# Patient Record
Sex: Male | Born: 1960
Health system: Southern US, Community
[De-identification: ages and names within clinical notes are randomized; demographics above are authoritative.]

## PROBLEM LIST (undated history)

## (undated) DIAGNOSIS — I10 Essential (primary) hypertension: Secondary | ICD-10-CM

## (undated) DIAGNOSIS — E7800 Pure hypercholesterolemia, unspecified: Secondary | ICD-10-CM

## (undated) DIAGNOSIS — E119 Type 2 diabetes mellitus without complications: Secondary | ICD-10-CM

## (undated) HISTORY — PX: EYE SURGERY: SHX253

## (undated) HISTORY — PX: TONSILLECTOMY: SUR1361

---

## 2001-07-08 ENCOUNTER — Emergency Department (HOSPITAL_COMMUNITY): Admission: EM | Admit: 2001-07-08 | Discharge: 2001-07-08 | Payer: Self-pay | Admitting: Emergency Medicine

## 2003-08-08 ENCOUNTER — Emergency Department (HOSPITAL_COMMUNITY): Admission: EM | Admit: 2003-08-08 | Discharge: 2003-08-08 | Payer: Self-pay | Admitting: Emergency Medicine

## 2012-06-23 ENCOUNTER — Emergency Department (HOSPITAL_COMMUNITY)
Admission: EM | Admit: 2012-06-23 | Discharge: 2012-06-24 | Disposition: A | Discharge status: Home / Self Care | Payer: Self-pay | Attending: Emergency Medicine | Admitting: Emergency Medicine

## 2012-06-23 DIAGNOSIS — R079 Chest pain, unspecified: Secondary | ICD-10-CM | POA: Insufficient documentation

## 2012-06-24 ENCOUNTER — Encounter (HOSPITAL_COMMUNITY): Payer: Self-pay | Admitting: Emergency Medicine

## 2012-06-24 ENCOUNTER — Emergency Department (HOSPITAL_COMMUNITY): Payer: Self-pay

## 2012-06-24 LAB — CBC WITH DIFFERENTIAL/PLATELET
Basophils Absolute: 0 10*3/uL (ref 0.0–0.1)
Basophils Relative: 0 % (ref 0–1)
Eosinophils Absolute: 0.3 10*3/uL (ref 0.0–0.7)
HCT: 37.1 % — ABNORMAL LOW (ref 39.0–52.0)
MCH: 29.3 pg (ref 26.0–34.0)
MCHC: 34.2 g/dL (ref 30.0–36.0)
Monocytes Absolute: 0.7 10*3/uL (ref 0.1–1.0)
Neutrophils Relative %: 55 % (ref 43–77)
Platelets: 230 10*3/uL (ref 150–400)
RBC: 4.34 MIL/uL (ref 4.22–5.81)

## 2012-06-24 LAB — COMPREHENSIVE METABOLIC PANEL
Alkaline Phosphatase: 77 U/L (ref 39–117)
Glucose, Bld: 98 mg/dL (ref 70–99)
Potassium: 4 mEq/L (ref 3.5–5.1)
Sodium: 138 mEq/L (ref 135–145)
Total Bilirubin: 0.4 mg/dL (ref 0.3–1.2)

## 2012-06-24 LAB — POCT I-STAT TROPONIN I: Troponin i, poc: 0 ng/mL (ref 0.00–0.08)

## 2012-06-24 LAB — PRO B NATRIURETIC PEPTIDE: Pro B Natriuretic peptide (BNP): 21.1 pg/mL (ref 0–125)

## 2012-06-24 LAB — URINALYSIS, ROUTINE W REFLEX MICROSCOPIC
Glucose, UA: NEGATIVE mg/dL
Specific Gravity, Urine: 1.026 (ref 1.005–1.030)
Urobilinogen, UA: 1 mg/dL (ref 0.0–1.0)
pH: 7.5 (ref 5.0–8.0)

## 2012-06-24 NOTE — ED Notes (Signed)
Pt states that he has had intermittent chest pressure and SOB since last night. States that it is worse when he lies down. Hypertensive. No hx of such.

## 2012-06-24 NOTE — ED Provider Notes (Signed)
ECG shows normal sinus rhythm with a rate of 75, no ectopy. Normal axis. Normal P wave. Normal QRS. Normal intervals. Normal ST and T waves. Impression: normal ECG. No prior ECG available for comparison  Medical screening examination/treatment/procedure(s) were performed by non-physician practitioner and as supervising physician I was immediately available for consultation/collaboration.  Dione Booze, MD 06/24/12 [facility]

## 2012-06-24 NOTE — ED Provider Notes (Signed)
History     CSN: [id number]  Arrival date & time 06/23/12  2348   First MD Initiated Contact with Patient 06/24/12 0020      Chief Complaint  Patient presents with  . Chest Pain    (Consider location/radiation/quality/duration/timing/severity/associated sxs/prior treatment) HPI Comments: Patient is a 52 year old otherwise healthy male who presents for left-sided chest pressure. Patient endorses 2 episodes lasting approximately 10 minutes before spontaneously resolving the first of which began last night. Patient denies any radiation of his chest pain and states that the pain was made better when sitting upright and worse when lying supine. Patient denies associated fever, jaw pain, lightheadedness or dizziness, difficulty breathing, nausea or vomiting, abdominal pain, and numbness, tingling, or weakness in his extremities. Patient denies the use of blood thinners as well is a history of hypertension, coronary artery disease, and DM. Patient endorses a family history of hypertension in his mother, diagnosed at age 89.  Patient is a 52 y.o. male presenting with chest pain. The history is provided by the patient. No language interpreter was used.  Chest Pain Associated symptoms: no dizziness, no fever, no nausea, no numbness, no shortness of breath and not vomiting     History reviewed. No pertinent past medical history.  No past surgical history on file.  No family history on file.  History  Substance Use Topics  . Smoking status: Not on file  . Smokeless tobacco: Not on file  . Alcohol Use: Not on file     Review of Systems  Constitutional: Negative for fever.  Eyes: Negative for visual disturbance.  Respiratory: Positive for chest tightness. Negative for shortness of breath.   Cardiovascular: Positive for chest pain.  Gastrointestinal: Negative for nausea and vomiting.  Neurological: Negative for dizziness, light-headedness and numbness.  All other systems reviewed and are  negative.    Allergies  Review of patient's allergies indicates no known allergies.  Home Medications  No current outpatient prescriptions on file.  BP 159/83  Pulse 66  Temp(Src) 97.9 F (36.6 C) (Oral)  Resp 24  Ht 5\' 11"  (1.803 m)  Wt 230 lb (104.327 kg)  BMI 32.09 kg/m2  SpO2 97%  Physical Exam  Nursing note and vitals reviewed. Constitutional: He is oriented to person, place, and time. He appears well-developed and well-nourished. No distress.  HENT:  Head: Normocephalic and atraumatic.  Mouth/Throat: Oropharynx is clear and moist. No oropharyngeal exudate.  Eyes: Conjunctivae and EOM are normal. Pupils are equal, round, and reactive to light. No scleral icterus.  Neck: Normal range of motion. Neck supple.  Cardiovascular: Normal rate, regular rhythm, normal heart sounds and intact distal pulses.   Pulmonary/Chest: Effort normal and breath sounds normal. No respiratory distress. He has no wheezes. He has no rales.  Pain not reproducible on palpation  Abdominal: Soft. He exhibits no distension. There is no tenderness. There is no rebound and no guarding.  Musculoskeletal: Normal range of motion. He exhibits no edema.  Neurological: He is alert and oriented to person, place, and time.  Skin: Skin is warm and dry. No rash noted. He is not diaphoretic. No erythema.  Psychiatric: He has a normal mood and affect. His behavior is normal.    ED Course  Procedures (including critical care time)  Labs Reviewed  COMPREHENSIVE METABOLIC PANEL - Abnormal; Notable for the following:    Albumin 3.3 (*)    GFR calc non Af Amer 65 (*)    GFR calc Af Amer 75 (*)  All other components within normal limits  URINALYSIS, ROUTINE W REFLEX MICROSCOPIC - Abnormal; Notable for the following:    APPearance CLOUDY (*)    All other components within normal limits  CBC WITH DIFFERENTIAL - Abnormal; Notable for the following:    Hemoglobin 12.7 (*)    HCT 37.1 (*)    All other components  within normal limits  PRO B NATRIURETIC PEPTIDE  POCT I-STAT TROPONIN I  POCT I-STAT TROPONIN I   Dg Chest 2 View  06/24/2012   *RADIOLOGY REPORT*  Clinical Data: Chest pain  CHEST - 2 VIEW  Comparison: None.  Findings: Normal heart, mediastinal, and hilar contours.  Normal pulmonary vascularity.  Midline trachea.  The lungs are normally expanded and clear.  Negative for pleural effusion or pneumothorax. No acute osseous abnormality.  IMPRESSION: No acute cardiopulmonary disease.   Original Report Authenticated By: Britta Mccreedy, M.D.    Date: 06/24/2012  Rate: 75  Rhythm: normal sinus rhythm  QRS Axis: normal  Intervals: normal  ST/T Wave abnormalities: normal  Conduction Disutrbances:none  Narrative Interpretation: NSR without STEMI or ischemic changes.  Old EKG Reviewed: none available I have personally reviewed and interpreted this EKG.   1. Chest pain     MDM  52 y/o male who presents for chest pain. Otherwise healthy and not on any daily medications. Patient asymptomatic on initial presentation and remained asymptomatic during entirety of ED course. EKG without STEMI or ischemic changes and troponin 0.00 x 2. There is no leukocytosis, significant anemia, or electrolyte imbalance. Kidney and liver function preserved. BNP not elevated and CXR without evidence of PNA, PTX, pleural effusion, or other cardiopulmonary abnormality.   Low suspicion for ACS given work up in ED today, lack of chronic medical conditions, and atypical nature of symptoms. Patient has TIMI score of 0 correlated with 5% risk of ACS. Also doubt PE given lack of tachycardia, tachypnea, hypoxia, or risk factors; Well's PE score 0. Believe patient appropriate for d/c with PCP and cardiology follow up for further evaluation of symptoms given lack of evidence to suspect emergent process. Indications for ED return discussed and have also reviewed all lab results with the patient. Patient verbalizes comfort and understanding  with plan with no unaddressed concerns.        Antony Madura, PA-C 06/24/12 1948

## 2012-06-24 NOTE — ED Provider Notes (Signed)
Medical screening examination/treatment/procedure(s) were performed by non-physician practitioner and as supervising physician I was immediately available for consultation/collaboration.  Ahaana Rochette, MD 06/24/12 2253 

## 2013-10-13 ENCOUNTER — Telehealth: Payer: Self-pay | Admitting: Family Medicine

## 2013-10-13 NOTE — Telephone Encounter (Signed)
lm

## 2013-11-07 ENCOUNTER — Encounter: Payer: Self-pay | Admitting: Family Medicine

## 2014-12-01 ENCOUNTER — Emergency Department (HOSPITAL_COMMUNITY)
Admission: EM | Admit: 2014-12-01 | Discharge: 2014-12-01 | Disposition: A | Discharge status: Home / Self Care | Payer: Self-pay | Attending: Emergency Medicine | Admitting: Emergency Medicine

## 2014-12-01 ENCOUNTER — Encounter (HOSPITAL_COMMUNITY): Payer: Self-pay | Admitting: Emergency Medicine

## 2014-12-01 DIAGNOSIS — R141 Gas pain: Secondary | ICD-10-CM | POA: Insufficient documentation

## 2014-12-01 DIAGNOSIS — R197 Diarrhea, unspecified: Secondary | ICD-10-CM | POA: Insufficient documentation

## 2014-12-01 DIAGNOSIS — R112 Nausea with vomiting, unspecified: Secondary | ICD-10-CM | POA: Insufficient documentation

## 2014-12-01 LAB — CBC
HEMATOCRIT: 46.2 % (ref 39.0–52.0)
Hemoglobin: 16 g/dL (ref 13.0–17.0)
MCH: 30.5 pg (ref 26.0–34.0)
MCHC: 34.6 g/dL (ref 30.0–36.0)
MCV: 88.2 fL (ref 78.0–100.0)
PLATELETS: 265 10*3/uL (ref 150–400)
RBC: 5.24 MIL/uL (ref 4.22–5.81)
RDW: 13 % (ref 11.5–15.5)
WBC: 12.7 10*3/uL — AB (ref 4.0–10.5)

## 2014-12-01 LAB — URINALYSIS, ROUTINE W REFLEX MICROSCOPIC
BILIRUBIN URINE: NEGATIVE
Glucose, UA: NEGATIVE mg/dL
Hgb urine dipstick: NEGATIVE
KETONES UR: NEGATIVE mg/dL
LEUKOCYTES UA: NEGATIVE
NITRITE: NEGATIVE
PROTEIN: 30 mg/dL — AB
Specific Gravity, Urine: 1.03 (ref 1.005–1.030)
pH: 5.5 (ref 5.0–8.0)

## 2014-12-01 LAB — COMPREHENSIVE METABOLIC PANEL
ALBUMIN: 4.5 g/dL (ref 3.5–5.0)
ALK PHOS: 78 U/L (ref 38–126)
ALT: 32 U/L (ref 17–63)
AST: 28 U/L (ref 15–41)
Anion gap: 10 (ref 5–15)
BILIRUBIN TOTAL: 0.9 mg/dL (ref 0.3–1.2)
BUN: 18 mg/dL (ref 6–20)
CALCIUM: 9.7 mg/dL (ref 8.9–10.3)
CO2: 25 mmol/L (ref 22–32)
Chloride: 103 mmol/L (ref 101–111)
Creatinine, Ser: 1.41 mg/dL — ABNORMAL HIGH (ref 0.61–1.24)
GFR calc Af Amer: >60 mL/min (ref >60)
GFR, EST NON AFRICAN AMERICAN: 54 mL/min — AB (ref >60)
GLUCOSE: 116 mg/dL — AB (ref 65–99)
POTASSIUM: 3.7 mmol/L (ref 3.5–5.1)
Sodium: 138 mmol/L (ref 135–145)
TOTAL PROTEIN: 8.7 g/dL — AB (ref 6.5–8.1)

## 2014-12-01 LAB — URINE MICROSCOPIC-ADD ON

## 2014-12-01 LAB — LIPASE, BLOOD: Lipase: 30 U/L (ref 11–51)

## 2014-12-01 MED ORDER — ONDANSETRON HCL 4 MG/2ML IJ SOLN: 4.0000 mg | Freq: Once | INTRAMUSCULAR | Status: DC

## 2014-12-01 MED ORDER — SODIUM CHLORIDE 0.9 % IV SOLN: Freq: Once | INTRAVENOUS | Status: DC

## 2014-12-01 MED ORDER — DICYCLOMINE HCL 10 MG/ML IM SOLN
20.0000 mg | Freq: Once | INTRAMUSCULAR | Status: AC
  Administered 2014-12-01: 20 mg via INTRAMUSCULAR
  Filled 2014-12-01: qty 2

## 2014-12-01 MED ORDER — ONDANSETRON 8 MG PO TBDP: ORAL_TABLET | ORAL | Status: DC

## 2014-12-01 MED ORDER — ONDANSETRON HCL 4 MG/2ML IJ SOLN
4.0000 mg | Freq: Once | INTRAMUSCULAR | Status: AC
  Administered 2014-12-01: 4 mg via INTRAVENOUS
  Filled 2014-12-01: qty 2

## 2014-12-01 MED ORDER — SODIUM CHLORIDE 0.9 % IV BOLUS (SEPSIS)
500.0000 mL | Freq: Once | INTRAVENOUS | Status: AC
  Administered 2014-12-01: 500 mL via INTRAVENOUS

## 2014-12-01 NOTE — ED Notes (Signed)
Patient presents stating he started with N/V about 5 hours ago and last had any vomiting approx 1 hour PTA.  States he was working and all of a sudden started with abd pain.  States the pain is in the middle of his abd.

## 2014-12-01 NOTE — ED Notes (Signed)
Reviewed d/c instructions with pt, who voiced understanding. Pt departed under his own power and in NAD.  

## 2014-12-01 NOTE — ED Notes (Signed)
Patient arrived to room.

## 2014-12-01 NOTE — ED Notes (Signed)
Pt. reports mid abdominal pain with nausea , emesis and diarrhea onset this evening , denies fever or chills .

## 2014-12-01 NOTE — ED Notes (Signed)
Patient denies urinary symptoms.

## 2014-12-01 NOTE — ED Provider Notes (Signed)
CSN: [id number]     Arrival date & time 12/01/14  0050 History  By signing my name below, I, Phillis Haggis, attest that this documentation has been prepared under the direction and in the presence of Anum Palecek, MD. Electronically Signed: Phillis Haggis, ED Scribe. 12/01/2014. 2:01 AM.   Chief Complaint  Patient presents with  . Abdominal Pain   Patient is a 54 y.o. male presenting with abdominal pain. The history is provided by the patient. No language interpreter was used.  Abdominal Pain Pain location: middle. Pain quality: cramping   Pain radiates to:  Does not radiate Pain severity:  Mild Onset quality:  Sudden Duration:  5 hours Timing:  Constant Progression:  Worsening Chronicity:  New Context: not diet changes and not eating   Relieved by:  Nothing Worsened by:  Nothing tried Ineffective treatments:  None tried Associated symptoms: diarrhea, nausea and vomiting   Associated symptoms: no chest pain, no chills, no cough, no fever and no shortness of breath   Risk factors: no alcohol abuse   HPI Comments: Johnson Arizola is a 54 y.o. Male who presents to the Emergency Department complaining of constant, gradually worsening, non-radiating middle abdominal pain onset 5 hours ago. Pt reports associated nausea, vomiting x5 and diarrhea x5. He states that his last episodes of vomiting and diarrhea were at 11:30 PM. He denies sick contacts. He denies fever or chills.   History reviewed. No pertinent past medical history. Past Surgical History  Procedure Laterality Date  . Tonsillectomy    . Eye surgery     No family history on file. Social History  Substance Use Topics  . Smoking status: Never Smoker   . Smokeless tobacco: None  . Alcohol Use: No    Review of Systems  Constitutional: Negative for fever and chills.  Respiratory: Negative for cough and shortness of breath.   Cardiovascular: Negative for chest pain.  Gastrointestinal: Positive for nausea, vomiting,  abdominal pain and diarrhea. Negative for anal bleeding.  All other systems reviewed and are negative.   Allergies  Review of patient's allergies indicates no known allergies.  Home Medications   Prior to Admission medications   Not on File   BP 165/102 mmHg  Pulse 100  Temp(Src) 98.3 F (36.8 C) (Oral)  Resp 16  SpO2 100% Physical Exam  Constitutional: He is oriented to person, place, and time. He appears well-developed and well-nourished.  HENT:  Head: Normocephalic and atraumatic.  Mouth/Throat: Oropharynx is clear and moist. No oropharyngeal exudate.  Eyes: EOM are normal. Pupils are equal, round, and reactive to light.  Neck: Normal range of motion. Neck supple.  Cardiovascular: Normal rate, regular rhythm and normal heart sounds.  Exam reveals no gallop and no friction rub.   No murmur heard. Pulmonary/Chest: Effort normal and breath sounds normal. He has no wheezes. He has no rales.  Abdominal: Soft. He exhibits no distension and no mass. There is no tenderness. There is no rebound and no guarding.  Gassy  Musculoskeletal: Normal range of motion.  Neurological: He is alert and oriented to person, place, and time. He has normal reflexes.  Skin: Skin is warm and dry.  Psychiatric: He has a normal mood and affect. His behavior is normal.  Nursing note and vitals reviewed.   ED Course  Procedures (including critical care time) DIAGNOSTIC STUDIES: Oxygen Saturation is 100% on RA, by my interpretation.    COORDINATION OF CARE: 1:18 AM-Discussed treatment plan which includes labs with pt at bedside  and pt agreed to plan.   Labs Review Labs Reviewed  COMPREHENSIVE METABOLIC PANEL - Abnormal; Notable for the following:    Glucose, Bld 116 (*)    Creatinine, Ser 1.41 (*)    Total Protein 8.7 (*)    GFR calc non Af Amer 54 (*)    All other components within normal limits  CBC - Abnormal; Notable for the following:    WBC 12.7 (*)    All other components within  normal limits  URINALYSIS, ROUTINE W REFLEX MICROSCOPIC (NOT AT [facility]) - Abnormal; Notable for the following:    Protein, ur 30 (*)    All other components within normal limits  URINE MICROSCOPIC-ADD ON - Abnormal; Notable for the following:    Squamous Epithelial / LPF 0-5 (*)    Bacteria, UA RARE (*)    All other components within normal limits  LIPASE, BLOOD    Imaging Review No results found. I have personally reviewed and evaluated these images and lab results as part of my medical decision-making.   EKG Interpretation None      MDM   Final diagnoses:  None    Symptoms consistent with viral n/v/d.  Exam and vitals are benign and reassuring.  Strict return precautions given.  Hand hygine and follow up in 2 days with your PMD  I personally performed the services described in this documentation, which was scribed in my presence. The recorded information has been reviewed and is accurate.      Cy BlamerApril Daniyal Tabor, MD 12/01/14 [phone]

## 2015-02-12 ENCOUNTER — Ambulatory Visit: Payer: Self-pay | Admitting: Internal Medicine

## 2015-03-04 ENCOUNTER — Ambulatory Visit: Payer: Self-pay | Attending: Family Medicine | Admitting: Family Medicine

## 2015-03-04 ENCOUNTER — Encounter: Payer: Self-pay | Admitting: Family Medicine

## 2015-03-04 VITALS — BP 170/110 | HR 86 | Temp 97.4°F | Resp 16 | Ht 66.0 in | Wt 273.4 lb

## 2015-03-04 DIAGNOSIS — Z79899 Other long term (current) drug therapy: Secondary | ICD-10-CM | POA: Insufficient documentation

## 2015-03-04 DIAGNOSIS — IMO0001 Reserved for inherently not codable concepts without codable children: Secondary | ICD-10-CM

## 2015-03-04 DIAGNOSIS — M545 Low back pain: Secondary | ICD-10-CM | POA: Insufficient documentation

## 2015-03-04 DIAGNOSIS — M62830 Muscle spasm of back: Secondary | ICD-10-CM | POA: Insufficient documentation

## 2015-03-04 DIAGNOSIS — I10 Essential (primary) hypertension: Secondary | ICD-10-CM | POA: Insufficient documentation

## 2015-03-04 DIAGNOSIS — R030 Elevated blood-pressure reading, without diagnosis of hypertension: Secondary | ICD-10-CM | POA: Insufficient documentation

## 2015-03-04 MED ORDER — TIZANIDINE HCL 4 MG PO TABS: 4.0000 mg | ORAL_TABLET | Freq: Three times a day (TID) | ORAL | Status: DC | PRN

## 2015-03-04 NOTE — Progress Notes (Signed)
Patient reports back pain only when he walks distances and then his lower back hurts-it is non-radiating He takes no medications at this time

## 2015-03-04 NOTE — Patient Instructions (Signed)
Hypertension Hypertension, commonly called high blood pressure, is when the force of blood pumping through your arteries is too strong. Your arteries are the blood vessels that carry blood from your heart throughout your body. A blood pressure reading consists of a higher number over a lower number, such as 110/72. The higher number (systolic) is the pressure inside your arteries when your heart pumps. The lower number (diastolic) is the pressure inside your arteries when your heart relaxes. Ideally you want your blood pressure below 120/80. Hypertension forces your heart to work harder to pump blood. Your arteries may become narrow or stiff. Having untreated or uncontrolled hypertension can cause heart attack, stroke, kidney disease, and other problems. RISK FACTORS Some risk factors for high blood pressure are controllable. Others are not.  Risk factors you cannot control include:   Race. You may be at higher risk if you are African American.  Age. Risk increases with age.  Gender. Men are at higher risk than women before age 45 years. After age 65, women are at higher risk than men. Risk factors you can control include:  Not getting enough exercise or physical activity.  Being overweight.  Getting too much fat, sugar, calories, or salt in your diet.  Drinking too much alcohol. SIGNS AND SYMPTOMS Hypertension does not usually cause signs or symptoms. Extremely high blood pressure (hypertensive crisis) may cause headache, anxiety, shortness of breath, and nosebleed. DIAGNOSIS To check if you have hypertension, your health care provider will measure your blood pressure while you are seated, with your arm held at the level of your heart. It should be measured at least twice using the same arm. Certain conditions can cause a difference in blood pressure between your right and left arms. A blood pressure reading that is higher than normal on one occasion does not mean that you need treatment. If  it is not clear whether you have high blood pressure, you may be asked to return on a different day to have your blood pressure checked again. Or, you may be asked to monitor your blood pressure at home for 1 or more weeks. TREATMENT Treating high blood pressure includes making lifestyle changes and possibly taking medicine. Living a healthy lifestyle can help lower high blood pressure. You may need to change some of your habits. Lifestyle changes may include:  Following the DASH diet. This diet is high in fruits, vegetables, and whole grains. It is low in salt, red meat, and added sugars.  Keep your sodium intake below 2,300 mg per day.  Getting at least 30-45 minutes of aerobic exercise at least 4 times per week.  Losing weight if necessary.  Not smoking.  Limiting alcoholic beverages.  Learning ways to reduce stress. Your health care provider may prescribe medicine if lifestyle changes are not enough to get your blood pressure under control, and if one of the following is true:  You are 18-59 years of age and your systolic blood pressure is above 140.  You are 60 years of age or older, and your systolic blood pressure is above 150.  Your diastolic blood pressure is above 90.  You have diabetes, and your systolic blood pressure is over 140 or your diastolic blood pressure is over 90.  You have kidney disease and your blood pressure is above 140/90.  You have heart disease and your blood pressure is above 140/90. Your personal target blood pressure may vary depending on your medical conditions, your age, and other factors. HOME CARE INSTRUCTIONS    Have your blood pressure rechecked as directed by your health care provider.   Take medicines only as directed by your health care provider. Follow the directions carefully. Blood pressure medicines must be taken as prescribed. The medicine does not work as well when you skip doses. Skipping doses also puts you at risk for  problems.  Do not smoke.   Monitor your blood pressure at home as directed by your health care provider. SEEK MEDICAL CARE IF:   You think you are having a reaction to medicines taken.  You have recurrent headaches or feel dizzy.  You have swelling in your ankles.  You have trouble with your vision. SEEK IMMEDIATE MEDICAL CARE IF:  You develop a severe headache or confusion.  You have unusual weakness, numbness, or feel faint.  You have severe chest or abdominal pain.  You vomit repeatedly.  You have trouble breathing. MAKE SURE YOU:   Understand these instructions.  Will watch your condition.  Will get help right away if you are not doing well or get worse.   This information is not intended to replace advice given to you by your health care provider. Make sure you discuss any questions you have with your health care provider.   Document Released: 12/26/2004 Document Revised: 05/12/2014 Document Reviewed: 10/18/2012 Elsevier Interactive Patient Education 2016 [employer]  

## 2015-03-05 NOTE — Progress Notes (Signed)
   Subjective:  Patient ID: Lonnie Brown, male    DOB: 04-Jul-1960  Age: 55 y.o. MRN: [id number]  CC: Establish Care   HPI Lonnie Brown is a 55 year old male who presents with intermittent low back pain since 2012 with no history of trauma which is aggravated by walking and relieved by sitting down; he denies radiation of pain to his lower extremity and has no numbness or weakness in his legs and denies any loss of sphincteric function. Pain occurs on bilateral aspects of his lower back.  His blood pressure is elevated today and he denies a history of hypertension and has not been on any antihypertensives in the past. Denies chest pain or shortness of breath.  Outpatient Prescriptions Prior to Visit  Medication Sig Dispense Refill  . ondansetron (ZOFRAN ODT) 8 MG disintegrating tablet  ODT q8 hours prn nausea 4 tablet 0   No facility-administered medications prior to visit.    ROS Review of Systems  Constitutional: Negative for activity change and appetite change.  HENT: Negative for sinus pressure and sore throat.   Respiratory: Negative for chest tightness, shortness of breath and wheezing.   Cardiovascular: Negative for chest pain and palpitations.  Gastrointestinal: Negative for abdominal pain, constipation and abdominal distention.  Genitourinary: Negative.   Musculoskeletal: Positive for back pain. Negative for gait problem.  Psychiatric/Behavioral: Negative for behavioral problems and dysphoric mood.    Objective:  BP 170/110 mmHg  Pulse 86  Temp(Src) 97.4 F (36.3 C)  Resp 16  Ht  (1.676 m)  Wt 273 lb 6.4 oz (124.013 kg)  BMI 44.15 kg/m2  SpO2 95%  BP/Weight 03/04/2015 12/01/2014 06/24/2012  Systolic BP 170 159 154  Diastolic BP 110 107 89  Wt. (Lbs) 273.4 - -  BMI 44.15 - -      Physical Exam  Constitutional: He is oriented to person, place, and time. He appears well-developed and well-nourished.  Cardiovascular: Normal rate, normal heart  sounds and intact distal pulses.   No murmur heard. Pulmonary/Chest: Effort normal and breath sounds normal. He has no wheezes. He has no rales. He exhibits no tenderness.  Abdominal: Soft. Bowel sounds are normal. He exhibits no distension and no mass. There is no tenderness.  Musculoskeletal: He exhibits tenderness (tenderness on palpation of bilateral lumbar paraspinal region).  Neurological: He is alert and oriented to person, place, and time.     Assessment & Plan:   1. Elevated blood pressure Uncontrolled. Advised on lifestyle modification and I will review his blood pressure at his next visit  2. Muscle spasm of back Advised to apply heat and stretching exercises demonstrated. - tiZANidine (ZANAFLEX) 4 MG tablet; Take 1 tablet (4 mg total) by mouth every 8 (eight) hours as needed for muscle spasms.  Dispense: 90 tablet; Refill: 1   Meds ordered this encounter  Medications  . tiZANidine (ZANAFLEX) 4 MG tablet    Sig: Take 1 tablet (4 mg total) by mouth every 8 (eight) hours as needed for muscle spasms.    Dispense:  90 tablet    Refill:  1    Follow-up: Return in about 1 week (around 03/11/2015) for Follow-up on elevated blood pressure and back pain.   Jaclyn Shaggy MD

## 2015-04-09 ENCOUNTER — Ambulatory Visit: Payer: Self-pay | Attending: Family Medicine | Admitting: Family Medicine

## 2015-04-09 ENCOUNTER — Encounter: Payer: Self-pay | Admitting: Family Medicine

## 2015-04-09 VITALS — BP 150/90 | HR 86 | Temp 98.2°F | Resp 16 | Ht 71.0 in | Wt 282.0 lb

## 2015-04-09 DIAGNOSIS — M7661 Achilles tendinitis, right leg: Secondary | ICD-10-CM | POA: Insufficient documentation

## 2015-04-09 DIAGNOSIS — M7662 Achilles tendinitis, left leg: Secondary | ICD-10-CM | POA: Insufficient documentation

## 2015-04-09 DIAGNOSIS — Z79899 Other long term (current) drug therapy: Secondary | ICD-10-CM | POA: Insufficient documentation

## 2015-04-09 DIAGNOSIS — I10 Essential (primary) hypertension: Secondary | ICD-10-CM | POA: Insufficient documentation

## 2015-04-09 DIAGNOSIS — M7660 Achilles tendinitis, unspecified leg: Secondary | ICD-10-CM | POA: Insufficient documentation

## 2015-04-09 MED ORDER — NAPROXEN 500 MG PO TABS: 500.0000 mg | ORAL_TABLET | Freq: Two times a day (BID) | ORAL | Status: DC

## 2015-04-09 MED ORDER — LISINOPRIL-HYDROCHLOROTHIAZIDE 10-12.5 MG PO TABS: 1.0000 | ORAL_TABLET | Freq: Every day | ORAL | Status: DC

## 2015-04-09 MED FILL — NAPROXEN 500 MG TABLET: 500 | 30 days supply | Qty: 30 | Fill #0

## 2015-04-09 MED FILL — LISINOPRIL-HCTZ 10-12.5 MG: 10-12.5 | 30 days supply | Qty: 30 | Fill #0

## 2015-04-09 MED FILL — tiZANidine HCL 4 MG TABS: 4 | 30 days supply | Qty: 90 | Fill #0

## 2015-04-09 NOTE — Progress Notes (Signed)
F/u Lt ankle  Stated Rt ankle better  Difficulty to walk  Pain scale #0  No suicidal thoughts in the past two weeks No tobacco user

## 2015-04-12 NOTE — Progress Notes (Signed)
   Subjective:  Patient ID: Lonnie Brown, male    DOB: [birth year]  Age: 55 y.o. MRN: [id number]  CC: Follow-up   HPI Lonnie PoundsMichael Brown presents for follow-up of elevated blood pressure after working on lifestyle changes and blood pressure still remains elevated. He also complains of bilateral ankle pain worse on the left and located in the posterior ankle. This sometimes affects his ambulation but he denies history of trauma and has no swelling. The back pain he complained of at his last visit has subsided.  Outpatient Prescriptions Prior to Visit  Medication Sig Dispense Refill  . tiZANidine (ZANAFLEX) 4 MG tablet Take 1 tablet (4 mg total) by mouth every 8 (eight) hours as needed for muscle spasms. 90 tablet 1   No facility-administered medications prior to visit.    ROS Review of Systems  Constitutional: Negative for activity change and appetite change.  HENT: Negative for sinus pressure and sore throat.   Respiratory: Negative for chest tightness, shortness of breath and wheezing.   Cardiovascular: Negative for chest pain and palpitations.  Gastrointestinal: Negative for abdominal pain, constipation and abdominal distention.  Genitourinary: Negative.   Musculoskeletal:       See hpi  Psychiatric/Behavioral: Negative for behavioral problems and dysphoric mood.    Objective:  BP 150/90 mmHg  Pulse 86  Temp(Src) 98.2 F (36.8 C) (Oral)  Resp 16  Ht 5\' 11"  (1.803 m)  Wt 282 lb (127.914 kg)  BMI 39.35 kg/m2  SpO2 97%  BP/Weight 04/09/2015 03/04/2015 12/01/2014  Systolic BP 150 170 159  Diastolic BP 90 110 107  Wt. (Lbs) 282 273.4 -  BMI 39.35 44.15 -      Physical Exam  Constitutional: He is oriented to person, place, and time. He appears well-developed and well-nourished.  Cardiovascular: Normal rate, normal heart sounds and intact distal pulses.   No murmur heard. Pulmonary/Chest: Effort normal and breath sounds normal. He has no wheezes. He has no rales. He  exhibits no tenderness.  Abdominal: Soft. Bowel sounds are normal. He exhibits no distension and no mass. There is no tenderness.  Musculoskeletal: Normal range of motion. He exhibits tenderness (mild tenderness on palpation of left achilles tendon and on dorsiflexion).  Neurological: He is alert and oriented to person, place, and time.     Assessment & Plan:   1. Essential hypertension Lifestyle modification ineffective so far Will commence antihypertensive - lisinopril-hydrochlorothiazide (PRINZIDE,ZESTORETIC) 10-12.5 MG tablet; Take 1 tablet by mouth daily.  Dispense: 30 tablet; Refill: 2  2. Achilles tendinitis of both lower extremities - naproxen (NAPROSYN) 500 MG tablet; Take 1 tablet (500 mg total) by mouth 2 (two) times daily with a meal.  Dispense: 30 tablet; Refill: 2   Meds ordered this encounter  Medications  . naproxen (NAPROSYN) 500 MG tablet    Sig: Take 1 tablet (500 mg total) by mouth 2 (two) times daily with a meal.    Dispense:  30 tablet    Refill:  2  . lisinopril-hydrochlorothiazide (PRINZIDE,ZESTORETIC) 10-12.5 MG tablet    Sig: Take 1 tablet by mouth daily.    Dispense:  30 tablet    Refill:  2    Follow-up: Return in about 2 weeks (around 04/23/2015) for follow up on hypertension and achilles tendinitis.   Jaclyn ShaggyEnobong Amao MD

## 2015-04-19 ENCOUNTER — Encounter (HOSPITAL_COMMUNITY): Payer: Self-pay | Admitting: *Deleted

## 2015-04-19 ENCOUNTER — Emergency Department (HOSPITAL_COMMUNITY)
Admission: EM | Admit: 2015-04-19 | Discharge: 2015-04-19 | Disposition: A | Discharge status: Home / Self Care | Payer: Self-pay | Attending: Emergency Medicine | Admitting: Emergency Medicine

## 2015-04-19 ENCOUNTER — Emergency Department (HOSPITAL_COMMUNITY): Payer: Self-pay

## 2015-04-19 DIAGNOSIS — Z791 Long term (current) use of non-steroidal anti-inflammatories (NSAID): Secondary | ICD-10-CM | POA: Insufficient documentation

## 2015-04-19 DIAGNOSIS — Z79899 Other long term (current) drug therapy: Secondary | ICD-10-CM | POA: Insufficient documentation

## 2015-04-19 DIAGNOSIS — R0602 Shortness of breath: Secondary | ICD-10-CM | POA: Insufficient documentation

## 2015-04-19 LAB — CBC
HEMATOCRIT: 38.3 % — AB (ref 39.0–52.0)
HEMOGLOBIN: 13.1 g/dL (ref 13.0–17.0)
MCH: 29.6 pg (ref 26.0–34.0)
MCHC: 34.2 g/dL (ref 30.0–36.0)
MCV: 86.7 fL (ref 78.0–100.0)
Platelets: 229 10*3/uL (ref 150–400)
RBC: 4.42 MIL/uL (ref 4.22–5.81)
RDW: 12.7 % (ref 11.5–15.5)
WBC: 6.9 10*3/uL (ref 4.0–10.5)

## 2015-04-19 LAB — BASIC METABOLIC PANEL
ANION GAP: 7 (ref 5–15)
BUN: 15 mg/dL (ref 6–20)
CO2: 26 mmol/L (ref 22–32)
Calcium: 9 mg/dL (ref 8.9–10.3)
Chloride: 103 mmol/L (ref 101–111)
Creatinine, Ser: 1.45 mg/dL — ABNORMAL HIGH (ref 0.61–1.24)
GFR, EST NON AFRICAN AMERICAN: 52 mL/min — AB (ref >60)
GLUCOSE: 111 mg/dL — AB (ref 65–99)
Potassium: 4.2 mmol/L (ref 3.5–5.1)
SODIUM: 136 mmol/L (ref 135–145)

## 2015-04-19 LAB — I-STAT TROPONIN, ED: Troponin i, poc: 0 ng/mL (ref 0.00–0.08)

## 2015-04-19 MED ORDER — IPRATROPIUM-ALBUTEROL 0.5-2.5 (3) MG/3ML IN SOLN
3.0000 mL | Freq: Once | RESPIRATORY_TRACT | Status: AC
  Administered 2015-04-19: 3 mL via RESPIRATORY_TRACT
  Filled 2015-04-19: qty 3

## 2015-04-19 NOTE — ED Provider Notes (Signed)
CSN: [id number]     Arrival date & time 04/19/15  [id number] History   First MD Initiated Contact with Patient 04/19/15 [phone]     Chief Complaint  Patient presents with  . Shortness of Breath    HPI   54 from [place] presents today with complaints of shortness of breath. Patient reports that starting last night when laying back he's had shortness of breath. He notes that upon sitting up standing and ambulating he feels normal with no chest pain, shortness of breath, diaphoresis. Patient reports he is able to lay back approximately 45 with no discomfort, but notes symptom onset with lying flat. Patient denies any history of the same, denies any cardiac history, denies any history of DVT or PE, heart failure, or any other significant health conditions. Patient denies any lower extremity swelling or edema, edema to his hands face mouth or throat. Patient denies any recent infectious illness, denies fever, chills, nausea, vomiting, cough. Patient reports he's never smoked, no history of hyperlipidemia, nondiabetic.   History reviewed. No pertinent past medical history. Past Surgical History  Procedure Laterality Date  . Tonsillectomy    . Eye surgery     Family History  Problem Relation Age of Onset  . Diabetes Mother   . Hypertension Mother    Social History  Substance Use Topics  . Smoking status: Never Smoker   . Smokeless tobacco: None  . Alcohol Use: No    Review of Systems  All other systems reviewed and are negative.   Allergies  Banana  Home Medications   Prior to Admission medications   Medication Sig Start Date End Date Taking? Authorizing Provider  lisinopril-hydrochlorothiazide (PRINZIDE,ZESTORETIC) 10-12.5 MG tablet Take 1 tablet by mouth daily. 04/09/15  Yes Jaclyn ShaggyEnobong Amao, MD  naproxen (NAPROSYN) 500 MG tablet Take 1 tablet (500 mg total) by mouth 2 (two) times daily with a meal. 04/09/15  Yes Jaclyn ShaggyEnobong Amao, MD  tiZANidine (ZANAFLEX) 4 MG tablet Take 1 tablet (4 mg total) by  mouth every 8 (eight) hours as needed for muscle spasms. 03/04/15  Yes Jaclyn ShaggyEnobong Amao, MD   BP 146/95 mmHg  Pulse 76  Temp(Src) 98.1 F (36.7 C) (Oral)  Resp 20  Ht 5\' 11"  (1.803 m)  Wt 127.007 kg  BMI 39.07 kg/m2  SpO2 99%   Physical Exam  Constitutional: He is oriented to person, place, and time. He appears well-developed and well-nourished.  HENT:  Head: Normocephalic and atraumatic.  Eyes: Conjunctivae are normal. Pupils are equal, round, and reactive to light. Right eye exhibits no discharge. Left eye exhibits no discharge. No scleral icterus.  Neck: Normal range of motion. No JVD present. No tracheal deviation present.  Cardiovascular: Normal rate, regular rhythm, normal heart sounds and intact distal pulses.  Exam reveals no gallop and no friction rub.   No murmur heard. Pulmonary/Chest: Effort normal and breath sounds normal. No stridor. No respiratory distress. He has no wheezes. He has no rales. He exhibits no tenderness.  Abdominal: Soft. He exhibits no distension and no mass. There is no tenderness. There is no rebound and no guarding.  Musculoskeletal:  Scant lower extremity edema bilaterally  Neurological: He is alert and oriented to person, place, and time. Coordination normal.  Skin: Skin is warm and dry. No rash noted. No erythema. No pallor.  Psychiatric: He has a normal mood and affect. His behavior is normal. Judgment and thought content normal.  Nursing note and vitals reviewed.   ED Course  Procedures (including critical  care time) Labs Review Labs Reviewed  BASIC METABOLIC PANEL - Abnormal; Notable for the following:    Glucose, Bld 111 (*)    Creatinine, Ser 1.45 (*)    GFR calc non Af Amer 52 (*)    All other components within normal limits  CBC - Abnormal; Notable for the following:    HCT 38.3 (*)    All other components within normal limits  I-STAT TROPOININ, ED    Imaging Review Dg Chest 2 View  04/19/2015  CLINICAL DATA:  Shortness of breath  when laying down for 3 days EXAM: CHEST  2 VIEW COMPARISON:  06/24/2012 FINDINGS: The heart size and mediastinal contours are within normal limits. Both lungs are clear. The visualized skeletal structures are unremarkable. IMPRESSION: No active cardiopulmonary disease. Electronically Signed   By: Charlett Nose M.D.   On: 04/19/2015 07:23   I have personally reviewed and evaluated these images and lab results as part of my medical decision-making.   EKG Interpretation   Date/Time:  Monday April 19 2015 16:10:96 EDT Ventricular Rate:  76 PR Interval:  168 QRS Duration: 108 QT Interval:  382 QTC Calculation: 429 R Axis:   -20 Text Interpretation:  Normal sinus rhythm Inferior infarct , age  undetermined Possible Anterior infarct , age undetermined Abnormal ECG No  significant change since last tracing in 2014 Confirmed by WARD,  DO,  KRISTEN ([id number]) on 04/19/2015 6:45:06 AM      MDM   Final diagnoses:  Shortness of breath    Labs: i-STAT troponin, BMP, CBC  Imaging: DG chest 2 view  Consults:  Therapeutics:  Discharge Meds:   Assessment/Plan:55 year old male presents today with shortness of breath. Patient has no appreciable shortness of breath on my exam. I was unable to reproduce patient's symptoms here in the ED even with lying flat. Patient was given a breathing treatment. Patient has no signs of heart failure, ACS, dissection, pulmonary embolism, or any other significant injury life-threatening illnesses. Patient's symptoms could likely be a result from body habitus, indigestion. Patient will be instructed to follow-up with his primary care for reevaluation of symptoms return, return to the emergency room if any concerning signs or symptoms present. Patient verbalizes understanding and agreement to today's plan had no further questions or concerns at time of discharge        Eyvonne Mechanic, PA-C 04/19/15 0454  Azalia Bilis, MD 04/21/15 [phone]

## 2015-04-19 NOTE — ED Notes (Signed)
Pt c/o sob all night when he lay down. No chest pain  Alert no distress

## 2015-04-19 NOTE — Discharge Instructions (Signed)

## 2015-04-26 ENCOUNTER — Ambulatory Visit: Payer: Self-pay | Admitting: Family Medicine

## 2015-05-03 ENCOUNTER — Encounter: Payer: Self-pay | Admitting: Family Medicine

## 2015-05-03 ENCOUNTER — Ambulatory Visit: Payer: Self-pay | Attending: Family Medicine | Admitting: Family Medicine

## 2015-05-03 VITALS — BP 142/91 | HR 76 | Temp 97.9°F | Resp 16 | Ht 71.0 in | Wt 286.4 lb

## 2015-05-03 DIAGNOSIS — M7661 Achilles tendinitis, right leg: Secondary | ICD-10-CM | POA: Insufficient documentation

## 2015-05-03 DIAGNOSIS — M7662 Achilles tendinitis, left leg: Secondary | ICD-10-CM | POA: Insufficient documentation

## 2015-05-03 DIAGNOSIS — M62830 Muscle spasm of back: Secondary | ICD-10-CM | POA: Insufficient documentation

## 2015-05-03 DIAGNOSIS — Z79899 Other long term (current) drug therapy: Secondary | ICD-10-CM | POA: Insufficient documentation

## 2015-05-03 DIAGNOSIS — I10 Essential (primary) hypertension: Secondary | ICD-10-CM | POA: Insufficient documentation

## 2015-05-03 MED ORDER — NAPROXEN 500 MG PO TABS: 500.0000 mg | ORAL_TABLET | Freq: Two times a day (BID) | ORAL | Status: DC

## 2015-05-03 MED ORDER — TIZANIDINE HCL 4 MG PO TABS: 4.0000 mg | ORAL_TABLET | Freq: Three times a day (TID) | ORAL | Status: DC | PRN

## 2015-05-03 MED ORDER — LISINOPRIL-HYDROCHLOROTHIAZIDE 20-25 MG PO TABS: 1.0000 | ORAL_TABLET | Freq: Every day | ORAL | Status: DC

## 2015-05-03 NOTE — Progress Notes (Signed)
Subjective:  Patient ID: Lonnie Brown, male    DOB: 02/17/1960  Age: 55 y.o. MRN: [id number]  CC: Back Pain and Ankle Pain   HPI Lonnie PoundsMichael Laser presents for presents for follow-up of elevated blood pressure after working on lifestyle changes and blood pressure still remains mildly elevated.  He is currently on NSAIDS for bilateral achilles tendinitis and a muscle relaxant for his back. The back pain he complained of at his last visit has subsided and left achilles tendon has improved.  Outpatient Prescriptions Prior to Visit  Medication Sig Dispense Refill  . lisinopril-hydrochlorothiazide (PRINZIDE,ZESTORETIC) 10-12.5 MG tablet Take 1 tablet by mouth daily. 30 tablet 2  . naproxen (NAPROSYN) 500 MG tablet Take 1 tablet (500 mg total) by mouth 2 (two) times daily with a meal. 30 tablet 2  . tiZANidine (ZANAFLEX) 4 MG tablet Take 1 tablet (4 mg total) by mouth every 8 (eight) hours as needed for muscle spasms. 90 tablet 1   No facility-administered medications prior to visit.    ROS Review of Systems Constitutional: Negative for activity change and appetite change.  HENT: Negative for sinus pressure and sore throat.   Respiratory: Negative for chest tightness, shortness of breath and wheezing.   Cardiovascular: Negative for chest pain and palpitations.  Gastrointestinal: Negative for abdominal pain, constipation and abdominal distention.  Genitourinary: Negative.   Musculoskeletal:       See hpi  Psychiatric/Behavioral: Negative for behavioral problems and dysphoric mood.   Objective:  BP 142/91 mmHg  Pulse 76  Temp(Src) 97.9 F (36.6 C) (Oral)  Resp 16  Ht 5\' 11"  (1.803 m)  Wt 286 lb 6.4 oz (129.91 kg)  BMI 39.96 kg/m2  SpO2 99%  BP/Weight 05/03/2015 04/19/2015 04/09/2015  Systolic BP 142 160 150  Diastolic BP 91 95 90  Wt. (Lbs) 286.4 280 282  BMI 39.96 39.07 39.35      Physical Exam Constitutional: He is oriented to person, place, and time. He appears  well-developed and well-nourished.  Cardiovascular: Normal rate, normal heart sounds and intact distal pulses.   No murmur heard. Pulmonary/Chest: Effort normal and breath sounds normal. He has no wheezes. He has no rales. He exhibits no tenderness.  Abdominal: Soft. Bowel sounds are normal. He exhibits no distension and no mass. There is no tenderness.  Musculoskeletal: Normal range of motion. No tenderness on palpation of the back. He exhibits tenderness (mild tenderness on palpation of left achilles tendon and on dorsiflexion).  Neurological: He is alert and oriented to person, place, and time.   Assessment & Plan:   1. Muscle spasm of back No pain at this time - tiZANidine (ZANAFLEX) 4 MG tablet; Take 1 tablet (4 mg total) by mouth every 8 (eight) hours as needed for muscle spasms.  Dispense: 90 tablet; Refill: 1  2. Essential hypertension Blood pressure is slightly above target of less than 140/90 I will increase the dose of his antihypertensives due to the fact that he is on an NSAID. Low-sodium, DASH diet - lisinopril-hydrochlorothiazide (PRINZIDE,ZESTORETIC) 20-25 MG tablet; Take 1 tablet by mouth daily.  Dispense: 30 tablet; Refill: 3  3. Achilles tendinitis of both lower extremities Pain has improved somewhat on NSAIDs. - naproxen (NAPROSYN) 500 MG tablet; Take 1 tablet (500 mg total) by mouth 2 (two) times daily with a meal.  Dispense: 30 tablet; Refill: 2   Meds ordered this encounter  Medications  . tiZANidine (ZANAFLEX) 4 MG tablet    Sig: Take 1 tablet (4 mg total)  by mouth every 8 (eight) hours as needed for muscle spasms.    Dispense:  90 tablet    Refill:  1  . lisinopril-hydrochlorothiazide (PRINZIDE,ZESTORETIC) 20-25 MG tablet    Sig: Take 1 tablet by mouth daily.    Dispense:  30 tablet    Refill:  3    Discontinue previous dose  . naproxen (NAPROSYN) 500 MG tablet    Sig: Take 1 tablet (500 mg total) by mouth 2 (two) times daily with a meal.    Dispense:   30 tablet    Refill:  2    Follow-up: Return in about 1 month (around 06/02/2015) for Follow-up of hypertension.   Jaclyn Shaggy MD

## 2015-05-03 NOTE — Progress Notes (Signed)
Patient's here for f/up back and ankle pain.   Patient rates pain 5/10 and describe pain as a dull ache.

## 2015-05-25 ENCOUNTER — Telehealth: Payer: Self-pay | Admitting: Family Medicine

## 2015-05-25 NOTE — Telephone Encounter (Signed)
Patient is requesting a referral to the podiatry specialist  Patient has a bunion on his right foot   Please follow up with patient

## 2015-05-27 NOTE — Telephone Encounter (Signed)
Placed call to patient, patient did not answer. Message was left for patient to return my call.

## 2015-05-28 NOTE — Telephone Encounter (Signed)
Patient returned nurse phone call. °Please follow up. °

## 2015-06-01 NOTE — Telephone Encounter (Signed)
Pt. Came in requesting to be referred out the the podiatry specialist. Please f/u

## 2015-07-20 ENCOUNTER — Ambulatory Visit: Payer: Self-pay | Attending: Family Medicine | Admitting: Family Medicine

## 2015-07-20 ENCOUNTER — Encounter: Payer: Self-pay | Admitting: Family Medicine

## 2015-07-20 VITALS — BP 173/105 | HR 88 | Temp 98.5°F | Wt 291.0 lb

## 2015-07-20 DIAGNOSIS — I10 Essential (primary) hypertension: Secondary | ICD-10-CM

## 2015-07-20 DIAGNOSIS — Z1211 Encounter for screening for malignant neoplasm of colon: Secondary | ICD-10-CM

## 2015-07-20 DIAGNOSIS — E669 Obesity, unspecified: Secondary | ICD-10-CM

## 2015-07-20 MED ORDER — LISINOPRIL-HYDROCHLOROTHIAZIDE 20-25 MG PO TABS: 1.0000 | ORAL_TABLET | Freq: Every day | ORAL | Status: DC

## 2015-07-20 NOTE — Progress Notes (Signed)
   Subjective:  Patient ID: Lonnie Brown, male    DOB: Jun 13, 1960  Age: 55 y.o. MRN: [id number]  CC: Leg Swelling; Referral; and Requesting Hep testing   HPI Lonnie Brown is a 55 year old male with a history of hypertension, obesity who comes into the clinic for follow-up visit and to request referral for colonoscopy. Blood pressure is severely elevated and he has not been compliant with his antihypertensives neither has he been exercising or adhering to low sodium diet.  He has no other complaints today.  Outpatient Prescriptions Prior to Visit  Medication Sig Dispense Refill  . naproxen (NAPROSYN) 500 MG tablet Take 1 tablet (500 mg total) by mouth 2 (two) times daily with a meal. 30 tablet 2  . tiZANidine (ZANAFLEX) 4 MG tablet Take 1 tablet (4 mg total) by mouth every 8 (eight) hours as needed for muscle spasms. 90 tablet 1  . lisinopril-hydrochlorothiazide (PRINZIDE,ZESTORETIC) 20-25 MG tablet Take 1 tablet by mouth daily. 30 tablet 3   No facility-administered medications prior to visit.    ROS Review of Systems  Constitutional: Negative for activity change and appetite change.  HENT: Negative for sinus pressure and sore throat.   Respiratory: Negative for chest tightness, shortness of breath and wheezing.   Cardiovascular: Negative for chest pain and palpitations.  Gastrointestinal: Negative for abdominal pain, constipation and abdominal distention.  Genitourinary: Negative.   Musculoskeletal: Negative.   Psychiatric/Behavioral: Negative for behavioral problems and dysphoric mood.    Objective:  BP 173/105 mmHg  Pulse 88  Temp(Src) 98.5 F (36.9 C) (Oral)  Wt 291 lb (131.997 kg)  SpO2 94%  BP/Weight 07/20/2015 05/03/2015 04/19/2015  Systolic BP 173 142 160  Diastolic BP 105 91 95  Wt. (Lbs) 291 286.4 280  BMI 40.6 39.96 39.07      Physical Exam  Constitutional: He is oriented to person, place, and time. He appears well-developed and well-nourished.    obese  Cardiovascular: Normal rate, normal heart sounds and intact distal pulses.   No murmur heard. Pulmonary/Chest: Effort normal and breath sounds normal. He has no wheezes. He has no rales. He exhibits no tenderness.  Abdominal: Soft. Bowel sounds are normal. He exhibits no distension and no mass. There is no tenderness.  Musculoskeletal: Normal range of motion.  Neurological: He is alert and oriented to person, place, and time.     Assessment & Plan:   1. Encounter for screening colonoscopy - Ambulatory referral to Gastroenterology  2. Essential hypertension Uncontrolled due to noncompliance Advised to comply with antihypertensives Low-sodium, DASH diet. - lisinopril-hydrochlorothiazide (PRINZIDE,ZESTORETIC) 20-25 MG tablet; Take 1 tablet by mouth daily.  Dispense: 30 tablet; Refill: 3  3. Obesity Discussed reducing portion sizes, exercises.   Meds ordered this encounter  Medications  . lisinopril-hydrochlorothiazide (PRINZIDE,ZESTORETIC) 20-25 MG tablet    Sig: Take 1 tablet by mouth daily.    Dispense:  30 tablet    Refill:  3    Follow-up: Return in about 3 weeks (around 08/10/2015) for follow up of hypertension.   Jaclyn ShaggyEnobong Amao MD

## 2015-07-20 NOTE — Patient Instructions (Signed)
Hypertension Hypertension, commonly called high blood pressure, is when the force of blood pumping through your arteries is too strong. Your arteries are the blood vessels that carry blood from your heart throughout your body. A blood pressure reading consists of a higher number over a lower number, such as 110/72. The higher number (systolic) is the pressure inside your arteries when your heart pumps. The lower number (diastolic) is the pressure inside your arteries when your heart relaxes. Ideally you want your blood pressure below 120/80. Hypertension forces your heart to work harder to pump blood. Your arteries may become narrow or stiff. Having untreated or uncontrolled hypertension can cause heart attack, stroke, kidney disease, and other problems. RISK FACTORS Some risk factors for high blood pressure are controllable. Others are not.  Risk factors you cannot control include:   Race. You may be at higher risk if you are African American.  Age. Risk increases with age.  Gender. Men are at higher risk than women before age 45 years. After age 65, women are at higher risk than men. Risk factors you can control include:  Not getting enough exercise or physical activity.  Being overweight.  Getting too much fat, sugar, calories, or salt in your diet.  Drinking too much alcohol. SIGNS AND SYMPTOMS Hypertension does not usually cause signs or symptoms. Extremely high blood pressure (hypertensive crisis) may cause headache, anxiety, shortness of breath, and nosebleed. DIAGNOSIS To check if you have hypertension, your health care provider will measure your blood pressure while you are seated, with your arm held at the level of your heart. It should be measured at least twice using the same arm. Certain conditions can cause a difference in blood pressure between your right and left arms. A blood pressure reading that is higher than normal on one occasion does not mean that you need treatment. If  it is not clear whether you have high blood pressure, you may be asked to return on a different day to have your blood pressure checked again. Or, you may be asked to monitor your blood pressure at home for 1 or more weeks. TREATMENT Treating high blood pressure includes making lifestyle changes and possibly taking medicine. Living a healthy lifestyle can help lower high blood pressure. You may need to change some of your habits. Lifestyle changes may include:  Following the DASH diet. This diet is high in fruits, vegetables, and whole grains. It is low in salt, red meat, and added sugars.  Keep your sodium intake below 2,300 mg per day.  Getting at least 30-45 minutes of aerobic exercise at least 4 times per week.  Losing weight if necessary.  Not smoking.  Limiting alcoholic beverages.  Learning ways to reduce stress. Your health care provider may prescribe medicine if lifestyle changes are not enough to get your blood pressure under control, and if one of the following is true:  You are 18-59 years of age and your systolic blood pressure is above 140.  You are 60 years of age or older, and your systolic blood pressure is above 150.  Your diastolic blood pressure is above 90.  You have diabetes, and your systolic blood pressure is over 140 or your diastolic blood pressure is over 90.  You have kidney disease and your blood pressure is above 140/90.  You have heart disease and your blood pressure is above 140/90. Your personal target blood pressure may vary depending on your medical conditions, your age, and other factors. HOME CARE INSTRUCTIONS    Have your blood pressure rechecked as directed by your health care provider.   Take medicines only as directed by your health care provider. Follow the directions carefully. Blood pressure medicines must be taken as prescribed. The medicine does not work as well when you skip doses. Skipping doses also puts you at risk for  problems.  Do not smoke.   Monitor your blood pressure at home as directed by your health care provider. SEEK MEDICAL CARE IF:   You think you are having a reaction to medicines taken.  You have recurrent headaches or feel dizzy.  You have swelling in your ankles.  You have trouble with your vision. SEEK IMMEDIATE MEDICAL CARE IF:  You develop a severe headache or confusion.  You have unusual weakness, numbness, or feel faint.  You have severe chest or abdominal pain.  You vomit repeatedly.  You have trouble breathing. MAKE SURE YOU:   Understand these instructions.  Will watch your condition.  Will get help right away if you are not doing well or get worse.   This information is not intended to replace advice given to you by your health care provider. Make sure you discuss any questions you have with your health care provider.   Document Released: 12/26/2004 Document Revised: 05/12/2014 Document Reviewed: 10/18/2012 Elsevier Interactive Patient Education 2016 [employer]  

## 2016-03-13 ENCOUNTER — Encounter (HOSPITAL_COMMUNITY): Payer: Self-pay

## 2016-03-13 ENCOUNTER — Emergency Department (HOSPITAL_COMMUNITY)
Admission: EM | Admit: 2016-03-13 | Discharge: 2016-03-13 | Disposition: A | Discharge status: Home / Self Care | Payer: Self-pay | Attending: Emergency Medicine | Admitting: Emergency Medicine

## 2016-03-13 DIAGNOSIS — I10 Essential (primary) hypertension: Secondary | ICD-10-CM | POA: Insufficient documentation

## 2016-03-13 DIAGNOSIS — N289 Disorder of kidney and ureter, unspecified: Secondary | ICD-10-CM | POA: Insufficient documentation

## 2016-03-13 DIAGNOSIS — Z79899 Other long term (current) drug therapy: Secondary | ICD-10-CM | POA: Insufficient documentation

## 2016-03-13 DIAGNOSIS — R600 Localized edema: Secondary | ICD-10-CM | POA: Insufficient documentation

## 2016-03-13 DIAGNOSIS — R609 Edema, unspecified: Secondary | ICD-10-CM

## 2016-03-13 HISTORY — DX: Essential (primary) hypertension: I10

## 2016-03-13 LAB — CBC WITH DIFFERENTIAL/PLATELET
BASOS ABS: 0 10*3/uL (ref 0.0–0.1)
Basophils Relative: 0 %
Eosinophils Absolute: 0.2 10*3/uL (ref 0.0–0.7)
Eosinophils Relative: 2 %
HEMATOCRIT: 40.7 % (ref 39.0–52.0)
Hemoglobin: 13.7 g/dL (ref 13.0–17.0)
LYMPHS PCT: 39 %
Lymphs Abs: 2.7 10*3/uL (ref 0.7–4.0)
MCH: 29.3 pg (ref 26.0–34.0)
MCHC: 33.7 g/dL (ref 30.0–36.0)
MCV: 87.2 fL (ref 78.0–100.0)
Monocytes Absolute: 0.5 10*3/uL (ref 0.1–1.0)
Monocytes Relative: 7 %
NEUTROS ABS: 3.6 10*3/uL (ref 1.7–7.7)
NEUTROS PCT: 52 %
PLATELETS: 269 10*3/uL (ref 150–400)
RBC: 4.67 MIL/uL (ref 4.22–5.81)
RDW: 13.7 % (ref 11.5–15.5)
WBC: 7 10*3/uL (ref 4.0–10.5)

## 2016-03-13 LAB — BASIC METABOLIC PANEL
ANION GAP: 9 (ref 5–15)
BUN: 21 mg/dL — AB (ref 6–20)
CO2: 26 mmol/L (ref 22–32)
Calcium: 9.8 mg/dL (ref 8.9–10.3)
Chloride: 105 mmol/L (ref 101–111)
Creatinine, Ser: 1.71 mg/dL — ABNORMAL HIGH (ref 0.61–1.24)
GFR calc Af Amer: 49 mL/min — ABNORMAL LOW (ref >60)
GFR, EST NON AFRICAN AMERICAN: 43 mL/min — AB (ref >60)
GLUCOSE: 129 mg/dL — AB (ref 65–99)
POTASSIUM: 3.9 mmol/L (ref 3.5–5.1)
Sodium: 140 mmol/L (ref 135–145)

## 2016-03-13 MED ORDER — LISINOPRIL-HYDROCHLOROTHIAZIDE 20-25 MG PO TABS: 1.0000 | ORAL_TABLET | Freq: Every day | ORAL | 0 refills | Status: DC

## 2016-03-13 NOTE — ED Provider Notes (Signed)
[facility]-EMERGENCY DEPT Provider Note   CSN: [id number] Arrival date & time: 03/13/16  0205  By signing my name below, I, Lonnie Brown, attest that this documentation has been prepared under the direction and in the presence of Dione Booze, MD. Electronically Signed: Elder Brown, Scribe. 03/13/16. 2:24 AM.   History   Chief Complaint Chief Complaint  Patient presents with  . Leg Drainage    HPI Lonnie Brown is a 56 y.o. male with history of hypertension who presents to the ED for evaluation of "leg draining". This patient states that approximately 2 hours ago he was in bed when he first noticed a small amount of clear drainage from his L lower leg. At interview, he denies any pain complaints or associated itching. He denies any particular trauma to the site. He is ambulatory. Of note, the patient does state that he has not taken any anti-hypertensive medications since last July when his last prescription was filled.   The history is provided by the patient. No language interpreter was used.    Past Medical History:  Diagnosis Date  . Hypertension     Patient Active Problem List   Diagnosis Date Noted  . Obesity 07/20/2015  . Achilles tendinitis 04/09/2015  . Hypertension 03/04/2015  . Muscle spasm of back 03/04/2015    Past Surgical History:  Procedure Laterality Date  . EYE SURGERY    . TONSILLECTOMY         Home Medications    Prior to Admission medications   Medication Sig Start Date End Date Taking? Authorizing Provider  lisinopril-hydrochlorothiazide (PRINZIDE,ZESTORETIC) 20-25 MG tablet Take 1 tablet by mouth daily. 07/20/15   Jaclyn Shaggy, MD  naproxen (NAPROSYN) 500 MG tablet Take 1 tablet (500 mg total) by mouth 2 (two) times daily with a meal. 05/03/15   Jaclyn Shaggy, MD  tiZANidine (ZANAFLEX) 4 MG tablet Take 1 tablet (4 mg total) by mouth every 8 (eight) hours as needed for muscle spasms. 05/03/15   Jaclyn Shaggy, MD    Family History Family  History  Problem Relation Age of Onset  . Diabetes Mother   . Hypertension Mother     Social History Social History  Substance Use Topics  . Smoking status: Never Smoker  . Smokeless tobacco: Never Used  . Alcohol use No     Allergies   Banana   Review of Systems Review of Systems  Cardiovascular:       "clear L leg drainage"  All other systems reviewed and are negative.    Physical Exam Updated Vital Signs BP (!) 190/111 (BP Location: Right Arm)   Pulse 86   Temp 97.7 F (36.5 C) (Oral)   Resp 16   SpO2 97%   Physical Exam  Constitutional: He is oriented to person, place, and time. He appears well-developed and well-nourished.  HENT:  Head: Normocephalic and atraumatic.  Eyes: EOM are normal. Pupils are equal, round, and reactive to light.  Neck: Normal range of motion. Neck supple. No JVD present.  Cardiovascular: Normal rate, regular rhythm and normal heart sounds.   No murmur heard. Pulmonary/Chest: Effort normal and breath sounds normal. He has no wheezes. He has no rales. He exhibits no tenderness.  Abdominal: Soft. Bowel sounds are normal. He exhibits no distension and no mass. There is no tenderness.  Musculoskeletal: Normal range of motion.  1+ pretibial edema bilaterally with clear drainage of edema fluid from L lower leg.  Lymphadenopathy:    He has no cervical adenopathy.  Neurological: He is alert and oriented to person, place, and time. No cranial nerve deficit. He exhibits normal muscle tone. Coordination normal.  Skin: Skin is warm and dry. No rash noted.  Psychiatric: He has a normal mood and affect. His behavior is normal. Judgment and thought content normal.  Nursing note and vitals reviewed.    ED Treatments / Results  DIAGNOSTIC STUDIES: Oxygen Saturation is 97 percent on room air which is normal by my interpretation.    COORDINATION OF CARE: 2:22 AM Discussed treatment plan with pt at bedside and pt agreed to plan.  Labs (all labs  ordered are listed, but only abnormal results are displayed) Labs Reviewed  BASIC METABOLIC PANEL - Abnormal; Notable for the following:       Result Value   Glucose, Bld 129 (*)    BUN 21 (*)    Creatinine, Ser 1.71 (*)    GFR calc non Af Amer 43 (*)    GFR calc Af Amer 49 (*)    All other components within normal limits  CBC WITH DIFFERENTIAL/PLATELET    Procedures Procedures (including critical care time)  Medications Ordered in ED Medications - No data to display   Initial Impression / Assessment and Plan / ED Course  I have reviewed the triage vital signs and the nursing notes.  Pertinent labs & imaging results that were available during my care of the patient were reviewed by me and considered in my medical decision making (see chart for details).  Many drainage which appears to be edema fluid. Poorly controlled hypertension secondary to an indication noncompliance. Old records reviewed, and the has had problems with hypertension and medication noncompliance in the past. Screening labs are obtained showing worsening of creatinine over the last year. Creatinine is 1.71 today, and was 1.45 in April 2017. Patient is advised of the worsening kidney function and the concern over potential need for dialysis. He is given a prescription for his blood pressure medication (lisinopril-hydrochlorothiazide). Importance of good control blood pressure was stressed. Follow-up with his PCP in the next week.  Final Clinical Impressions(s) / ED Diagnoses   Final diagnoses:  Peripheral edema  Essential hypertension  Renal insufficiency    New Prescriptions New Prescriptions   No medications on file   I personally performed the services described in this documentation, which was scribed in my presence. The recorded information has been reviewed and is accurate.      Dione Boozeavid Nannette Zill, MD 03/13/16 [phone]

## 2016-03-13 NOTE — ED Triage Notes (Signed)
Pt complaining of drainage to L leg. Pt denies any wounds or sores. Pt denies any chest pain or SOB. Pt denies any injury/trauma. Pt with clear drainage to L lower leg. No warmth or redness noted. Pt ambulatory.

## 2016-03-13 NOTE — Discharge Instructions (Signed)
Keep the area that is draining covered. Return to the ED if you start running a fever, or if the skin around where it is draining starts getting red.  Monitor your blood pressure at home. Keep a record of the readings, and take them with you when you see your primary care provider.  Do not stop taking your blood pressure medication. If you do not control your blood pressure, you are at increased risk for a heart attack, stroke, or kidney failure. Your kidneys are already starting to show the effect of high blood pressure - if it continues, you could end up on dialysis.

## 2016-03-28 ENCOUNTER — Telehealth: Payer: Self-pay | Admitting: Family Medicine

## 2016-03-28 ENCOUNTER — Encounter (INDEPENDENT_AMBULATORY_CARE_PROVIDER_SITE_OTHER): Payer: Self-pay

## 2016-03-28 DIAGNOSIS — I10 Essential (primary) hypertension: Secondary | ICD-10-CM

## 2016-03-28 NOTE — Telephone Encounter (Signed)
Pt presents requesting a refill on his medication for htn.

## 2016-03-29 MED ORDER — LISINOPRIL-HYDROCHLOROTHIAZIDE 20-25 MG PO TABS: 1.0000 | ORAL_TABLET | Freq: Every day | ORAL | 0 refills | Status: DC

## 2016-03-29 NOTE — Telephone Encounter (Signed)
Done

## 2016-03-30 MED FILL — LISINOPRIL-HCTZ 20-25 MG TA: 20-25 | 30 days supply | Qty: 30 | Fill #0

## 2016-05-01 MED FILL — LISINOPRIL-HCTZ 20-25 MG TA: 20-25 | 30 days supply | Qty: 30 | Fill #0

## 2016-06-06 MED FILL — LISINOPRIL-HCTZ 20-25 MG TA: 20-25 | 30 days supply | Qty: 30 | Fill #1

## 2016-06-14 ENCOUNTER — Emergency Department (HOSPITAL_COMMUNITY)
Admission: EM | Admit: 2016-06-14 | Discharge: 2016-06-14 | Disposition: A | Discharge status: Home / Self Care | Payer: Self-pay | Attending: Emergency Medicine | Admitting: Emergency Medicine

## 2016-06-14 ENCOUNTER — Encounter (HOSPITAL_COMMUNITY): Payer: Self-pay

## 2016-06-14 DIAGNOSIS — I10 Essential (primary) hypertension: Secondary | ICD-10-CM | POA: Insufficient documentation

## 2016-06-14 DIAGNOSIS — Y998 Other external cause status: Secondary | ICD-10-CM | POA: Insufficient documentation

## 2016-06-14 DIAGNOSIS — Y9389 Activity, other specified: Secondary | ICD-10-CM | POA: Insufficient documentation

## 2016-06-14 DIAGNOSIS — Z23 Encounter for immunization: Secondary | ICD-10-CM | POA: Insufficient documentation

## 2016-06-14 DIAGNOSIS — Z79899 Other long term (current) drug therapy: Secondary | ICD-10-CM | POA: Insufficient documentation

## 2016-06-14 DIAGNOSIS — W5501XA Bitten by cat, initial encounter: Secondary | ICD-10-CM | POA: Insufficient documentation

## 2016-06-14 DIAGNOSIS — Y92009 Unspecified place in unspecified non-institutional (private) residence as the place of occurrence of the external cause: Secondary | ICD-10-CM | POA: Insufficient documentation

## 2016-06-14 DIAGNOSIS — S61551A Open bite of right wrist, initial encounter: Secondary | ICD-10-CM | POA: Insufficient documentation

## 2016-06-14 MED ORDER — AMOXICILLIN-POT CLAVULANATE 875-125 MG PO TABS: 1.0000 | ORAL_TABLET | Freq: Two times a day (BID) | ORAL | 0 refills | Status: DC

## 2016-06-14 MED ORDER — TETANUS-DIPHTH-ACELL PERTUSSIS 5-2.5-18.5 LF-MCG/0.5 IM SUSP
0.5000 mL | Freq: Once | INTRAMUSCULAR | Status: AC
  Administered 2016-06-14: 0.5 mL via INTRAMUSCULAR
  Filled 2016-06-14: qty 0.5

## 2016-06-14 MED ORDER — RABIES IMMUNE GLOBULIN 150 UNIT/ML IM INJ
20.0000 [IU]/kg | INJECTION | Freq: Once | INTRAMUSCULAR | Status: AC
  Administered 2016-06-14: 2625 [IU] via INTRAMUSCULAR
  Filled 2016-06-14: qty 17.5

## 2016-06-14 MED ORDER — AMOXICILLIN-POT CLAVULANATE 875-125 MG PO TABS
1.0000 | ORAL_TABLET | Freq: Once | ORAL | Status: AC
  Administered 2016-06-14: 1 via ORAL
  Filled 2016-06-14: qty 1

## 2016-06-14 MED ORDER — RABIES VACCINE, PCEC IM SUSR
1.0000 mL | Freq: Once | INTRAMUSCULAR | Status: AC
Start: 2016-06-14 — End: 2016-06-14
  Administered 2016-06-14: 1 mL via INTRAMUSCULAR
  Filled 2016-06-14: qty 1

## 2016-06-14 MED FILL — AMOX-CLAV 875-125 MG TABLET: 875-125 | 10 days supply | Qty: 20 | Fill #0

## 2016-06-14 NOTE — ED Triage Notes (Signed)
Pt here for cat bite to right wrist this AM by feral cat. Unknown vaccinations

## 2016-06-14 NOTE — ED Provider Notes (Signed)
[facility]-EMERGENCY DEPT Provider Note   CSN: [id number] Arrival date & time: 06/14/16  0865     History   Chief Complaint Chief Complaint  Patient presents with  . Animal Bite    HPI Lonnie Brown is a 56 y.o. male.  Patient presents to the emergency department for evaluation of Eye. Patient reports that he felt That his wife has been feeding got into his house tonight. When he was attempting to get Out of the house to Him on the right wrist. Patient reports very slight pain in the area of the wrist. No swelling, drainage. The cat did escape from the house and is not retrievable.      Past Medical History:  Diagnosis Date  . Hypertension     Patient Active Problem List   Diagnosis Date Noted  . Obesity 07/20/2015  . Achilles tendinitis 04/09/2015  . Hypertension 03/04/2015  . Muscle spasm of back 03/04/2015    Past Surgical History:  Procedure Laterality Date  . EYE SURGERY    . TONSILLECTOMY         Home Medications    Prior to Admission medications   Medication Sig Start Date End Date Taking? Authorizing Provider  lisinopril-hydrochlorothiazide (PRINZIDE,ZESTORETIC) 20-25 MG tablet Take 1 tablet by mouth daily. 03/29/16   Jaclyn Shaggy, MD  naproxen (NAPROSYN) 500 MG tablet Take 1 tablet (500 mg total) by mouth 2 (two) times daily with a meal. 05/03/15   Jaclyn Shaggy, MD  tiZANidine (ZANAFLEX) 4 MG tablet Take 1 tablet (4 mg total) by mouth every 8 (eight) hours as needed for muscle spasms. 05/03/15   Jaclyn Shaggy, MD    Family History Family History  Problem Relation Age of Onset  . Diabetes Mother   . Hypertension Mother     Social History Social History  Substance Use Topics  . Smoking status: Never Smoker  . Smokeless tobacco: Never Used  . Alcohol use No     Allergies   Banana   Review of Systems Review of Systems  Skin: Positive for wound.  All other systems reviewed and are negative.    Physical Exam Updated Vital Signs Wt 132  kg (291 lb)   BMI 40.59 kg/m   Physical Exam  Constitutional: He is oriented to person, place, and time. He appears well-developed and well-nourished. No distress.  HENT:  Head: Normocephalic and atraumatic.  Right Ear: Hearing normal.  Left Ear: Hearing normal.  Nose: Nose normal.  Mouth/Throat: Oropharynx is clear and moist and mucous membranes are normal.  Eyes: Conjunctivae and EOM are normal. Pupils are equal, round, and reactive to light.  Neck: Normal range of motion. Neck supple.  Cardiovascular: Regular rhythm, S1 normal and S2 normal.  Exam reveals no gallop and no friction rub.   No murmur heard. Pulmonary/Chest: Effort normal and breath sounds normal. No respiratory distress. He exhibits no tenderness.  Abdominal: Soft. Normal appearance and bowel sounds are normal. There is no hepatosplenomegaly. There is no tenderness. There is no rebound, no guarding, no tenderness at McBurney's point and negative Murphy's sign. No hernia.  Musculoskeletal: Normal range of motion.  Neurological: He is alert and oriented to person, place, and time. He has normal strength. No cranial nerve deficit or sensory deficit. Coordination normal. GCS eye subscore is 4. GCS verbal subscore is 5. GCS motor subscore is 6.  Skin: Skin is warm, dry and intact. No rash noted. No cyanosis.  6 discrete punctures on volar aspect of wrist without swelling or  redness  Psychiatric: He has a normal mood and affect. His speech is normal and behavior is normal. Thought content normal.  Nursing note and vitals reviewed.    ED Treatments / Results  Labs (all labs ordered are listed, but only abnormal results are displayed) Labs Reviewed - No data to display  EKG  EKG Interpretation None       Radiology No results found.  Procedures Procedures (including critical care time)  Medications Ordered in ED Medications  amoxicillin-clavulanate (AUGMENTIN) 875-125 MG per tablet 1 tablet (not administered)    rabies immune globulin (HYPERAB) injection 2,625 Units (not administered)  rabies vaccine (RABAVERT) injection 1 mL (not administered)  Tdap (BOOSTRIX) injection 0.5 mL (not administered)     Initial Impression / Assessment and Plan / ED Course  I have reviewed the triage vital signs and the nursing notes.  Pertinent labs & imaging results that were available during my care of the patient were reviewed by me and considered in my medical decision making (see chart for details).     Patient bitten by a femoral Tonight. Cat has run off and cannot be quarantined. Patient will therefore be initiated on rabies vaccination series and rabies antibody. He was started on empiric Augmentin.  Final Clinical Impressions(s) / ED Diagnoses   Final diagnoses:  Cat bite, initial encounter    New Prescriptions New Prescriptions   No medications on file     Gilda CreasePollina, Christopher J, MD 06/14/16 [phone]

## 2016-06-14 NOTE — ED Notes (Signed)
Attempted to contact animal control. Automated msg states to call back after 8 am during normal business hours.

## 2016-06-14 NOTE — Discharge Instructions (Addendum)
You need to have additional Rabies Vaccine injections on June 9, June 13, and June 20.

## 2016-06-17 ENCOUNTER — Encounter (HOSPITAL_COMMUNITY): Payer: Self-pay | Admitting: Emergency Medicine

## 2016-06-17 ENCOUNTER — Emergency Department (HOSPITAL_COMMUNITY): Payer: Self-pay

## 2016-06-17 ENCOUNTER — Emergency Department (HOSPITAL_COMMUNITY)
Admission: EM | Admit: 2016-06-17 | Discharge: 2016-06-17 | Disposition: A | Discharge status: Home / Self Care | Payer: Self-pay | Attending: Emergency Medicine | Admitting: Emergency Medicine

## 2016-06-17 DIAGNOSIS — Z2914 Encounter for prophylactic rabies immune globin: Secondary | ICD-10-CM | POA: Insufficient documentation

## 2016-06-17 DIAGNOSIS — I10 Essential (primary) hypertension: Secondary | ICD-10-CM | POA: Insufficient documentation

## 2016-06-17 DIAGNOSIS — T783XXA Angioneurotic edema, initial encounter: Secondary | ICD-10-CM | POA: Insufficient documentation

## 2016-06-17 DIAGNOSIS — R220 Localized swelling, mass and lump, head: Secondary | ICD-10-CM

## 2016-06-17 DIAGNOSIS — Z203 Contact with and (suspected) exposure to rabies: Secondary | ICD-10-CM

## 2016-06-17 LAB — CBC
HCT: 40.5 % (ref 39.0–52.0)
Hemoglobin: 13.9 g/dL (ref 13.0–17.0)
MCH: 29.4 pg (ref 26.0–34.0)
MCHC: 34.3 g/dL (ref 30.0–36.0)
MCV: 85.8 fL (ref 78.0–100.0)
PLATELETS: 263 10*3/uL (ref 150–400)
RBC: 4.72 MIL/uL (ref 4.22–5.81)
RDW: 13 % (ref 11.5–15.5)
WBC: 8.1 10*3/uL (ref 4.0–10.5)

## 2016-06-17 LAB — COMPREHENSIVE METABOLIC PANEL
ALBUMIN: 3.7 g/dL (ref 3.5–5.0)
ALK PHOS: 57 U/L (ref 38–126)
ALT: 25 U/L (ref 17–63)
AST: 19 U/L (ref 15–41)
Anion gap: 9 (ref 5–15)
BUN: 28 mg/dL — AB (ref 6–20)
CALCIUM: 9.3 mg/dL (ref 8.9–10.3)
CO2: 26 mmol/L (ref 22–32)
Chloride: 98 mmol/L — ABNORMAL LOW (ref 101–111)
Creatinine, Ser: 1.77 mg/dL — ABNORMAL HIGH (ref 0.61–1.24)
GFR calc Af Amer: 47 mL/min — ABNORMAL LOW (ref >60)
GFR, EST NON AFRICAN AMERICAN: 41 mL/min — AB (ref >60)
GLUCOSE: 128 mg/dL — AB (ref 65–99)
Potassium: 4.1 mmol/L (ref 3.5–5.1)
Sodium: 133 mmol/L — ABNORMAL LOW (ref 135–145)
TOTAL PROTEIN: 7.8 g/dL (ref 6.5–8.1)
Total Bilirubin: 1.1 mg/dL (ref 0.3–1.2)

## 2016-06-17 LAB — CBG MONITORING, ED: Glucose-Capillary: 127 mg/dL — ABNORMAL HIGH (ref 65–99)

## 2016-06-17 MED ORDER — DIPHENHYDRAMINE HCL 50 MG/ML IJ SOLN
12.5000 mg | Freq: Once | INTRAMUSCULAR | Status: AC
  Administered 2016-06-17: 12.5 mg via INTRAVENOUS
  Filled 2016-06-17: qty 1

## 2016-06-17 MED ORDER — FAMOTIDINE IN NACL 20-0.9 MG/50ML-% IV SOLN
20.0000 mg | Freq: Once | INTRAVENOUS | Status: AC
  Administered 2016-06-17: 20 mg via INTRAVENOUS
  Filled 2016-06-17: qty 50

## 2016-06-17 MED ORDER — PREDNISONE 50 MG PO TABS: 50.0000 mg | ORAL_TABLET | Freq: Every day | ORAL | 0 refills | Status: AC

## 2016-06-17 MED ORDER — IOPAMIDOL (ISOVUE-300) INJECTION 61%
INTRAVENOUS | Status: AC
  Administered 2016-06-17: 75 mL
  Filled 2016-06-17: qty 75

## 2016-06-17 MED ORDER — METHYLPREDNISOLONE SODIUM SUCC 125 MG IJ SOLR
125.0000 mg | Freq: Once | INTRAMUSCULAR | Status: AC
  Administered 2016-06-17: 125 mg via INTRAVENOUS
  Filled 2016-06-17: qty 2

## 2016-06-17 MED ORDER — RABIES VACCINE, PCEC IM SUSR
1.0000 mL | Freq: Once | INTRAMUSCULAR | Status: AC
  Administered 2016-06-17: 1 mL via INTRAMUSCULAR
  Filled 2016-06-17: qty 1

## 2016-06-17 NOTE — ED Provider Notes (Signed)
[facility]-EMER[facility]Y DEPT Provider Note   CSN: [id number] Arrival date & time: 06/17/16  4098     History   Chief Complaint Chief Complaint  Patient presents with  . Animal Bite  . Altered Mental Status    HPI Lonnie Brown is a 56 y.o. male.  56 yo M w/ some difficulty swallowing and left neck swollen feeling since waking this AM. Handling secretions appropriately, no dyspnea. No h/o same. Allergic to bananas but not had those recently. Did have rabies vaccine and started on augmentin Wednesday but no problems with those.  No fever, vomiting, abdominal pain, rash. Bite wounds improving.     Animal Bite  Contact animal:  Cat Location:  Shoulder/arm Shoulder/arm injury location:  R arm Time since incident:  3 days Pain details:    Quality:  Aching and stinging   Severity:  Mild   Timing:  Constant   Progression:  Improving   Past Medical History:  Diagnosis Date  . Hypertension     Patient Active Problem List   Diagnosis Date Noted  . Obesity 07/20/2015  . Achilles tendinitis 04/09/2015  . Hypertension 03/04/2015  . Muscle spasm of back 03/04/2015    Past Surgical History:  Procedure Laterality Date  . EYE SURGERY    . TONSILLECTOMY         Home Medications    Prior to Admission medications   Medication Sig Start Date End Date Taking? Authorizing Provider  amoxicillin-clavulanate (AUGMENTIN) 875-125 MG tablet Take 1 tablet by mouth 2 (two) times daily. 06/14/16  Yes Pollina, Canary Brim, MD  predniSONE (DELTASONE) 50 MG tablet Take 1 tablet (50 mg total) by mouth daily. 06/17/16 06/22/16  Tegeler, Canary Brim, MD    Family History Family History  Problem Relation Age of Onset  . Diabetes Mother   . Hypertension Mother     Social History Social History  Substance Use Topics  . Smoking status: Never Smoker  . Smokeless tobacco: Never Used  . Alcohol use No     Allergies   Banana   Review of Systems Review of Systems  All other systems  reviewed and are negative.    Physical Exam Updated Vital Signs BP 115/70   Pulse 86   Temp 97.7 F (36.5 C) (Oral)   Resp (!) 27   Ht 5\' 11"  (1.803 m)   Wt 129.3 kg (285 lb)   SpO2 91%   BMI 39.75 kg/m   Physical Exam  Constitutional: He is oriented to person, place, and time. He appears well-developed and well-nourished.  HENT:  Head: Normocephalic and atraumatic.  Eyes: Conjunctivae and EOM are normal. Pupils are equal, round, and reactive to light.  Neck: Normal range of motion.  Cardiovascular: Normal rate.   Pulmonary/Chest: Effort normal. No respiratory distress.  Abdominal: Soft. He exhibits no distension.  Musculoskeletal: Normal range of motion. He exhibits no edema or deformity.  Neurological: He is alert and oriented to person, place, and time. No cranial nerve deficit. Coordination normal.  No altered mental status, able to give full seemingly accurate history.  Face is symmetric, EOM's intact, pupils equal and reactive, vision intact, tongue and uvula midline without deviation Upper and Lower extremity motor 5/5, intact pain perception in distal extremities, 2+ reflexes in biceps, patella and achilles tendons. Finger to nose normal, heel to shin normal. Walks without assistance or evident ataxia.    Skin: Skin is warm and dry. There is erythema (around wounds on right forearm, not warm, tender or  edematous. wounds without drainage.).  Nursing note and vitals reviewed.    ED Treatments / Results  Labs (all labs ordered are listed, but only abnormal results are displayed) Labs Reviewed  COMPREHENSIVE METABOLIC PANEL - Abnormal; Notable for the following:       Result Value   Sodium 133 (*)    Chloride 98 (*)    Glucose, Bld 128 (*)    BUN 28 (*)    Creatinine, Ser 1.77 (*)    GFR calc non Af Amer 41 (*)    GFR calc Af Amer 47 (*)    All other components within normal limits  CBG MONITORING, ED - Abnormal; Notable for the following:    Glucose-Capillary  127 (*)    All other components within normal limits  CBC    EKG  EKG Interpretation None       Radiology Ct Soft Tissue Neck W Contrast  Result Date: 06/17/2016 CLINICAL DATA:  56 year old male status post wrist bite injury from CT 3 days ago. Awoke with left side throat and face swelling today. EXAM: CT NECK WITH CONTRAST TECHNIQUE: Multidetector CT imaging of the neck was performed using the standard protocol following the bolus administration of intravenous contrast. CONTRAST:  75mL ISOVUE-300 IOPAMIDOL (ISOVUE-300) INJECTION 61% COMPARISON:  Chest radiographs 04/19/2015 FINDINGS: Pharynx and larynx: Most of the larynx appears normal. There is mild pharyngeal mucosal space edema in the hypopharynx affecting also the epiglottis and AE folds (series 3, image 61), but the epiglottic thickening is only mild. The remaining pharyngeal soft tissue contours appear normal. Negative parapharyngeal and retropharyngeal spaces. Salivary glands: Negative sublingual space, submandibular glands, and parotid glands. Thyroid: Negative. Lymph nodes: Negative.  No cervical lymphadenopathy. Vascular: Major vascular structures in the neck and at the skullbase, including both internal jugular veins, are patent. The left vertebral artery is dominant. Calcified atherosclerosis at the skull base. Limited intracranial: Negative. Visualized orbits: Negative. Mastoids and visualized paranasal sinuses: Clear. Bilateral EAC cerumen. Skeleton: Widespread dental periapical lucency anteriorly and on the left. Lower lumbar spine disc and endplate degeneration. No acute osseous abnormality identified. Upper chest: Normal visualized trachea. Negative lung apices. Normal visualized superior mediastinum. No subclavian region lymphadenopathy. Other: No superficial soft tissue inflammation identified. IMPRESSION: 1. Symmetric appearing mild pharyngeal mucosal space edema at the hypopharynx and involving the supraglottic larynx. This is  nonspecific but might reflect angioedema. No associated epiglottitis or airway compromise. 2. Otherwise normal neck soft tissues. No lymphadenopathy, abscess, or cellulitis identified. 3. Dental inflammatory changes greater on the left. Electronically Signed   By: Odessa FlemingH  Hall M.D.   On: 06/17/2016 11:19    Procedures Procedures (including critical care time)  Medications Ordered in ED Medications  methylPREDNISolone sodium succinate (SOLU-MEDROL) 125 mg/2 mL injection 125 mg (125 mg Intravenous Given 06/17/16 0950)  famotidine (PEPCID) IVPB 20 mg premix (0 mg Intravenous Stopped 06/17/16 1105)  diphenhydrAMINE (BENADRYL) injection 12.5 mg (12.5 mg Intravenous Given 06/17/16 0951)  iopamidol (ISOVUE-300) 61 % injection (75 mLs  Contrast Given 06/17/16 1035)  rabies vaccine (RABAVERT) injection 1 mL (1 mL Intramuscular Given 06/17/16 1505)     Initial Impression / Assessment and Plan / ED Course  I have reviewed the triage vital signs and the nursing notes.  Pertinent labs & imaging results that were available during my care of the patient were reviewed by me and considered in my medical decision making (see chart for details).     Allergic reaction? Tooth? Mild angioedema? Will ct to ensure  no obvious blockages. otherwise will give second set of rabies booster and d/c lisinopril.   patietn with some lip swelling while in ER, no change in voice. Lungs still clear. VS WNL. No distress, tolerating PO fluids/solids. Plan for reevaluation and likely discharge if not worsening. Still plan for dc lisinopril. 2nd rabies shot given but lip swelling worsened a few minutes afterwards, unsure if related.   Final Clinical Impressions(s) / ED Diagnoses   Final diagnoses:  Angioedema, initial encounter  Need for post exposure prophylaxis for rabies  Lip swelling    New Prescriptions Discharge Medication List as of 06/17/2016 10:15 PM    START taking these medications   Details  predniSONE (DELTASONE) 50 MG  tablet Take 1 tablet (50 mg total) by mouth daily., Starting Sat 06/17/2016, Until Thu 06/22/2016, Print         Plummer Matich, Barbara Cower, MD 06/18/16 1610

## 2016-06-17 NOTE — ED Triage Notes (Signed)
Pt reports that he was bit by a cat on Wednesday and is here for his rabies vaccine. Pt also reports swelling to face and feeling like his throat is closing up onset this morning. Per family pt has had disorientation since waking up this morning. No facial droop, right arm grip stronger than left. No arm drift, speech slower. Per family notice a difference in the patients gait.

## 2016-06-17 NOTE — ED Notes (Signed)
Upon going to discharge patient, pt reports feeling as if his bottom lip is swollen. Lip does appear to be swollen. MD made aware.

## 2016-06-17 NOTE — Discharge Instructions (Addendum)
Please stop taking your lisinopril. Please follow-up with your PCP. Please take the steroids for the next several days. Please use Benadryl for the next several days. If any symptoms change or worsen or you have difficulty breathing, please return to the nearest emergency department.

## 2016-06-18 NOTE — ED Provider Notes (Signed)
Care assumed from Dr. Clayborne DanaMesner. At time of transfer of care, patient is awaiting reassessment of his lip swelling to watch for worsening area of suspected angioedema from lisinopril use and Rabies shots.  After several hours, patient was reassessed and had no voice changes, worsened swelling, or wheezing. Patient reports feeling unchanged and felt ready to go.  Patient reexamined and had no worsening findings. Patient will be given prescription for steroids and instructed to take Benadryl. Patient will follow-up with PCP in several days. Patient will stop his lisinopril use. Strict return precautions were given for any new or worsened symptoms. Patient and family had no other questions or concerns and patient was discharged in good condition as previously outlined.    Clinical Impression: 1. Angioedema, initial encounter   2. Need for post exposure prophylaxis for rabies   3. Lip swelling     Disposition: Discharge  Condition: Good  I have discussed the results, Dx and Tx plan with the pt(& family if present). He/she/they expressed understanding and agree(s) with the plan. Discharge instructions discussed at great length. Strict return precautions discussed and pt &/or family have verbalized understanding of the instructions. No further questions at time of discharge.    Discharge Medication List as of 06/17/2016 10:15 PM    START taking these medications   Details  predniSONE (DELTASONE) 50 MG tablet Take 1 tablet (50 mg total) by mouth daily., Starting Sat 06/17/2016, Until Thu 06/22/2016, Print        [facility] [address] [id number] [place] [id number] [phone] In 4 days   [facility] [address] [id number] [place] [id number] [phone]  If symptoms worsen  System, Pcp Not In   call about changing BP medications \     Damaree Sargent, Canary Brimhristopher J, MD 06/18/16 [phone]

## 2016-06-21 ENCOUNTER — Emergency Department (HOSPITAL_COMMUNITY)
Admission: EM | Admit: 2016-06-21 | Discharge: 2016-06-21 | Disposition: A | Discharge status: Home / Self Care | Payer: Self-pay | Attending: Emergency Medicine | Admitting: Emergency Medicine

## 2016-06-21 ENCOUNTER — Encounter (HOSPITAL_COMMUNITY): Payer: Self-pay | Admitting: Emergency Medicine

## 2016-06-21 DIAGNOSIS — I10 Essential (primary) hypertension: Secondary | ICD-10-CM | POA: Insufficient documentation

## 2016-06-21 DIAGNOSIS — Z23 Encounter for immunization: Secondary | ICD-10-CM | POA: Insufficient documentation

## 2016-06-21 MED ORDER — RABIES VACCINE, PCEC IM SUSR
1.0000 mL | Freq: Once | INTRAMUSCULAR | Status: AC
Start: 2016-06-21 — End: 2016-06-21
  Administered 2016-06-21: 1 mL via INTRAMUSCULAR
  Filled 2016-06-21: qty 1

## 2016-06-21 NOTE — ED Triage Notes (Signed)
Pt in need of 2nd round of Rabies Vaccinations. Cat bite 06/14/16. Pt reports compliance with antbx with no s/s of infection

## 2016-06-21 NOTE — ED Provider Notes (Signed)
[facility]-EMERGENCY DEPT Provider Note   CSN: [id number] Arrival date & time: 06/21/16  1937  By signing my name below, I, Rosana Fretana Waskiewicz, attest that this documentation has been prepared under the direction and in the presence of non-physician practitioner, Braden Deloach, Gordy CouncilmanAlexandra, PA-C. Electronically Signed: Rosana Fretana Waskiewicz, ED Scribe. 06/21/16. 8:11 PM.  History   Chief Complaint Chief Complaint  Patient presents with  . Rabies Vaccinations   The history is provided by the patient. No language interpreter was used.   HPI Comments: Lonnie PoundsMichael Brown is a 56 y.o. male who presents to the Emergency Department requesting his second rabies vaccination after he was bit by a cat 7 days ago. Pt reports associated bite marks on his right wrist from the incident. They have been healing well and he denies any pain, drainage, swelling, redness. Pt states he has been compliant with his Augmentin. Pt denies pain at this time, fever or any other complaints at this time.   Past Medical History:  Diagnosis Date  . Hypertension     Patient Active Problem List   Diagnosis Date Noted  . Obesity 07/20/2015  . Achilles tendinitis 04/09/2015  . Hypertension 03/04/2015  . Muscle spasm of back 03/04/2015    Past Surgical History:  Procedure Laterality Date  . EYE SURGERY    . TONSILLECTOMY         Home Medications    Prior to Admission medications   Medication Sig Start Date End Date Taking? Authorizing Provider  amoxicillin-clavulanate (AUGMENTIN) 875-125 MG tablet Take 1 tablet by mouth 2 (two) times daily. 06/14/16   Gilda CreasePollina, Christopher J, MD  predniSONE (DELTASONE) 50 MG tablet Take 1 tablet (50 mg total) by mouth daily. 06/17/16 06/22/16  Tegeler, Canary Brimhristopher J, MD    Family History Family History  Problem Relation Age of Onset  . Diabetes Mother   . Hypertension Mother     Social History Social History  Substance Use Topics  . Smoking status: Never Smoker  . Smokeless tobacco: Never Used    . Alcohol use No     Allergies   Banana   Review of Systems Review of Systems  Constitutional: Negative for fever.  Skin: Positive for wound.     Physical Exam Updated Vital Signs BP 119/66 (BP Location: Left Arm)   Pulse 81   Temp 98.1 F (36.7 C) (Oral)   Resp 18   SpO2 99%   Physical Exam  Constitutional: He appears well-developed and well-nourished. No distress.  HENT:  Head: Normocephalic and atraumatic.  Mouth/Throat: Oropharynx is clear and moist. No oropharyngeal exudate.  Eyes: Conjunctivae are normal. Pupils are equal, round, and reactive to light. Right eye exhibits no discharge. Left eye exhibits no discharge. No scleral icterus.  Neck: Normal range of motion. Neck supple. No thyromegaly present.  Cardiovascular: Normal rate, regular rhythm, normal heart sounds and intact distal pulses.  Exam reveals no gallop and no friction rub.   No murmur heard. Pulmonary/Chest: Effort normal and breath sounds normal. No stridor. No respiratory distress. He has no wheezes. He has no rales.  Abdominal: Soft. Bowel sounds are normal. He exhibits no distension. There is no tenderness. There is no rebound and no guarding.  Musculoskeletal: He exhibits no edema.  Lymphadenopathy:    He has no cervical adenopathy.  Neurological: He is alert. Coordination normal.  Skin: Skin is warm and dry. No rash noted. He is not diaphoretic. No erythema. No pallor.  Well healing superficial wounds to right wrist. No erythema, tenderness,  drainage, or lymphangitis noted.   Psychiatric: He has a normal mood and affect.  Nursing note and vitals reviewed.    ED Treatments / Results  DIAGNOSTIC STUDIES: Oxygen Saturation is 100% on RA, normal by my interpretation.   COORDINATION OF CARE: 8:06 PM-Discussed next steps with pt including his next rabies shot. Pt verbalized understanding and is agreeable with the plan.   Labs (all labs ordered are listed, but only abnormal results are  displayed) Labs Reviewed - No data to display  EKG  EKG Interpretation None       Radiology No results found.  Procedures Procedures (including critical care time)  Medications Ordered in ED Medications  rabies vaccine (RABAVERT) injection 1 mL (not administered)     Initial Impression / Assessment and Plan / ED Course  I have reviewed the triage vital signs and the nursing notes.  Pertinent labs & imaging results that were available during my care of the patient were reviewed by me and considered in my medical decision making (see chart for details).     Patient returns for second rabies vaccine. Patient's wounds are healing well. No signs of infection. Patient has been compliant with his Augmentin. Advised to finish all of this medication. Patient advised to return here or to urgent care for third rabies vaccine in series. Patient understands and agrees with plan. Patient vitals stable and discharged in satisfactory condition.  Final Clinical Impressions(s) / ED Diagnoses   Final diagnoses:  Encounter for repeat administration of rabies vaccination    New Prescriptions New Prescriptions   No medications on file   I personally performed the services described in this documentation, which was scribed in my presence. The recorded information has been reviewed and is accurate.     Emi Holes, PA-C 06/21/16 2057    Gerhard Munch, MD 06/22/16 1236

## 2016-06-21 NOTE — Discharge Instructions (Signed)
Please return or go to the urgent care across the street on your scheduled today for your next rabies vaccination. Please return to emergency department immediately if you develop any fever, increasing pain, redness, swelling, red streaking from around the wound.

## 2016-06-28 ENCOUNTER — Encounter (HOSPITAL_COMMUNITY): Payer: Self-pay | Admitting: Emergency Medicine

## 2016-06-28 ENCOUNTER — Emergency Department (HOSPITAL_COMMUNITY)
Admission: EM | Admit: 2016-06-28 | Discharge: 2016-06-28 | Disposition: A | Discharge status: Home / Self Care | Payer: Self-pay | Attending: Emergency Medicine | Admitting: Emergency Medicine

## 2016-06-28 DIAGNOSIS — Z203 Contact with and (suspected) exposure to rabies: Secondary | ICD-10-CM | POA: Insufficient documentation

## 2016-06-28 DIAGNOSIS — I10 Essential (primary) hypertension: Secondary | ICD-10-CM | POA: Insufficient documentation

## 2016-06-28 DIAGNOSIS — Z79899 Other long term (current) drug therapy: Secondary | ICD-10-CM | POA: Insufficient documentation

## 2016-06-28 MED ORDER — RABIES VACCINE, PCEC IM SUSR
1.0000 mL | Freq: Once | INTRAMUSCULAR | Status: AC
Start: 2016-06-28 — End: 2016-06-28
  Administered 2016-06-28: 1 mL via INTRAMUSCULAR
  Filled 2016-06-28: qty 1

## 2016-06-28 NOTE — ED Provider Notes (Signed)
[facility]-EMERGENCY DEPT Provider Note   CSN: [id number] Arrival date & time: 06/28/16  2233     History   Chief Complaint Chief Complaint  Patient presents with  . Rabies Injection    HPI Antwoin Lackey is a 56 y.o. male pseudohypertension here with rabies vaccination. He was bitten by a cat on 6/6. He was given rabies vaccination as well as a course of Augmentin. He had a second rabies vaccine about a week ago. He states that the wound is healing well and denies any drainage from it out or any fevers. He is here for his last rabies shot.  The history is provided by the patient.    Past Medical History:  Diagnosis Date  . Hypertension     Patient Active Problem List   Diagnosis Date Noted  . Obesity 07/20/2015  . Achilles tendinitis 04/09/2015  . Hypertension 03/04/2015  . Muscle spasm of back 03/04/2015    Past Surgical History:  Procedure Laterality Date  . EYE SURGERY    . TONSILLECTOMY         Home Medications    Prior to Admission medications   Medication Sig Start Date End Date Taking? Authorizing Provider  amoxicillin-clavulanate (AUGMENTIN) 875-125 MG tablet Take 1 tablet by mouth 2 (two) times daily. 06/14/16   Gilda Crease, MD    Family History Family History  Problem Relation Age of Onset  . Diabetes Mother   . Hypertension Mother     Social History Social History  Substance Use Topics  . Smoking status: Never Smoker  . Smokeless tobacco: Never Used  . Alcohol use No     Allergies   Banana   Review of Systems Review of Systems  Skin: Positive for wound.  All other systems reviewed and are negative.    Physical Exam Updated Vital Signs BP (!) 148/87 (BP Location: Left Arm)   Pulse 84   Temp 97.8 F (36.6 C) (Oral)   Resp 18   Ht 5\' 11"  (1.803 m)   Wt 132 kg (291 lb)   SpO2 97%   BMI 40.59 kg/m   Physical Exam  Constitutional: He is oriented to person, place, and time. He appears well-developed and  well-nourished.  HENT:  Head: Normocephalic.  Mouth/Throat: Oropharynx is clear and moist.  Eyes: EOM are normal. Pupils are equal, round, and reactive to light.  Neck: Normal range of motion. Neck supple.  Cardiovascular: Normal rate, regular rhythm and normal heart sounds.   Pulmonary/Chest: Effort normal and breath sounds normal. No respiratory distress. He has no wheezes.  Abdominal: Soft. Bowel sounds are normal. He exhibits no distension. There is no tenderness.  Musculoskeletal: Normal range of motion.  Neurological: He is alert and oriented to person, place, and time.  Skin:  R forearm with multiple bite marks that is healing well. No purulent discharge   Nursing note and vitals reviewed.    ED Treatments / Results  Labs (all labs ordered are listed, but only abnormal results are displayed) Labs Reviewed - No data to display  EKG  EKG Interpretation None       Radiology No results found.  Procedures Procedures (including critical care time)  Medications Ordered in ED Medications  rabies vaccine (RABAVERT) injection 1 mL (not administered)     Initial Impression / Assessment and Plan / ED Course  I have reviewed the triage vital signs and the nursing notes.  Pertinent labs & imaging results that were available during my care of  the patient were reviewed by me and considered in my medical decision making (see chart for details).    Gildardo PoundsMichael Maskell is a 56 y.o. male here with rabies vaccination. Wound appears healing well, no signs of infection. Will give last dose of rabies vaccine. Gave strict return precautions.    Final Clinical Impressions(s) / ED Diagnoses   Final diagnoses:  None    New Prescriptions New Prescriptions   No medications on file     Charlynne PanderYao, Jospeh Mangel Hsienta, MD 06/28/16 2345

## 2016-06-28 NOTE — ED Triage Notes (Signed)
Pt here for last rabies injection. From cat bite on 06/14/16

## 2016-06-28 NOTE — Discharge Instructions (Signed)
Take tylenol, motrin for pain.   The wound should continue to heal. Observe for redness and swelling.   See your doctor   Return to ER if you have worse redness, swelling, purulent drainage from the wound, fevers.

## 2016-07-14 ENCOUNTER — Ambulatory Visit: Payer: Self-pay | Attending: Internal Medicine | Admitting: Internal Medicine

## 2016-07-14 ENCOUNTER — Encounter: Payer: Self-pay | Admitting: Internal Medicine

## 2016-07-14 VITALS — BP 140/92 | HR 78 | Temp 98.0°F | Resp 16 | Wt 278.6 lb

## 2016-07-14 DIAGNOSIS — Z6838 Body mass index (BMI) 38.0-38.9, adult: Secondary | ICD-10-CM

## 2016-07-14 DIAGNOSIS — Z8249 Family history of ischemic heart disease and other diseases of the circulatory system: Secondary | ICD-10-CM | POA: Insufficient documentation

## 2016-07-14 DIAGNOSIS — Z1159 Encounter for screening for other viral diseases: Secondary | ICD-10-CM

## 2016-07-14 DIAGNOSIS — N183 Chronic kidney disease, stage 3 unspecified: Secondary | ICD-10-CM

## 2016-07-14 DIAGNOSIS — Z1211 Encounter for screening for malignant neoplasm of colon: Secondary | ICD-10-CM

## 2016-07-14 DIAGNOSIS — E669 Obesity, unspecified: Secondary | ICD-10-CM | POA: Insufficient documentation

## 2016-07-14 DIAGNOSIS — I129 Hypertensive chronic kidney disease with stage 1 through stage 4 chronic kidney disease, or unspecified chronic kidney disease: Secondary | ICD-10-CM | POA: Insufficient documentation

## 2016-07-14 DIAGNOSIS — Z114 Encounter for screening for human immunodeficiency virus [HIV]: Secondary | ICD-10-CM

## 2016-07-14 DIAGNOSIS — I10 Essential (primary) hypertension: Secondary | ICD-10-CM

## 2016-07-14 DIAGNOSIS — Z833 Family history of diabetes mellitus: Secondary | ICD-10-CM | POA: Insufficient documentation

## 2016-07-14 DIAGNOSIS — E6609 Other obesity due to excess calories: Secondary | ICD-10-CM

## 2016-07-14 MED ORDER — AMLODIPINE BESYLATE 5 MG PO TABS: 5.0000 mg | ORAL_TABLET | Freq: Every day | ORAL | 3 refills | Status: DC

## 2016-07-14 MED FILL — AMLODIPINE BESYLATE 5 MG TA: 5 | 90 days supply | Qty: 90 | Fill #0

## 2016-07-14 NOTE — Patient Instructions (Signed)
Start amlodipine 5 mg daily for better blood pressure control.  Avoid over-the-counter pain medicines as discussed due to your kidney function.

## 2016-07-14 NOTE — Progress Notes (Signed)
Patient ID: Lonnie Brown, male    DOB: January 14, 1960  MRN: [id number]  CC: re-establish and Hypertension   Subjective: Lonnie Brown is a 56 y.o. male who presents for chronic ds management His concerns today include:  History of HTN, CKD, obesity  1. Obesity -loss some wgh recently intertionally -"I have cut back on bread and sweets so much.  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 HTN: -stopped Lisinopril 4 wks ago due to angioedema of lips and neck.  Seen in ER.   -limits salt in foods -no HA/dizziness/CP/SOB  3. CKD 3  -he is aware of dec kidney function  HM: due for colonoscopy.   Patient Active Problem List   Diagnosis Date Noted  . Obesity 07/20/2015  . Achilles tendinitis 04/09/2015  . Hypertension 03/04/2015  . Muscle spasm of back 03/04/2015     Current Outpatient Prescriptions on File Prior to Visit  Medication Sig Dispense Refill  . amoxicillin-clavulanate (AUGMENTIN) 875-125 MG tablet Take 1 tablet by mouth 2 (two) times daily. (Patient not taking: Reported on 07/14/2016) 20 tablet 0   No current facility-administered medications on file prior to visit.     Allergies  Allergen Reactions  . Banana Nausea And Vomiting    Social History   Social History  . Marital status: Married    Spouse name: N/A  . Number of children: N/A  . Years of education: N/A   Occupational History  . Not on file.   Social History Main Topics  . Smoking status: Never Smoker  . Smokeless tobacco: Never Used  . Alcohol use No  . Drug use: No  . Sexual activity: Not on file   Other Topics Concern  . Not on file   Social History Narrative  . No narrative on file    Family History  Problem Relation Age of Onset  . Diabetes Mother   .  Hypertension Mother     Past Surgical History:  Procedure Laterality Date  . EYE SURGERY    . TONSILLECTOMY      ROS: Review of Systems  Eyes: Negative for visual disturbance.  Respiratory: Negative for cough and shortness of breath.   Cardiovascular: Negative for palpitations.  Neurological: Negative for dizziness and light-headedness.    PHYSICAL EXAM: BP (!) 140/92   Pulse 78   Temp 98 F (36.7 C) (Oral)   Resp 16   Wt 278 lb 9.6 oz (126.4 kg)   SpO2 95%   BMI 38.86 kg/m   140/92 Physical Exam  General appearance - alert, well appearing, obese middle-age African-American male and in no distress Mental status - alert, oriented to person, place, and time, normal mood, behavior, speech, dress, motor activity, and thought processes Mouth - mucous membranes moist, pharynx normal without lesions Neck - supple, no significant adenopathy Chest - clear to auscultation, no wheezes, rales or rhonchi, symmetric air entry Heart - normal rate, regular rhythm, normal S1, S2, no murmurs, rubs, clicks or gallops Extremities - peripheral pulses normal, no pedal edema, no clubbing or cyanosis   ASSESSMENT AND PLAN: 1. Essential hypertension Close to goal.   Lis/HCT d/c in ER due to angioedema. -Norvasc. -DASH diet discussed - Lipid Panel -  amLODipine (NORVASC) 5 MG tablet; Take 1 tablet (5 mg total) by mouth daily.  Dispense: 90 tablet; Refill: 3  2. Class 2 obesity due to excess calories without serious comorbidity with body mass index (BMI) of 38.0 to 38.9 in adult -Commended him on weight loss so far.  encouraged him to continue healthy eating habits and to stay active  3. CKD (chronic kidney disease) stage 3, GFR 30-59 ml/min -Discussed importance of good blood pressure control -Patient told to avoid taking NSAIDs  4. Colon cancer screening - Ambulatory referral to Gastroenterology  5. Need for hepatitis C screening test - Hepatitis c antibody (reflex)  6. Screening  for HIV (human immunodeficiency virus) - HIV antibody   Patient was given the opportunity to ask questions.  Patient verbalized understanding of the plan and was able to repeat key elements of the plan.   Orders Placed This Encounter  Procedures  . Lipid Panel  . HIV antibody  . Hepatitis c antibody (reflex)  . Ambulatory referral to Gastroenterology     Requested Prescriptions   Signed Prescriptions Disp Refills  . amLODipine (NORVASC) 5 MG tablet 90 tablet 3    Sig: Take 1 tablet (5 mg total) by mouth daily.    Return in about 3 months (around 10/14/2016).  Lonnie Blueeborah Johnson, MD, FACP

## 2016-07-15 ENCOUNTER — Other Ambulatory Visit: Payer: Self-pay | Admitting: Internal Medicine

## 2016-07-15 LAB — HEPATITIS C ANTIBODY (REFLEX): HCV Ab: <0.1 s/co ratio (ref 0.0–0.9)

## 2016-07-15 LAB — LIPID PANEL
CHOLESTEROL TOTAL: 175 mg/dL (ref 100–199)
Chol/HDL Ratio: 5.5 ratio — ABNORMAL HIGH (ref 0.0–5.0)
HDL: 32 mg/dL — AB (ref >39)
LDL Calculated: 111 mg/dL — ABNORMAL HIGH (ref 0–99)
TRIGLYCERIDES: 162 mg/dL — AB (ref 0–149)
VLDL CHOLESTEROL CAL: 32 mg/dL (ref 5–40)

## 2016-07-15 LAB — HCV COMMENT:

## 2016-07-15 LAB — HIV ANTIBODY (ROUTINE TESTING W REFLEX): HIV Screen 4th Generation wRfx: NONREACTIVE

## 2016-07-15 MED ORDER — ATORVASTATIN CALCIUM 10 MG PO TABS: 10.0000 mg | ORAL_TABLET | Freq: Every day | ORAL | 3 refills | Status: DC

## 2016-07-15 NOTE — Progress Notes (Signed)
Labs reviewed.  ASCVD risk score is 22%.  Will recommend starting statin.  Results sent to pt via Mychart. Results for orders placed or performed in visit on 07/14/16  Lipid Panel  Result Value Ref Range   Cholesterol, Total 175 100 - 199 mg/dL   Triglycerides 161162 (H) 0 - 149 mg/dL   HDL 32 (L) >09>39 mg/dL   VLDL Cholesterol Cal 32 5 - 40 mg/dL   LDL Calculated 604111 (H) 0 - 99 mg/dL   Chol/HDL Ratio 5.5 (H) 0.0 - 5.0 ratio  HIV antibody  Result Value Ref Range   HIV Screen 4th Generation wRfx Non Reactive Non Reactive  Hepatitis c antibody (reflex)  Result Value Ref Range   HCV Ab <0.1 0.0 - 0.9 s/co ratio  HCV Comment:  Result Value Ref Range   Comment: Comment

## 2016-09-25 ENCOUNTER — Encounter: Payer: Self-pay | Admitting: Internal Medicine

## 2016-09-25 ENCOUNTER — Ambulatory Visit: Payer: Self-pay | Attending: Internal Medicine | Admitting: Internal Medicine

## 2016-09-25 VITALS — BP 141/91 | HR 76 | Temp 98.4°F | Resp 16 | Wt 294.6 lb

## 2016-09-25 DIAGNOSIS — I10 Essential (primary) hypertension: Secondary | ICD-10-CM

## 2016-09-25 DIAGNOSIS — R7303 Prediabetes: Secondary | ICD-10-CM

## 2016-09-25 DIAGNOSIS — Z9889 Other specified postprocedural states: Secondary | ICD-10-CM | POA: Insufficient documentation

## 2016-09-25 DIAGNOSIS — M7660 Achilles tendinitis, unspecified leg: Secondary | ICD-10-CM | POA: Insufficient documentation

## 2016-09-25 DIAGNOSIS — N183 Chronic kidney disease, stage 3 (moderate): Secondary | ICD-10-CM | POA: Insufficient documentation

## 2016-09-25 DIAGNOSIS — L299 Pruritus, unspecified: Secondary | ICD-10-CM

## 2016-09-25 DIAGNOSIS — Z833 Family history of diabetes mellitus: Secondary | ICD-10-CM | POA: Insufficient documentation

## 2016-09-25 DIAGNOSIS — Z8249 Family history of ischemic heart disease and other diseases of the circulatory system: Secondary | ICD-10-CM | POA: Insufficient documentation

## 2016-09-25 DIAGNOSIS — I129 Hypertensive chronic kidney disease with stage 1 through stage 4 chronic kidney disease, or unspecified chronic kidney disease: Secondary | ICD-10-CM | POA: Insufficient documentation

## 2016-09-25 DIAGNOSIS — Z888 Allergy status to other drugs, medicaments and biological substances status: Secondary | ICD-10-CM | POA: Insufficient documentation

## 2016-09-25 DIAGNOSIS — Z6841 Body Mass Index (BMI) 40.0 and over, adult: Secondary | ICD-10-CM

## 2016-09-25 DIAGNOSIS — E785 Hyperlipidemia, unspecified: Secondary | ICD-10-CM

## 2016-09-25 DIAGNOSIS — M62830 Muscle spasm of back: Secondary | ICD-10-CM | POA: Insufficient documentation

## 2016-09-25 DIAGNOSIS — M62838 Other muscle spasm: Secondary | ICD-10-CM

## 2016-09-25 DIAGNOSIS — Z23 Encounter for immunization: Secondary | ICD-10-CM

## 2016-09-25 LAB — POCT GLYCOSYLATED HEMOGLOBIN (HGB A1C): Hemoglobin A1C: 6.3

## 2016-09-25 LAB — GLUCOSE, POCT (MANUAL RESULT ENTRY): POC Glucose: 131 mg/dl — AB (ref 70–99)

## 2016-09-25 MED ORDER — ATORVASTATIN CALCIUM 10 MG PO TABS: 10.0000 mg | ORAL_TABLET | Freq: Every day | ORAL | 3 refills | Status: DC

## 2016-09-25 MED ORDER — METHOCARBAMOL 500 MG PO TABS: 500.0000 mg | ORAL_TABLET | Freq: Two times a day (BID) | ORAL | 1 refills | Status: DC | PRN

## 2016-09-25 MED ORDER — METFORMIN HCL 500 MG PO TABS: 500.0000 mg | ORAL_TABLET | Freq: Every day | ORAL | 3 refills | Status: DC

## 2016-09-25 MED ORDER — AMLODIPINE BESYLATE 10 MG PO TABS: 10.0000 mg | ORAL_TABLET | Freq: Every day | ORAL | 6 refills | Status: DC

## 2016-09-25 NOTE — Patient Instructions (Addendum)
Please give patient forms to apply for [employer].   Increase Amlodipine to 10 mg daily to better control your blood pressure. Start Atorvastatin to help lower your cholesterol and over all risk for heart attack and stroke.  Use Robaxin as needed for muscle spasms.  This medication can cause drowsiness.  Please sign up for [facility] discount so that we can refer you for colonoscopy.    Prediabetes Prediabetes is the condition of having a blood sugar (blood glucose) level that is higher than it should be, but not high enough for you to be diagnosed with type 2 diabetes. Having prediabetes puts you at risk for developing type 2 diabetes (type 2 diabetes mellitus). Prediabetes may be called impaired glucose tolerance or impaired fasting glucose. Prediabetes usually does not cause symptoms. Your health care provider can diagnose this condition with blood tests. You may be tested for prediabetes if you are overweight and if you have at least one other risk factor for prediabetes. Risk factors for prediabetes include:  Having a family member with type 2 diabetes.  Being overweight or obese.  Being older than age 84.  Being of American-Indian, African-American, Hispanic/Latino, or Asian/Pacific Islander descent.  Having an inactive (sedentary) lifestyle.  Having a history of gestational diabetes or polycystic ovarian syndrome (PCOS).  Having low levels of good cholesterol (HDL-C) or high levels of blood fats (triglycerides).  Having high blood pressure.  What is blood glucose and how is blood glucose measured?  Blood glucose refers to the amount of glucose in your bloodstream. Glucose comes from eating foods that contain sugars and starches (carbohydrates) that the body breaks down into glucose. Your blood glucose level may be measured in mg/dL (milligrams per deciliter) or mmol/L (millimoles per liter).Your blood glucose may be checked with one or more of the  following blood tests:  A fasting blood glucose (FBG) test. You will not be allowed to eat (you will fast) for at least 8 hours before a blood sample is taken. ? A normal range for FBG is 70-100 mg/dl (1.6-1.0 mmol/L).  An A1c (hemoglobin A1c) blood test. This test provides information about blood glucose control over the previous 2?3months.  An oral glucose tolerance test (OGTT). This test measures your blood glucose twice: ? After fasting. This is your baseline level. ? Two hours after you drink a beverage that contains glucose.  You may be diagnosed with prediabetes:  If your FBG is 100?125 mg/dL (9.6-0.4 mmol/L).  If your A1c level is 5.7?6.4%.  If your OGGT result is 140?199 mg/dL (5.4-09 mmol/L).  These blood tests may be repeated to confirm your diagnosis. What happens if blood glucose is too high? The pancreas produces a hormone (insulin) that helps move glucose from the bloodstream into cells. When cells in the body do not respond properly to insulin that the body makes (insulin resistance), excess glucose builds up in the blood instead of going into cells. As a result, high blood glucose (hyperglycemia) can develop, which can cause many complications. This is a symptom of prediabetes. What can happen if blood glucose stays higher than normal for a long time? Having high blood glucose for a long time is dangerous. Too much glucose in your blood can damage your nerves and blood vessels. Long-term damage can lead to complications from diabetes, which may include:  Heart disease.  Stroke.  Blindness.  Kidney disease.  Depression.  Poor circulation in the feet and legs, which could lead to surgical removal (amputation) in  severe cases.  How can prediabetes be prevented from turning into type 2 diabetes?  To help prevent type 2 diabetes, take the following actions:  Be physically active. ? Do moderate-intensity physical activity for at least 30 minutes on at least 5  days of the week, or as much as told by your health care provider. This could be brisk walking, biking, or water aerobics. ? Ask your health care provider what activities are safe for you. A mix of physical activities may be best, such as walking, swimming, cycling, and strength training.  Lose weight as told by your health care provider. ? Losing 5-7% of your body weight can reverse insulin resistance. ? Your health care provider can determine how much weight loss is best for you and can help you lose weight safely.  Follow a healthy meal plan. This includes eating lean proteins, complex carbohydrates, fresh fruits and vegetables, low-fat dairy products, and healthy fats. ? Follow instructions from your health care provider about eating or drinking restrictions. ? Make an appointment to see a diet and nutrition specialist (registered dietitian) to help you create a healthy eating plan that is right for you.  Do not smoke or use any tobacco products, such as cigarettes, chewing tobacco, and e-cigarettes. If you need help quitting, ask your health care provider.  Take over-the-counter and prescription medicines as told by your health care provider. You may be prescribed medicines that help lower the risk of type 2 diabetes.  This information is not intended to replace advice given to you by your health care provider. Make sure you discuss any questions you have with your health care provider. Document Released: 04/19/2015 Document Revised: 06/03/2015 Document Reviewed: 02/16/2015 Elsevier Interactive Patient Education  2018 Elsevier Inc.   Influenza Virus Vaccine injection (Fluarix) What is this medicine? INFLUENZA VIRUS VACCINE (in floo EN zuh VAHY ruhs vak SEEN) helps to reduce the risk of getting influenza also known as the flu. This medicine may be used for other purposes; ask your health care provider or pharmacist if you have questions. COMMON BRAND NAME(S): Fluarix, Fluzone What  should I tell my health care provider before I take this medicine? They need to know if you have any of these conditions: -bleeding disorder like hemophilia -fever or infection -Guillain-Barre syndrome or other neurological problems -immune system problems -infection with the human immunodeficiency virus (HIV) or AIDS -low blood platelet counts -multiple sclerosis -an unusual or allergic reaction to influenza virus vaccine, eggs, chicken proteins, latex, gentamicin, other medicines, foods, dyes or preservatives -pregnant or trying to get pregnant -breast-feeding How should I use this medicine? This vaccine is for injection into a muscle. It is given by a health care professional. A copy of Vaccine Information Statements will be given before each vaccination. Read this sheet carefully each time. The sheet may change frequently. Talk to your pediatrician regarding the use of this medicine in children. Special care may be needed. Overdosage: If you think you have taken too much of this medicine contact a poison control center or emergency room at once. NOTE: This medicine is only for you. Do not share this medicine with others. What if I miss a dose? This does not apply. What may interact with this medicine? -chemotherapy or radiation therapy -medicines that lower your immune system like etanercept, anakinra, infliximab, and adalimumab -medicines that treat or prevent blood clots like warfarin -phenytoin -steroid medicines like prednisone or cortisone -theophylline -vaccines This list may not describe all possible interactions. Give your  health care provider a list of all the medicines, herbs, non-prescription drugs, or dietary supplements you use. Also tell them if you smoke, drink alcohol, or use illegal drugs. Some items may interact with your medicine. What should I watch for while using this medicine? Report any side effects that do not go away within 3 days to your doctor or health  care professional. Call your health care provider if any unusual symptoms occur within 6 weeks of receiving this vaccine. You may still catch the flu, but the illness is not usually as bad. You cannot get the flu from the vaccine. The vaccine will not protect against colds or other illnesses that may cause fever. The vaccine is needed every year. What side effects may I notice from receiving this medicine? Side effects that you should report to your doctor or health care professional as soon as possible: -allergic reactions like skin rash, itching or hives, swelling of the face, lips, or tongue Side effects that usually do not require medical attention (report to your doctor or health care professional if they continue or are bothersome): -fever -headache -muscle aches and pains -pain, tenderness, redness, or swelling at site where injected -weak or tired This list may not describe all possible side effects. Call your doctor for medical advice about side effects. You may report side effects to FDA at 1-800-FDA-1088. Where should I keep my medicine? This vaccine is only given in a clinic, pharmacy, doctor's office, or other health care setting and will not be stored at home. NOTE: This sheet is a summary. It may not cover all possible information. If you have questions about this medicine, talk to your doctor, pharmacist, or health care provider.  2018 Elsevier/Gold Standard (2007-07-24 09:30:40)

## 2016-09-25 NOTE — Progress Notes (Signed)
Patient Lonnie Brown, male    DOB: [birth year]  MRN: [id number]  CC: itichiness (hands)   Subjective: Lonnie Brown is a 56 y.o. male who presents for chronic disease management. Last seen by me in July His concerns today include:  History of HTN, CKD stage 3, obesity  1. C/o itching of hands in cool air x 3 wks. -no pain, swelling, numbness or tingling. No rash -not working with any chemicals  2. HTN:  -no device to check Compliant with Norvasc and salt restriction -no CP/SOB/Le edema  3. Gets spasms in lower back after walking about 2 blocks. Has to stop and rest for 5-10 mins. -no pains/cramps in legs -gained 16 lbs since last visit. "I've been eating too much."  4.  HL: sent lab results via Mychart.  He did not check his messages. His ASCVD risk score was 22%. I had recommended starting Lipitor. He did not get the message so as not taking it  HM: referred for colonoscopy on last visit. No insurance.  Patient Active Problem List   Diagnosis Date Noted  . Obesity 07/20/2015  . Achilles tendinitis 04/09/2015  . Hypertension 03/04/2015  . Muscle spasm of back 03/04/2015     Current Outpatient Prescriptions on File Prior to Visit  Medication Sig Dispense Refill  . amoxicillin-clavulanate (AUGMENTIN) 875-125 MG tablet Take 1 tablet by mouth 2 (two) times daily. (Patient not taking: Reported on 07/14/2016) 20 tablet 0   No current facility-administered medications on file prior to visit.     Allergies  Allergen Reactions  . Lisinopril Other (See Comments)    Angioedema  . Banana Nausea And Vomiting    Social History   Social History  . Marital status: Married    Spouse name: N/A  . Number of children: N/A  . Years of education: N/A   Occupational History  . Not on file.   Social History Main Topics  . Smoking status: Never Smoker  . Smokeless tobacco: Never Used  . Alcohol use No  . Drug use: No  . Sexual activity: Not on file   Other  Topics Concern  . Not on file   Social History Narrative  . No narrative on file    Family History  Problem Relation Age of Onset  . Diabetes Mother   . Hypertension Mother     Past Surgical History:  Procedure Laterality Date  . EYE SURGERY    . TONSILLECTOMY      ROS: Review of Systems Negative except as stated above PHYSICAL EXAM: BP (!) 141/91   Pulse 76   Temp 98.4 F (36.9 C) (Oral)   Resp 16   Wt 294 lb 9.6 oz (133.6 kg)   SpO2 99%   BMI 41.09 kg/m   Wt Readings from Last 3 Encounters:  09/25/16 294 lb 9.6 oz (133.6 kg)  07/14/16 278 lb 9.6 oz (126.4 kg)  06/28/16 291 lb (132 kg)    Physical Exam  General appearance - alert, well appearing, Obese middle-age African-American male and in no distress Mental status - alert, oriented to person, place, and time, normal mood, behavior, speech, dress, motor activity, and thought processes Neck - supple, no significant adenopathy Chest - clear to auscultation, no wheezes, rales or rhonchi, symmetric air entry Heart - normal rate, regular rhythm, normal S1, S2, no murmurs, rubs, clicks or gallops Extremities - peripheral pulses normal, no pedal edema, no clubbing or cyanosis Skin -no rash seen on the hands. Good  capillary refill. Warm  Results for orders placed or performed in visit on 09/25/16  HgB A1c  Result Value Ref Range   Hemoglobin A1C 6.3   POCT glucose (manual entry)  Result Value Ref Range   POC Glucose 131 (A) 70 - 99 mg/dl   Results for orders placed or performed in visit on 09/25/16  HgB A1c  Result Value Ref Range   Hemoglobin A1C 6.3   POCT glucose (manual entry)  Result Value Ref Range   POC Glucose 131 (A) 70 - 99 mg/dl    ASSESSMENT AND PLAN: 1. Essential hypertension Not at goal. Continue DASH diet. Increase Norvasc to 10 mg daily - amLODipine (NORVASC) 10 MG tablet; Take 1 tablet (10 mg total) by mouth daily.  Dispense: 30 tablet; Refill: 6  2. Muscle spasm -Low back with  walking. Patient given Robaxin to use as needed. Patient informed that the medication can cause some drowsiness.  3. Class 3 severe obesity due to excess calories without serious comorbidity with body mass index (BMI) of 40.0 to 44.9 in adult [facility]) -Discussed healthy eating habits. He will cut back on white carbohydrates, he will avoid drinking sweet beverages and he will cut back on portion sizes. He plans to continue trying to walk at least 3-4 times a week for 15-20 minutes. - HgB A1c  4. Need for influenza vaccination - Flu Vaccine QUAD 6+ mos PF IM (Fluarix Quad PF)  5. Hyperlipidemia, unspecified hyperlipidemia type -Patient agreeable to starting Lipitor. LFTs done earlier this year were within normal limits - atorvastatin (LIPITOR) 10 MG tablet; Take 1 tablet (10 mg total) by mouth daily.  Dispense: 90 tablet; Refill: 3  6. Itching Questionable etiology. Since it occurs only in cold weather I have recommended wearing gloves  7. Prediabetes See #3 above. I have recommended starting low dose of metformin. Patient is agreeable to this. - POCT glucose (manual entry) - metFORMIN (GLUCOPHAGE) 500 MG tablet; Take 1 tablet (500 mg total) by mouth daily with breakfast.  Dispense: 90 tablet; Refill: 3   Patient was given the opportunity to ask questions.  Patient verbalized understanding of the plan and was able to repeat key elements of the plan.   Orders Placed This Encounter  Procedures  . Flu Vaccine QUAD 6+ mos PF IM (Fluarix Quad PF)  . HgB A1c  . POCT glucose (manual entry)     Requested Prescriptions   Signed Prescriptions Disp Refills  . amLODipine (NORVASC) 10 MG tablet 30 tablet 6    Sig: Take 1 tablet (10 mg total) by mouth daily.  Marland Kitchen atorvastatin (LIPITOR) 10 MG tablet 90 tablet 3    Sig: Take 1 tablet (10 mg total) by mouth daily.  . methocarbamol (ROBAXIN) 500 MG tablet 20 tablet 1    Sig: Take 1 tablet (500 mg total) by mouth 2 (two) times daily as needed for  muscle spasms.  . metFORMIN (GLUCOPHAGE) 500 MG tablet 90 tablet 3    Sig: Take 1 tablet (500 mg total) by mouth daily with breakfast.    Return in about 3 months (around 12/25/2016).  Jonah Blue, MD, FACP

## 2016-09-26 MED FILL — ?METFORMIN HCL 500MG TABLET: 500 | 30 days supply | Qty: 30 | Fill #0

## 2016-09-26 MED FILL — METHOCARBAMOL 500 MG TABS: 500 | 10 days supply | Qty: 20 | Fill #0

## 2016-09-26 MED FILL — AMLODIPINE BESYLATE 10 MG T: 10 | 30 days supply | Qty: 30 | Fill #0

## 2016-09-26 MED FILL — ?ATORVASTATIN 10 MG TABLET: 10 | 30 days supply | Qty: 30 | Fill #0

## 2016-11-27 MED FILL — AMLODIPINE BESYLATE 10 MG T: 10 | 30 days supply | Qty: 30 | Fill #1

## 2016-11-27 MED FILL — ?ATORVASTATIN 10 MG TABLET: 10 | 30 days supply | Qty: 30 | Fill #1

## 2016-11-27 MED FILL — ?METFORMIN HCL 500MG TABLET: 500 | 30 days supply | Qty: 30 | Fill #1

## 2016-12-30 ENCOUNTER — Encounter (HOSPITAL_COMMUNITY): Payer: Self-pay | Admitting: *Deleted

## 2016-12-30 ENCOUNTER — Emergency Department (HOSPITAL_COMMUNITY)
Admission: EM | Admit: 2016-12-30 | Discharge: 2016-12-30 | Disposition: A | Discharge status: Home / Self Care | Payer: Self-pay | Attending: Emergency Medicine | Admitting: Emergency Medicine

## 2016-12-30 ENCOUNTER — Other Ambulatory Visit: Payer: Self-pay

## 2016-12-30 ENCOUNTER — Emergency Department (HOSPITAL_COMMUNITY): Payer: Self-pay

## 2016-12-30 DIAGNOSIS — Z79899 Other long term (current) drug therapy: Secondary | ICD-10-CM | POA: Insufficient documentation

## 2016-12-30 DIAGNOSIS — I10 Essential (primary) hypertension: Secondary | ICD-10-CM | POA: Insufficient documentation

## 2016-12-30 DIAGNOSIS — R609 Edema, unspecified: Secondary | ICD-10-CM | POA: Insufficient documentation

## 2016-12-30 DIAGNOSIS — R0602 Shortness of breath: Secondary | ICD-10-CM | POA: Insufficient documentation

## 2016-12-30 LAB — CBC
HEMATOCRIT: 38.8 % — AB (ref 39.0–52.0)
Hemoglobin: 13.1 g/dL (ref 13.0–17.0)
MCH: 29.4 pg (ref 26.0–34.0)
MCHC: 33.8 g/dL (ref 30.0–36.0)
MCV: 87.2 fL (ref 78.0–100.0)
Platelets: 267 10*3/uL (ref 150–400)
RBC: 4.45 MIL/uL (ref 4.22–5.81)
RDW: 13.1 % (ref 11.5–15.5)
WBC: 6.7 10*3/uL (ref 4.0–10.5)

## 2016-12-30 LAB — BASIC METABOLIC PANEL
ANION GAP: 7 (ref 5–15)
BUN: 16 mg/dL (ref 6–20)
CHLORIDE: 108 mmol/L (ref 101–111)
CO2: 23 mmol/L (ref 22–32)
Calcium: 8.9 mg/dL (ref 8.9–10.3)
Creatinine, Ser: 1.37 mg/dL — ABNORMAL HIGH (ref 0.61–1.24)
GFR, EST NON AFRICAN AMERICAN: 55 mL/min — AB (ref >60)
Glucose, Bld: 121 mg/dL — ABNORMAL HIGH (ref 65–99)
POTASSIUM: 4.1 mmol/L (ref 3.5–5.1)
SODIUM: 138 mmol/L (ref 135–145)

## 2016-12-30 LAB — TROPONIN I: Troponin I: <0.03 ng/mL (ref <0.03)

## 2016-12-30 LAB — BRAIN NATRIURETIC PEPTIDE: B Natriuretic Peptide: 7.7 pg/mL (ref 0.0–100.0)

## 2016-12-30 MED ORDER — IPRATROPIUM-ALBUTEROL 0.5-2.5 (3) MG/3ML IN SOLN
3.0000 mL | Freq: Once | RESPIRATORY_TRACT | Status: AC
  Administered 2016-12-30: 3 mL via RESPIRATORY_TRACT
  Filled 2016-12-30: qty 3

## 2016-12-30 MED ORDER — FUROSEMIDE 40 MG PO TABS: 40.0000 mg | ORAL_TABLET | Freq: Every day | ORAL | 0 refills | Status: DC

## 2016-12-30 NOTE — ED Triage Notes (Signed)
The pt woke up approx one hour ago very sob  n hx of the same a ling time ago  He had fluid in his lungs then. His breathing is worse when ne lies down.  No exertional sob  No pain anywhere

## 2016-12-30 NOTE — ED Provider Notes (Signed)
[facility] Provider Note   CSN: [id number] Arrival date & time: 12/30/16  0447     History   Chief Complaint Chief Complaint  Patient presents with  . Shortness of Breath    HPI Lonnie Brown is a 56 y.o. male who presents with shortness of breath that woke him up around 3 AM this morning.  Patient was unable to get back to sleep because every time he laid flat he felt short of breath.  Patient has had this in the past and was evaluated 04/2015, however has not had a since last visit.  Patient denies any cough.  He has had some lower extremity edema.  He endorses eating more salt than usual recently.  He denies chest pain, abdominal pain, nausea, vomiting, urinary symptoms, recent long trips, surgeries, known cancer, history of blood clots.  He did not take any medications for symptoms prior to arrival.  HPI  Past Medical History:  Diagnosis Date  . Hypertension     Patient Active Problem List   Diagnosis Date Noted  . Hyperlipidemia 09/25/2016  . Obesity 07/20/2015  . Achilles tendinitis 04/09/2015  . Hypertension 03/04/2015  . Muscle spasm of back 03/04/2015    Past Surgical History:  Procedure Laterality Date  . EYE SURGERY    . TONSILLECTOMY         Home Medications    Prior to Admission medications   Medication Sig Start Date End Date Taking? Authorizing Provider  amLODipine (NORVASC) 10 MG tablet Take 1 tablet (10 mg total) by mouth daily. 09/25/16   Marcine MatarJohnson, Deborah B, MD  amoxicillin-clavulanate (AUGMENTIN) 875-125 MG tablet Take 1 tablet by mouth 2 (two) times daily. Patient not taking: Reported on 07/14/2016 06/14/16   Gilda CreasePollina, Christopher J, MD  atorvastatin (LIPITOR) 10 MG tablet Take 1 tablet (10 mg total) by mouth daily. 09/25/16   Marcine MatarJohnson, Deborah B, MD  furosemide (LASIX) 40 MG tablet Take 1 tablet (40 mg total) by mouth daily for 3 days. 12/30/16 01/02/17  Emi HolesLaw, Nalini Alcaraz M, PA-C  metFORMIN (GLUCOPHAGE) 500 MG  tablet Take 1 tablet (500 mg total) by mouth daily with breakfast. 09/25/16   Marcine MatarJohnson, Deborah B, MD  methocarbamol (ROBAXIN) 500 MG tablet Take 1 tablet (500 mg total) by mouth 2 (two) times daily as needed for muscle spasms. 09/25/16   Marcine MatarJohnson, Deborah B, MD    Family History Family History  Problem Relation Age of Onset  . Diabetes Mother   . Hypertension Mother     Social History Social History   Tobacco Use  . Smoking status: Never Smoker  . Smokeless tobacco: Never Used  Substance Use Topics  . Alcohol use: No  . Drug use: No     Allergies   Lisinopril and Banana   Review of Systems Review of Systems  Constitutional: Negative for chills and fever.  HENT: Negative for facial swelling and sore throat.   Respiratory: Positive for shortness of breath.   Cardiovascular: Positive for leg swelling. Negative for chest pain.  Gastrointestinal: Negative for abdominal pain, nausea and vomiting.  Genitourinary: Negative for dysuria.  Musculoskeletal: Negative for back pain.  Skin: Negative for rash and wound.  Neurological: Negative for headaches.  Psychiatric/Behavioral: The patient is not nervous/anxious.      Physical Exam Updated Vital Signs BP (!) 155/103 (BP Location: Right Arm)   Pulse 81   Temp 98 F (36.7 C) (Oral)   Resp 20   Ht 5\' 11"  (1.803 m)  Wt 127 kg (280 lb)   SpO2 99%   BMI 39.05 kg/m   Physical Exam  Constitutional: He appears well-developed and well-nourished. No distress.  Obese  HENT:  Head: Normocephalic and atraumatic.  Mouth/Throat: Oropharynx is clear and moist. No oropharyngeal exudate.  Eyes: Conjunctivae are normal. Pupils are equal, round, and reactive to light. Right eye exhibits no discharge. Left eye exhibits no discharge. No scleral icterus.  Neck: Normal range of motion. Neck supple. No thyromegaly present.  Cardiovascular: Normal rate, regular rhythm, normal heart sounds and intact distal pulses. Exam reveals no gallop and no  friction rub.  No murmur heard. Pulmonary/Chest: Effort normal. No stridor. No respiratory distress. He has no wheezes. He has rales (faint bilateral bases).  Abdominal: Soft. Bowel sounds are normal. He exhibits no distension. There is no tenderness. There is no rebound and no guarding.  Musculoskeletal:       Right lower leg: He exhibits edema (1+ pitting).       Left lower leg: He exhibits edema (1+ pitting).  Lymphadenopathy:    He has no cervical adenopathy.  Neurological: He is alert. Coordination normal.  Skin: Skin is warm and dry. No rash noted. He is not diaphoretic. No pallor.  Psychiatric: He has a normal mood and affect.  Nursing note and vitals reviewed.    ED Treatments / Results  Labs (all labs ordered are listed, but only abnormal results are displayed) Labs Reviewed  BASIC METABOLIC PANEL - Abnormal; Notable for the following components:      Result Value   Glucose, Bld 121 (*)    Creatinine, Ser 1.37 (*)    GFR calc non Af Amer 55 (*)    All other components within normal limits  CBC - Abnormal; Notable for the following components:   HCT 38.8 (*)    All other components within normal limits  TROPONIN I  BRAIN NATRIURETIC PEPTIDE    EKG  EKG Interpretation  Date/Time:  Saturday December 30 2016 04:51:51 EST Ventricular Rate:  79 PR Interval:  164 QRS Duration: 110 QT Interval:  382 QTC Calculation: 438 R Axis:   11 Text Interpretation:  Normal sinus rhythm Inferior infarct , age undetermined Anterior infarct , age undetermined Abnormal ECG When compared with ECG of 06/17/2016, No significant change was found Confirmed by Dione BoozeGlick, David ([id number]) on 12/30/2016 5:00:10 AM       Radiology Dg Chest 2 View  Result Date: 12/30/2016 CLINICAL DATA:  Shortness of breath EXAM: CHEST  2 VIEW COMPARISON:  Chest radiograph 04/19/2015 FINDINGS: The heart size and mediastinal contours are within normal limits. Both lungs are clear. The visualized skeletal structures  are unremarkable. IMPRESSION: No active cardiopulmonary disease. Electronically Signed   By: Deatra RobinsonKevin  Herman M.D.   On: 12/30/2016 05:38    Procedures Procedures (including critical care time)  Medications Ordered in ED Medications  ipratropium-albuterol (DUONEB) 0.5-2.5 (3) MG/3ML nebulizer solution 3 mL (3 mLs Nebulization Given 12/30/16 0719)     Initial Impression / Assessment and Plan / ED Course  I have reviewed the triage vital signs and the nursing notes.  Pertinent labs & imaging results that were available during my care of the patient were reviewed by me and considered in my medical decision making (see chart for details).     Patient with fluid overload.  Although BNP is 7.7, patient may have symptoms of new CHF.  Labs otherwise within normal limits with the exception of creatinine 1.37.  Chest x-ray is  negative.  EKG shows no changes since last tracing, NSR.  Patient also may have symptoms from sleep apnea.  Will discharge home with 3 days of Lasix.  Patient advised to follow-up with PCP for further evaluation and treatment as well as sleep study.  Return precautions discussed.  Patient understands and agrees with plan.  Patient vitals stable throughout ED course and discharged in satisfactory condition.  Patient also evaluated by Dr. Preston Fleeting who had the patient's management and agrees with plan.  Final Clinical Impressions(s) / ED Diagnoses   Final diagnoses:  Shortness of breath  Peripheral edema    ED Discharge Orders        Ordered    furosemide (LASIX) 40 MG tablet  Daily     12/30/16 0816       Emi Holes, PA-C 12/30/16 1609    Dione Booze, MD 12/30/16 2252

## 2016-12-30 NOTE — Discharge Instructions (Signed)
Medications: Lasix  Treatment: Take Lasix once daily for 3 days.   Follow-up: Please follow up with your doctor within 1 week for recheck and further evaluation and treatment.  Please return to the emergency department if you develop any new or worsening symptoms.

## 2017-01-05 MED FILL — ?ATORVASTATIN 10 MG TABLET: 10 | 30 days supply | Qty: 30 | Fill #2

## 2017-01-05 MED FILL — AMLODIPINE BESYLATE 10 MG T: 10 | 30 days supply | Qty: 30 | Fill #2

## 2017-01-05 MED FILL — ?METFORMIN HCL 500MG TABLET: 500 | 30 days supply | Qty: 30 | Fill #2

## 2017-01-27 ENCOUNTER — Emergency Department (HOSPITAL_COMMUNITY)
Admission: EM | Admit: 2017-01-27 | Discharge: 2017-01-27 | Disposition: A | Discharge status: Home / Self Care | Payer: Self-pay | Attending: Emergency Medicine | Admitting: Emergency Medicine

## 2017-01-27 ENCOUNTER — Emergency Department (HOSPITAL_COMMUNITY): Payer: Self-pay

## 2017-01-27 ENCOUNTER — Encounter (HOSPITAL_COMMUNITY): Payer: Self-pay | Admitting: *Deleted

## 2017-01-27 DIAGNOSIS — R0602 Shortness of breath: Secondary | ICD-10-CM | POA: Insufficient documentation

## 2017-01-27 DIAGNOSIS — R0789 Other chest pain: Secondary | ICD-10-CM | POA: Insufficient documentation

## 2017-01-27 DIAGNOSIS — Z79899 Other long term (current) drug therapy: Secondary | ICD-10-CM | POA: Insufficient documentation

## 2017-01-27 DIAGNOSIS — I10 Essential (primary) hypertension: Secondary | ICD-10-CM | POA: Insufficient documentation

## 2017-01-27 LAB — BASIC METABOLIC PANEL
Anion gap: 9 (ref 5–15)
BUN: 14 mg/dL (ref 6–20)
CALCIUM: 9.2 mg/dL (ref 8.9–10.3)
CO2: 22 mmol/L (ref 22–32)
CREATININE: 1.48 mg/dL — AB (ref 0.61–1.24)
Chloride: 104 mmol/L (ref 101–111)
GFR calc Af Amer: 58 mL/min — ABNORMAL LOW (ref >60)
GFR, EST NON AFRICAN AMERICAN: 50 mL/min — AB (ref >60)
Glucose, Bld: 125 mg/dL — ABNORMAL HIGH (ref 65–99)
Potassium: 4 mmol/L (ref 3.5–5.1)
Sodium: 135 mmol/L (ref 135–145)

## 2017-01-27 LAB — CBC
HCT: 40.2 % (ref 39.0–52.0)
Hemoglobin: 13.5 g/dL (ref 13.0–17.0)
MCH: 29.5 pg (ref 26.0–34.0)
MCHC: 33.6 g/dL (ref 30.0–36.0)
MCV: 87.8 fL (ref 78.0–100.0)
PLATELETS: 236 10*3/uL (ref 150–400)
RBC: 4.58 MIL/uL (ref 4.22–5.81)
RDW: 13.3 % (ref 11.5–15.5)
WBC: 4.7 10*3/uL (ref 4.0–10.5)

## 2017-01-27 LAB — I-STAT TROPONIN, ED
TROPONIN I, POC: 0 ng/mL (ref 0.00–0.08)
Troponin i, poc: 0 ng/mL (ref 0.00–0.08)

## 2017-01-27 LAB — BRAIN NATRIURETIC PEPTIDE: B Natriuretic Peptide: 9.5 pg/mL (ref 0.0–100.0)

## 2017-01-27 MED ORDER — ALBUTEROL SULFATE HFA 108 (90 BASE) MCG/ACT IN AERS
1.0000 | INHALATION_SPRAY | Freq: Once | RESPIRATORY_TRACT | Status: AC
Start: 2017-01-27 — End: 2017-01-27
  Administered 2017-01-27: 2 via RESPIRATORY_TRACT
  Filled 2017-01-27: qty 6.7

## 2017-01-27 MED ORDER — IPRATROPIUM-ALBUTEROL 0.5-2.5 (3) MG/3ML IN SOLN
3.0000 mL | Freq: Once | RESPIRATORY_TRACT | Status: AC
  Administered 2017-01-27: 3 mL via RESPIRATORY_TRACT
  Filled 2017-01-27: qty 3

## 2017-01-27 NOTE — ED Provider Notes (Signed)
[facility]ENC[facility] Provider Note   CSN: [id number] Arrival date & time: 01/27/17  1305     History   Chief Complaint Chief Complaint  Patient presents with  . Chest Pain    HPI Lonnie Brown is a 57 y.o. male with a past medical history of hypertension, hyperlipidemia who presents the emergency department today for shortness of breath.   He states that at approximately 12am this morning he awoke with chest pressure and shortness of breath.  He notes that he has been seen similar for this in the past.  He denies any chest pain but describes this as a chest pressure without any radiation to his back, neck, jaw or either shoulder.  His symptoms were relieved when he sat up and was able to catch his breath. The pain is non-exertional. No associated nausea. This is not exertional. He notes that he also had episode of diaphoresis today that was not related to chest pain/presure or shortness of breath. He does note that he has some minimal lower extremity edema bilaterally.  He does not watch his diet and commonly eats high salt foods. He denies risk factors for DVT/PE including exogenous testosterone use, recent surgery or travel, trauma, immobilization, smoking, previous blood clot, cough, hemoptysis, cancer, lower extremity pain, or family history of bleeding/clotting disorder.  Patient has been seen in the past for the same. Patient was given lasix in the past for his symptoms but never took it. There was question in the past if he had sleep apnea. He has never had a sleep study done.  Patient is a never smoker.  No family history of heart disease. Patient has never had any echocardiograms, heart catheterizations or stress test.  HPI  Past Medical History:  Diagnosis Date  . Hypertension     Patient Active Problem List   Diagnosis Date Noted  . Hyperlipidemia 09/25/2016  . Obesity 07/20/2015  . Achilles tendinitis 04/09/2015  . Hypertension 03/04/2015    . Muscle spasm of back 03/04/2015    Past Surgical History:  Procedure Laterality Date  . EYE SURGERY    . TONSILLECTOMY         Home Medications    Prior to Admission medications   Medication Sig Start Date End Date Taking? Authorizing Provider  amLODipine (NORVASC) 10 MG tablet Take 1 tablet (10 mg total) by mouth daily. 09/25/16   Marcine MatarJohnson, Deborah B, MD  amoxicillin-clavulanate (AUGMENTIN) 875-125 MG tablet Take 1 tablet by mouth 2 (two) times daily. Patient not taking: Reported on 07/14/2016 06/14/16   Gilda CreasePollina, Christopher J, MD  atorvastatin (LIPITOR) 10 MG tablet Take 1 tablet (10 mg total) by mouth daily. 09/25/16   Marcine MatarJohnson, Deborah B, MD  furosemide (LASIX) 40 MG tablet Take 1 tablet (40 mg total) by mouth daily for 3 days. 12/30/16 01/02/17  Emi HolesLaw, Alexandra M, PA-C  metFORMIN (GLUCOPHAGE) 500 MG tablet Take 1 tablet (500 mg total) by mouth daily with breakfast. 09/25/16   Marcine MatarJohnson, Deborah B, MD  methocarbamol (ROBAXIN) 500 MG tablet Take 1 tablet (500 mg total) by mouth 2 (two) times daily as needed for muscle spasms. 09/25/16   Marcine MatarJohnson, Deborah B, MD    Family History Family History  Problem Relation Age of Onset  . Diabetes Mother   . Hypertension Mother     Social History Social History   Tobacco Use  . Smoking status: Never Smoker  . Smokeless tobacco: Never Used  Substance Use Topics  . Alcohol use:  No  . Drug use: No     Allergies   Lisinopril and Banana   Review of Systems Review of Systems  All other systems reviewed and are negative.    Physical Exam Updated Vital Signs BP (!) 153/99   Pulse 76   Temp 98.6 F (37 C) (Oral)   Resp (!) 26   SpO2 98%   Physical Exam  Constitutional: He appears well-developed and well-nourished.  HENT:  Head: Normocephalic and atraumatic.  Right Ear: External ear normal.  Left Ear: External ear normal.  Nose: Nose normal.  Mouth/Throat: Uvula is midline, oropharynx is clear and moist and mucous membranes  are normal. No tonsillar exudate.  Eyes: Pupils are equal, round, and reactive to light. Right eye exhibits no discharge. Left eye exhibits no discharge. No scleral icterus.  Neck: Trachea normal. Neck supple. No JVD present. No spinous process tenderness present. Carotid bruit is not present. No neck rigidity. Normal range of motion present.  Cardiovascular: Normal rate, regular rhythm and intact distal pulses.  No murmur heard. Pulses:      Radial pulses are 2+ on the right side, and 2+ on the left side.       Dorsalis pedis pulses are 2+ on the right side, and 2+ on the left side.       Posterior tibial pulses are 2+ on the right side, and 2+ on the left side.  Trace lower extremity swelling bilaterally. No TTP.   Pulmonary/Chest: Effort normal. No tachypnea. No respiratory distress. He has no decreased breath sounds. He has wheezes (expiratory wheeze left). He has no rhonchi. He has no rales. He exhibits tenderness.  No increased work of breathing. No accessory muscle use. Patient is sitting upright, speaking in full sentences without difficulty   Abdominal: Soft. Bowel sounds are normal. There is no tenderness. There is no rebound and no guarding.  Musculoskeletal: He exhibits no edema.  Lymphadenopathy:    He has no cervical adenopathy.  Neurological: He is alert.  Skin: Skin is warm and dry. No rash noted. He is not diaphoretic.  Psychiatric: He has a normal mood and affect.  Nursing note and vitals reviewed.    ED Treatments / Results  Labs (all labs ordered are listed, but only abnormal results are displayed) Labs Reviewed  BASIC METABOLIC PANEL - Abnormal; Notable for the following components:      Result Value   Glucose, Bld 125 (*)    Creatinine, Ser 1.48 (*)    GFR calc non Af Amer 50 (*)    GFR calc Af Amer 58 (*)    All other components within normal limits  CBC  BRAIN NATRIURETIC PEPTIDE  I-STAT TROPONIN, ED  I-STAT TROPONIN, ED    EKG  EKG  Interpretation  Date/Time:  Saturday January 27 2017 13:11:36 EST Ventricular Rate:  89 PR Interval:  154 QRS Duration: 110 QT Interval:  360 QTC Calculation: 438 R Axis:   -15 Text Interpretation:  Normal sinus rhythm Inferior infarct , age undetermined Anterior infarct , age undetermined Abnormal ECG No significant change since last tracing Confirmed by Margarita Grizzle [phone]) on 01/27/2017 8:08:36 PM       Radiology Dg Chest 2 View  Result Date: 01/27/2017 CLINICAL DATA:  Chest pain and diaphoresis EXAM: CHEST  2 VIEW COMPARISON:  December 30, 2016 FINDINGS: There is a questionable nodular lesion in the left base medially measuring 1.5 x 1.3 cm seen only on the frontal view. There is no edema  or consolidation. Heart size and pulmonary vascularity are normal. No adenopathy. No evident bone lesions. IMPRESSION: Questionable nodular lesion left base medially seen only on the frontal view. Advise noncontrast enhanced chest CT to further assess. No edema or consolidation.  Heart size normal. Electronically Signed   By: Bretta Bang III M.D.   On: 01/27/2017 14:07    Procedures Procedures (including critical care time)  Medications Ordered in ED Medications  ipratropium-albuterol (DUONEB) 0.5-2.5 (3) MG/3ML nebulizer solution 3 mL (3 mLs Nebulization Given 01/27/17 1841)  albuterol (PROVENTIL HFA;VENTOLIN HFA) 108 (90 Base) MCG/ACT inhaler 1-2 puff (2 puffs Inhalation Given 01/27/17 2126)     Initial Impression / Assessment and Plan / ED Course  I have reviewed the triage vital signs and the nursing notes.  Pertinent labs & imaging results that were available during my care of the patient were reviewed by me and considered in my medical decision making (see chart for details).      57 y.o. male past medical history of hypertension, hyperlipidemia who presents the emergency department today for shortness of breath.  He states that at approximately 12am this morning he awoke with  chest pressure and shortness of breath.  He notes that he has been seen similar for this in the past.  He denies any chest pain but describes this as a chest pressure without any radiation to his back, neck, jaw or either shoulder.  His symptoms were relieved when he sat up and was able to catch his breath. The pain is non-exertional. No associated nausea. This is not exertional. He notes that he also had episode of diaphoresis today that was not related to chest pain/presure or shortness of breath. He does note that he has some minimal lower extremity edema bilaterally.  He does not watch his diet. Denies risk factors for DVT/PE.  On exam the patient is without any fever, tachycardia or tachypnea.  He is satting at 96% on room air.  He does have some chest tenderness palpation.  He notes noted to have some wheezing that was improved after DuoNeb treatment. No recent URI symptoms. Will give albuterol inhaler for home.   Patient presented with chest pain to the ED. Patient is to be discharged with recommendation to follow up with PCP in regards to today's hospital visit. Chest pain is not likely of cardiac or pulmonary etiology due to presentation, no risk factors for PE, stable vital signs, no tracheal deviation, no JVD or new murmur, RRR, breath sounds now equal bilaterally, EKG without acute abnormalities, negative troponin x 2 and normal BNP. Patient did have nodular appearance on chest xray that he will need to follow up with non-emergent chest ct on outpatient basis. HEART score is 3. Advised patient to take PPI. He is to follow up with PCP for further outpatient testing and to discuss sleep study. Patient has been advised to return to the ED if chest pain becomes exertional, associated with nausea, radiates to left jaw/arm, worsens or becomes concerning in any way. Patient appears reliable for follow up and is agreeable to discharge. I advised the patient to follow-up with their primary care provider this  week. I advised the patient to return to the emergency department with new or worsening symptoms or new concerns. The patient verbalized understanding and agreement with plan. Patient stable for discharge.   Final Clinical Impressions(s) / ED Diagnoses   Final diagnoses:  Atypical chest pain    ED Discharge Orders    None  Yahye, Siebert, PA-C 01/28/17 0024    Margarita Grizzle, MD 01/28/17 2226

## 2017-01-27 NOTE — ED Triage Notes (Signed)
To ED for eval of chest pressure that woke pt last night. Complains of diaphoresis as well. Went to work today and pressure returned. Currently no pain.

## 2017-01-27 NOTE — Discharge Instructions (Signed)
Read instructions below for reasons to return to the Emergency Department. It is recommended that your follow up with your Primary Care Doctor in regards to today's visit. If you do not have a doctor, use the resource guide listed below to help you find one. Begin taking over the counter Prilosec or Zegrid (omeprazole) as directed.   Please discuss with your PCP about a sleep study and further workup of your symptoms.   Tests performed today include: An EKG of your heart A chest x-ray - a nodule was found on your xray that will need out patient CT follow up. Please discuss with your PCP.  Cardiac enzymes - a blood test for heart muscle damage Blood counts and electrolytes Vital signs. See below for your results today.   Chest Pain (Nonspecific)  HOME CARE INSTRUCTIONS  For the next few days, avoid physical activities that bring on chest pain. Continue physical activities as directed.  Do not smoke cigarettes or drink alcohol until your symptoms are gone. If you do smoke, it is time to quit. You may receive instructions and counseling on how to stop smoking. Only take over-the-counter or prescription medicine for pain, discomfort, or fever as directed by your caregiver.  Follow your caregiver's suggestions for further testing if your chest pain does not go away.  Keep any follow-up appointments you made. If you do not go to an appointment, you could develop lasting (chronic) problems with pain. If there is any problem keeping an appointment, you must call to reschedule.  SEEK MEDICAL CARE IF:  You think you are having problems from the medicine you are taking. Read your medicine instructions carefully.  Your chest pain does not go away, even after treatment.  You develop a rash with blisters on your chest.  SEEK IMMEDIATE MEDICAL CARE IF:  You have increased chest pain or pain that spreads to your arm, neck, jaw, back, or belly (abdomen).  You develop shortness of breath, an increasing cough,  or you are coughing up blood.  You have severe back or abdominal pain, feel sick to your stomach (nauseous) or throw up (vomit).  You develop severe weakness, fainting, or chills.  You have an oral temperature above 102 F (38.9 C), not controlled by medicine.  THIS IS AN EMERGENCY. Do not wait to see if the pain will go away. Get medical help at once. Call your local emergency services (911 in [place] Do not drive yourself to the hospital. Additional Information:  Your vital signs today were: BP (!) 149/93    Pulse 89    Temp 98.6 F (37 C) (Oral)    Resp (!) 28    SpO2 96%  If your blood pressure (BP) was elevated above 135/85 this visit, please have this repeated by your doctor within one month. ---------------

## 2017-02-12 MED FILL — ?ATORVASTATIN 10 MG TABLET: 10 | 30 days supply | Qty: 30 | Fill #3

## 2017-02-12 MED FILL — ?METFORMIN HCL 500MG TABLET: 500 | 30 days supply | Qty: 30 | Fill #3

## 2017-02-12 MED FILL — AMLODIPINE BESYLATE 10 MG T: 10 | 30 days supply | Qty: 30 | Fill #3

## 2017-03-20 ENCOUNTER — Encounter (HOSPITAL_COMMUNITY): Payer: Self-pay | Admitting: Emergency Medicine

## 2017-03-20 ENCOUNTER — Emergency Department (HOSPITAL_COMMUNITY)
Admission: EM | Admit: 2017-03-20 | Discharge: 2017-03-20 | Disposition: A | Discharge status: Home / Self Care | Payer: No Typology Code available for payment source | Attending: Emergency Medicine | Admitting: Emergency Medicine

## 2017-03-20 DIAGNOSIS — Z7984 Long term (current) use of oral hypoglycemic drugs: Secondary | ICD-10-CM | POA: Insufficient documentation

## 2017-03-20 DIAGNOSIS — Z79899 Other long term (current) drug therapy: Secondary | ICD-10-CM | POA: Diagnosis not present

## 2017-03-20 DIAGNOSIS — Y939 Activity, unspecified: Secondary | ICD-10-CM | POA: Insufficient documentation

## 2017-03-20 DIAGNOSIS — E785 Hyperlipidemia, unspecified: Secondary | ICD-10-CM | POA: Insufficient documentation

## 2017-03-20 DIAGNOSIS — S61412A Laceration without foreign body of left hand, initial encounter: Secondary | ICD-10-CM | POA: Diagnosis not present

## 2017-03-20 DIAGNOSIS — I10 Essential (primary) hypertension: Secondary | ICD-10-CM | POA: Diagnosis not present

## 2017-03-20 DIAGNOSIS — Y998 Other external cause status: Secondary | ICD-10-CM | POA: Insufficient documentation

## 2017-03-20 DIAGNOSIS — Y92410 Unspecified street and highway as the place of occurrence of the external cause: Secondary | ICD-10-CM | POA: Insufficient documentation

## 2017-03-20 MED ORDER — CYCLOBENZAPRINE HCL 10 MG PO TABS: 10.0000 mg | ORAL_TABLET | Freq: Two times a day (BID) | ORAL | 0 refills | Status: DC | PRN

## 2017-03-20 MED ORDER — IBUPROFEN 800 MG PO TABS: 800.0000 mg | ORAL_TABLET | Freq: Three times a day (TID) | ORAL | 0 refills | Status: DC
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
## 2017-03-20 NOTE — Discharge Instructions (Signed)
You can expect to be sore for the next several days.  You will likely be more sore tomorrow and the next day, and then your symptoms should start to improve.  Please take medications as directed for muscle soreness and aches.  Feel free to apply ice or heat to sore muscles.    Return for chest pain, SOB, numbness, weakness, or any other symptoms you find concerning.

## 2017-03-20 NOTE — ED Notes (Signed)
Pt verbalizes understanding of d/c instructions. Pt received prescriptions. Pt ambulatory at d/c with all belongings.  

## 2017-03-20 NOTE — ED Notes (Signed)
ED Provider at bedside. 
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
## 2017-03-27 MED FILL — AMLODIPINE BESYLATE 10 MG T: 10 | 30 days supply | Qty: 30 | Fill #4

## 2017-03-27 MED FILL — ATORVASTATIN 10 MG TABLET: 10 | 30 days supply | Qty: 30 | Fill #4

## 2017-03-27 MED FILL — metFORMIN HCL 500 MG TABS: 500 | 30 days supply | Qty: 30 | Fill #4

## 2017-04-30 MED FILL — metFORMIN HCL 500 MG TABS: 500 | 30 days supply | Qty: 30 | Fill #5

## 2017-04-30 MED FILL — AMLODIPINE BESYLATE 10 MG T: 10 | 30 days supply | Qty: 30 | Fill #5

## 2017-04-30 MED FILL — ATORVASTATIN 10 MG TABLET: 10 | 30 days supply | Qty: 30 | Fill #5

## 2017-06-22 MED FILL — AMLODIPINE BESYLATE 10 MG T: 10 | 30 days supply | Qty: 30 | Fill #6

## 2017-06-22 MED FILL — metFORMIN HCL 500 MG TABS: 500 | 30 days supply | Qty: 30 | Fill #6

## 2017-06-22 MED FILL — ATORVASTATIN 10 MG TABLET: 10 | 30 days supply | Qty: 30 | Fill #6

## 2017-07-06 ENCOUNTER — Ambulatory Visit: Payer: Self-pay

## 2017-07-23 ENCOUNTER — Encounter: Payer: Self-pay | Admitting: Internal Medicine

## 2017-08-13 ENCOUNTER — Emergency Department (HOSPITAL_COMMUNITY)
Admission: EM | Admit: 2017-08-13 | Discharge: 2017-08-13 | Disposition: A | Discharge status: Home / Self Care | Payer: Self-pay | Attending: Emergency Medicine | Admitting: Emergency Medicine

## 2017-08-13 ENCOUNTER — Other Ambulatory Visit: Payer: Self-pay

## 2017-08-13 DIAGNOSIS — Z7984 Long term (current) use of oral hypoglycemic drugs: Secondary | ICD-10-CM | POA: Insufficient documentation

## 2017-08-13 DIAGNOSIS — Y939 Activity, unspecified: Secondary | ICD-10-CM | POA: Insufficient documentation

## 2017-08-13 DIAGNOSIS — S20461A Insect bite (nonvenomous) of right back wall of thorax, initial encounter: Secondary | ICD-10-CM | POA: Insufficient documentation

## 2017-08-13 DIAGNOSIS — W57XXXA Bitten or stung by nonvenomous insect and other nonvenomous arthropods, initial encounter: Secondary | ICD-10-CM | POA: Insufficient documentation

## 2017-08-13 DIAGNOSIS — Z79899 Other long term (current) drug therapy: Secondary | ICD-10-CM | POA: Insufficient documentation

## 2017-08-13 DIAGNOSIS — Y929 Unspecified place or not applicable: Secondary | ICD-10-CM | POA: Insufficient documentation

## 2017-08-13 DIAGNOSIS — I10 Essential (primary) hypertension: Secondary | ICD-10-CM | POA: Insufficient documentation

## 2017-08-13 DIAGNOSIS — Y999 Unspecified external cause status: Secondary | ICD-10-CM | POA: Insufficient documentation

## 2017-08-13 MED ORDER — DOXYCYCLINE HYCLATE 100 MG PO TABS
100.0000 mg | ORAL_TABLET | Freq: Once | ORAL | Status: AC
  Administered 2017-08-13: 100 mg via ORAL
  Filled 2017-08-13: qty 1

## 2017-08-13 MED ORDER — DOXYCYCLINE HYCLATE 100 MG PO CAPS: 100.0000 mg | ORAL_CAPSULE | Freq: Two times a day (BID) | ORAL | 0 refills | Status: DC

## 2017-08-13 NOTE — ED Provider Notes (Signed)
[facility] EMERGENCY DEPARTMENT Provider Note   CSN: [id number] Arrival date & time: 08/13/17  2127     History   Chief Complaint Chief Complaint  Patient presents with  . Tick Removal    HPI Lonnie Brown is a 57 y.o. male.  57 year old male presents for tick removal from right back area patient is unsure how long the tick has been there however there is a large area of redness and swelling around the tick.  No other complaints or concerns.     Past Medical History:  Diagnosis Date  . Hypertension     Patient Active Problem List   Diagnosis Date Noted  . Hyperlipidemia 09/25/2016  . Obesity 07/20/2015  . Achilles tendinitis 04/09/2015  . Hypertension 03/04/2015  . Muscle spasm of back 03/04/2015    Past Surgical History:  Procedure Laterality Date  . EYE SURGERY    . TONSILLECTOMY          Home Medications    Prior to Admission medications   Medication Sig Start Date End Date Taking? Authorizing Provider  amLODipine (NORVASC) 10 MG tablet Take 1 tablet (10 mg total) by mouth daily. 09/25/16   Marcine Matar, MD  atorvastatin (LIPITOR) 10 MG tablet Take 1 tablet (10 mg total) by mouth daily. 09/25/16   Marcine Matar, MD  cyclobenzaprine (FLEXERIL) 10 MG tablet Take 1 tablet (10 mg total) by mouth 2 (two) times daily as needed for muscle spasms. 03/20/17   Roxy Horseman, PA-C  doxycycline (VIBRAMYCIN) 100 MG capsule Take 1 capsule (100 mg total) by mouth 2 (two) times daily. 08/13/17   Jeannie Fend, PA-C  furosemide (LASIX) 40 MG tablet Take 1 tablet (40 mg total) by mouth daily for 3 days. 12/30/16 01/02/17  Emi Holes, PA-C  ibuprofen (ADVIL,MOTRIN) 800 MG tablet Take 1 tablet (800 mg total) by mouth 3 (three) times daily. 03/20/17   Roxy Horseman, PA-C  metFORMIN (GLUCOPHAGE) 500 MG tablet Take 1 tablet (500 mg total) by mouth daily with breakfast. 09/25/16   Marcine Matar, MD  methocarbamol (ROBAXIN) 500 MG tablet  Take 1 tablet (500 mg total) by mouth 2 (two) times daily as needed for muscle spasms. 09/25/16   Marcine Matar, MD    Family History Family History  Problem Relation Age of Onset  . Diabetes Mother   . Hypertension Mother     Social History Social History   Tobacco Use  . Smoking status: Never Smoker  . Smokeless tobacco: Never Used  Substance Use Topics  . Alcohol use: No  . Drug use: No     Allergies   Lisinopril and Banana   Review of Systems Review of Systems  Constitutional: Negative for chills and fever.  Musculoskeletal: Negative for arthralgias and myalgias.  Skin: Positive for color change and rash.  Allergic/Immunologic: Positive for immunocompromised state.  Neurological: Negative for weakness, numbness and headaches.  Hematological: Does not bruise/bleed easily.  Psychiatric/Behavioral: Negative for confusion.  All other systems reviewed and are negative.    Physical Exam Updated Vital Signs BP (!) 156/100 (BP Location: Right Arm)   Pulse 81   Temp 98 F (36.7 C) (Oral)   Resp 17   Ht 5\' 11"  (1.803 m)   Wt 127 kg (280 lb)   SpO2 98%   BMI 39.05 kg/m   Physical Exam  Constitutional: He is oriented to person, place, and time. He appears well-developed and well-nourished. No distress.  HENT:  Head: Normocephalic and atraumatic.  Cardiovascular: Intact distal pulses.  Pulmonary/Chest: Effort normal.  Musculoskeletal: He exhibits no tenderness.  Neurological: He is alert and oriented to person, place, and time.  Skin: Skin is warm and dry. He is not diaphoretic. There is erythema.     Psychiatric: He has a normal mood and affect. His behavior is normal.  Nursing note and vitals reviewed.    ED Treatments / Results  Labs (all labs ordered are listed, but only abnormal results are displayed) Labs Reviewed - No data to display  EKG None  Radiology No results found.  Procedures Procedures (including critical care  time)  Medications Ordered in ED Medications  doxycycline (VIBRA-TABS) tablet 100 mg (has no administration in time range)     Initial Impression / Assessment and Plan / ED Course  I have reviewed the triage vital signs and the nursing notes.  Pertinent labs & imaging results that were available during my care of the patient were reviewed by me and considered in my medical decision making (see chart for details).  Clinical Course as of Aug 13 2248  Mon Aug 13, 2017  [id number] 57 year old male presents for tick removal and rash.  Patient has a tick attached to his right mid back with area of erythema and induration surrounding, no central clearance.  Tick was removed intact without difficulty.  Patient will be given first dose of doxycycline here in the ER with prescription for same for coverage for the existing cellulitis and potential tickborne illness.   [LM]    Clinical Course User Index [LM] Jeannie FendMurphy, Jadrian Bulman A, PA-C    Final Clinical Impressions(s) / ED Diagnoses   Final diagnoses:  Tick bite with subsequent removal of tick    ED Discharge Orders        Ordered    doxycycline (VIBRAMYCIN) 100 MG capsule  2 times daily     08/13/17 2246       Jeannie FendMurphy, Huie Ghuman A, PA-C 08/13/17 2250    Little, Ambrose Finlandachel Morgan, MD 08/14/17 [phone]

## 2017-08-13 NOTE — ED Notes (Signed)
See EDP assessment / note.   

## 2017-08-13 NOTE — Discharge Instructions (Addendum)
Take doxycycline as prescribed and complete the full course. Warm compresses to the area for 20 minutes at a time.

## 2017-08-13 NOTE — ED Notes (Signed)
Pt verbalized understanding discharge instructions and denies any further needs or questions at this time. VS stable, ambulatory and steady gait.   

## 2017-08-13 NOTE — ED Triage Notes (Signed)
Patient has tick to middle, right side of back. Wound is swollen, and red.

## 2017-08-14 MED FILL — DOXYCYCLINE HYCLATE 100 MG: 100 | 10 days supply | Qty: 20 | Fill #0

## 2017-08-27 ENCOUNTER — Ambulatory Visit: Payer: Self-pay | Attending: Internal Medicine | Admitting: Internal Medicine

## 2017-08-27 ENCOUNTER — Encounter: Payer: Self-pay | Admitting: Internal Medicine

## 2017-08-27 VITALS — BP 149/103 | HR 82 | Temp 98.3°F | Resp 16 | Ht 71.0 in | Wt 306.2 lb

## 2017-08-27 DIAGNOSIS — H6123 Impacted cerumen, bilateral: Secondary | ICD-10-CM

## 2017-08-27 DIAGNOSIS — Z6841 Body Mass Index (BMI) 40.0 and over, adult: Secondary | ICD-10-CM

## 2017-08-27 DIAGNOSIS — Z125 Encounter for screening for malignant neoplasm of prostate: Secondary | ICD-10-CM

## 2017-08-27 DIAGNOSIS — Z0000 Encounter for general adult medical examination without abnormal findings: Secondary | ICD-10-CM

## 2017-08-27 DIAGNOSIS — Z23 Encounter for immunization: Secondary | ICD-10-CM

## 2017-08-27 DIAGNOSIS — E669 Obesity, unspecified: Secondary | ICD-10-CM | POA: Insufficient documentation

## 2017-08-27 DIAGNOSIS — R7303 Prediabetes: Secondary | ICD-10-CM

## 2017-08-27 DIAGNOSIS — Z888 Allergy status to other drugs, medicaments and biological substances status: Secondary | ICD-10-CM | POA: Insufficient documentation

## 2017-08-27 DIAGNOSIS — Z1211 Encounter for screening for malignant neoplasm of colon: Secondary | ICD-10-CM

## 2017-08-27 DIAGNOSIS — I129 Hypertensive chronic kidney disease with stage 1 through stage 4 chronic kidney disease, or unspecified chronic kidney disease: Secondary | ICD-10-CM | POA: Insufficient documentation

## 2017-08-27 DIAGNOSIS — E785 Hyperlipidemia, unspecified: Secondary | ICD-10-CM

## 2017-08-27 DIAGNOSIS — N183 Chronic kidney disease, stage 3 unspecified: Secondary | ICD-10-CM

## 2017-08-27 DIAGNOSIS — I10 Essential (primary) hypertension: Secondary | ICD-10-CM

## 2017-08-27 LAB — POCT GLYCOSYLATED HEMOGLOBIN (HGB A1C): HbA1c, POC (controlled diabetic range): 6.4 % (ref 0.0–7.0)

## 2017-08-27 LAB — GLUCOSE, POCT (MANUAL RESULT ENTRY): POC Glucose: 131 mg/dl — AB (ref 70–99)

## 2017-08-27 MED ORDER — AMLODIPINE BESYLATE 10 MG PO TABS: 10.0000 mg | ORAL_TABLET | Freq: Every day | ORAL | 6 refills | Status: DC

## 2017-08-27 MED ORDER — ATORVASTATIN CALCIUM 10 MG PO TABS: 10.0000 mg | ORAL_TABLET | Freq: Every day | ORAL | 3 refills | Status: DC

## 2017-08-27 NOTE — Patient Instructions (Addendum)
Give appointment in 2 weeks with clinical pharmacist for repeat BP check.   Follow a Healthy Eating Plan - You can do it! Limit sugary drinks.  Avoid sodas, sweet tea, sport or energy drinks, or fruit drinks.  Drink water, lo-fat milk, or diet drinks. Limit snack foods.   Cut back on candy, cake, cookies, chips, ice cream.  These are a special treat, only in small amounts. Eat plenty of vegetables.  Especially dark green, red, and orange vegetables. Aim for at least 3 servings a day. More is better! Include fruit in your daily diet.  Whole fruit is much healthier than fruit juice! Limit "white" bread, "white" pasta, "white" rice.   Choose "100% whole grain" products, brown or wild rice. Avoid fatty meats. Try "Meatless Monday" and choose eggs or beans one day a week.  When eating meat, choose lean meats like chicken, [place], and fish.  Grill, broil, or bake meats instead of frying, and eat poultry without the skin. Eat less salt.  Avoid frozen pizzas, frozen dinners and salty foods.  Use seasonings other than salt in cooking.  This can help blood pressure and keep you from swelling Beer, wine and liquor have calories.  If you can safely drink alcohol, limit to 1 drink per day for women, 2 drinks for men  Influenza Virus Vaccine injection (Fluarix) What is this medicine? INFLUENZA VIRUS VACCINE (in floo EN zuh VAHY ruhs vak SEEN) helps to reduce the risk of getting influenza also known as the flu. This medicine may be used for other purposes; ask your health care provider or pharmacist if you have questions. COMMON BRAND NAME(S): Fluarix, Fluzone What should I tell my health care provider before I take this medicine? They need to know if you have any of these conditions: -bleeding disorder like hemophilia -fever or infection -Guillain-Barre syndrome or other neurological problems -immune system problems -infection with the human immunodeficiency virus (HIV) or AIDS -low blood platelet  counts -multiple sclerosis -an unusual or allergic reaction to influenza virus vaccine, eggs, chicken proteins, latex, gentamicin, other medicines, foods, dyes or preservatives -pregnant or trying to get pregnant -breast-feeding How should I use this medicine? This vaccine is for injection into a muscle. It is given by a health care professional. A copy of Vaccine Information Statements will be given before each vaccination. Read this sheet carefully each time. The sheet may change frequently. Talk to your pediatrician regarding the use of this medicine in children. Special care may be needed. Overdosage: If you think you have taken too much of this medicine contact a poison control center or emergency room at once. NOTE: This medicine is only for you. Do not share this medicine with others. What if I miss a dose? This does not apply. What may interact with this medicine? -chemotherapy or radiation therapy -medicines that lower your immune system like etanercept, anakinra, infliximab, and adalimumab -medicines that treat or prevent blood clots like warfarin -phenytoin -steroid medicines like prednisone or cortisone -theophylline -vaccines This list may not describe all possible interactions. Give your health care provider a list of all the medicines, herbs, non-prescription drugs, or dietary supplements you use. Also tell them if you smoke, drink alcohol, or use illegal drugs. Some items may interact with your medicine. What should I watch for while using this medicine? Report any side effects that do not go away within 3 days to your doctor or health care professional. Call your health care provider if any unusual symptoms occur within 6  weeks of receiving this vaccine. You may still catch the flu, but the illness is not usually as bad. You cannot get the flu from the vaccine. The vaccine will not protect against colds or other illnesses that may cause fever. The vaccine is needed every  year. What side effects may I notice from receiving this medicine? Side effects that you should report to your doctor or health care professional as soon as possible: -allergic reactions like skin rash, itching or hives, swelling of the face, lips, or tongue Side effects that usually do not require medical attention (report to your doctor or health care professional if they continue or are bothersome): -fever -headache -muscle aches and pains -pain, tenderness, redness, or swelling at site where injected -weak or tired This list may not describe all possible side effects. Call your doctor for medical advice about side effects. You may report side effects to FDA at 1-800-FDA-1088. Where should I keep my medicine? This vaccine is only given in a clinic, pharmacy, doctor's office, or other health care setting and will not be stored at home. NOTE: This sheet is a summary. It may not cover all possible information. If you have questions about this medicine, talk to your doctor, pharmacist, or health care provider.  2018 Elsevier/Gold Standard (2007-07-24 09:30:40)

## 2017-08-27 NOTE — Progress Notes (Signed)
Patient ID: Lonnie PoundsMichael Woodfield, male    DOB: 24-Jun-1960  MRN: [id number]  CC: Annual Exam   Subjective: Lonnie Brown is a 57 y.o. male who presents for annual exam.  Patient was last seen in the fall of last year. His concerns today include:  History of HTN, CKD stage 3, obesity, HL, pre-DM  HM: due for colon CA screen, Flu shot.  HTN:  Forgot to take BP med this a.m.  No device to check BP Limits salt in foods No CP/SOB/HA/dizziness/ blurred vision  Obesity/Pre-DM: Gained 12 lbs over past 1 yr  Feels he is eating healthier but too much. Drinks 1-2 energy drinks/day, juices and sweet tea. Also drinks Red Bull several times a week.  He does not drink much water.  Eats a lot of seafood especially shrimp. Does eat red meat but not very often. Loves potatoes.  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  However BNP was normal and chest x-ray revealed no pulmonary edema.  Patient was advised to have a stress test.  However, today he reports that he does snore but wakes up every morning feeling refreshed.  He denies any daytime sleepiness.  Patient Active Problem List   Diagnosis Date Noted  . Hyperlipidemia 09/25/2016  . Obesity 07/20/2015  . Achilles tendinitis 04/09/2015  . Hypertension 03/04/2015  . Muscle spasm of back 03/04/2015     Current Outpatient Medications on File Prior to Visit  Medication Sig Dispense Refill  . cyclobenzaprine (FLEXERIL) 10 MG tablet Take 1 tablet (10 mg total) by mouth 2 (two) times daily as needed for muscle spasms. 20 tablet 0   No current facility-administered medications on file prior to visit.     Allergies  Allergen Reactions  . Lisinopril Other (See Comments)    Angioedema  . Banana Nausea And Vomiting    Social History   Socioeconomic History  . Marital status: Married    Spouse name: Not on file  . Number of children: Not on file    . Years of education: Not on file  . Highest education level: Not on file  Occupational History  . Not on file  Social Needs  . Financial resource strain: Not on file  . Food insecurity:    Worry: Not on file    Inability: Not on file  . Transportation needs:    Medical: Not on file    Non-medical: Not on file  Tobacco Use  . Smoking status: Never Smoker  . Smokeless tobacco: Never Used  Substance and Sexual Activity  . Alcohol use: No  . Drug use: No  . Sexual activity: Not on file  Lifestyle  . Physical activity:    Days per week: Not on file    Minutes per session: Not on file  . Stress: Not on file  Relationships  . Social connections:    Talks on phone: Not on file    Gets together: Not on file    Attends religious service: Not on file    Active member of club or organization: Not on file    Attends meetings of clubs or organizations: Not on file    Relationship status: Not on file  . Intimate partner violence:    Fear of current or ex partner: Not on file    Emotionally abused: Not on file    Physically abused: Not on file  Forced sexual activity: Not on file  Other Topics Concern  . Not on file  Social History Narrative  . Not on file    Family History  Problem Relation Age of Onset  . Diabetes Mother   . Hypertension Mother     Past Surgical History:  Procedure Laterality Date  . EYE SURGERY    . TONSILLECTOMY      ROS: Review of Systems  Constitutional: Negative for activity change, fatigue and unexpected weight change.  HENT: Negative for congestion, hearing loss, sore throat and trouble swallowing.   Eyes: Negative for visual disturbance.  Respiratory: Negative for chest tightness and shortness of breath.   Cardiovascular: Negative for chest pain, palpitations and leg swelling.  Gastrointestinal: Negative for abdominal pain, blood in stool, constipation and diarrhea.  Genitourinary: Negative for difficulty urinating and hematuria.   Musculoskeletal: Negative for arthralgias.  Neurological: Negative for dizziness.  Psychiatric/Behavioral: Negative for dysphoric mood. The patient is not nervous/anxious.     PHYSICAL EXAM: BP (!) 149/103 (BP Location: Left Arm, Cuff Size: Large)   Pulse 82   Temp 98.3 F (36.8 C) (Oral)   Resp 16   Ht 5\' 11"  (1.803 m)   Wt (!) 306 lb 3.2 oz (138.9 kg)   SpO2 98%   BMI 42.71 kg/m   Wt Readings from Last 3 Encounters:  08/27/17 (!) 306 lb 3.2 oz (138.9 kg)  08/13/17 280 lb (127 kg)  03/20/17 290 lb (131.5 kg)    Physical Exam  General appearance - alert, well appearing, and in no distress Mental status - normal mood, behavior, speech, dress, motor activity, and thought processes Eyes - pupils equal and reactive, extraocular eye movements intact Ears -moderate amount of impacted cerumen in both ears obscuring the ear canal  nose - normal and patent, no erythema, discharge or polyps Mouth - mucous membranes moist, pharynx normal without lesions Neck - supple, no significant adenopathy.  No thyroid enlargement or palpable nodules Lymphatics - no palpable lymphadenopathy, no hepatosplenomegaly Chest - clear to auscultation, no wheezes, rales or rhonchi, symmetric air entry Heart - normal rate, regular rhythm, normal S1, S2, no murmurs, rubs, clicks or gallops Abdomen -obese, normal bowel sounds, soft nontender.  No palpable masses organomegaly Musculoskeletal -no signs of active inflammation of the large joints Extremities -trace lower extremity edema  Results for orders placed or performed in visit on 08/27/17  POCT glucose (manual entry)  Result Value Ref Range   POC Glucose 131 (A) 70 - 99 mg/dl  POCT glycosylated hemoglobin (Hb A1C)  Result Value Ref Range   Hemoglobin A1C     HbA1c POC (<> result, manual entry)     HbA1c, POC (prediabetic range)     HbA1c, POC (controlled diabetic range) 6.4 0.0 - 7.0 %     ASSESSMENT AND PLAN: 1. Annual physical exam I spent  some time doing dietary counseling.  Advised to cut back on the energy drinks, stop drinking juices and sweet tea.  Encourage him to drink several glasses of water daily. -We discussed the portion sizes Advised to cut back on some of the white carbohydrates.  2. Essential hypertension Not at goal.  He will take the amlodipine when he returns home today.  Follow-up in a few weeks with clinical pharmacist for blood pressure recheck. - Microalbumin / creatinine urine ratio - CBC - Comprehensive metabolic panel - amLODipine (NORVASC) 10 MG tablet; Take 1 tablet (10 mg total) by mouth daily.  Dispense: 30 tablet; Refill:  6  3. Class 3 severe obesity due to excess calories without serious comorbidity with body mass index (BMI) of 40.0 to 44.9 in adult [facility]) See #1 above  4. Bilateral impacted cerumen My CMA was able to flush both ears successfully.  5. Need for immunization against influenza - Flu Vaccine QUAD 36+ mos IM  6. Colon cancer screening - Fecal occult blood, imunochemical([facility])  7. CKD (chronic kidney disease) stage 3, GFR 30-59 ml/min (HCC) Advised to avoid NSAIDs.  Good blood pressure control is very important.  8. Hyperlipidemia, unspecified hyperlipidemia type - Lipid panel - atorvastatin (LIPITOR) 10 MG tablet; Take 1 tablet (10 mg total) by mouth daily.  Dispense: 90 tablet; Refill: 3  9. Prediabetes See #1 above Continue metformin - POCT glucose (manual entry) - POCT glycosylated hemoglobin (Hb A1C) - Microalbumin / creatinine urine ratio  10. Prostate cancer screening Patient agreeable to prostate cancer screening - PSA    Patient was given the opportunity to ask questions.  Patient verbalized understanding of the plan and was able to repeat key elements of the plan.   Orders Placed This Encounter  Procedures  . Fecal occult blood, imunochemical([facility])  . Flu Vaccine QUAD 36+ mos IM  . Microalbumin / creatinine urine ratio  . CBC    . Comprehensive metabolic panel  . Lipid panel  . PSA  . POCT glucose (manual entry)  . POCT glycosylated hemoglobin (Hb A1C)     Requested Prescriptions   Signed Prescriptions Disp Refills  . atorvastatin (LIPITOR) 10 MG tablet 90 tablet 3    Sig: Take 1 tablet (10 mg total) by mouth daily.  Marland Kitchen amLODipine (NORVASC) 10 MG tablet 30 tablet 6    Sig: Take 1 tablet (10 mg total) by mouth daily.    Return in about 3 months (around 11/27/2017).  Jonah Blue, MD, FACP

## 2017-08-27 NOTE — Progress Notes (Signed)
cbg 131 a1c 6.4

## 2017-08-28 LAB — LIPID PANEL
CHOL/HDL RATIO: 4.9 ratio (ref 0.0–5.0)
Cholesterol, Total: 146 mg/dL (ref 100–199)
HDL: 30 mg/dL — ABNORMAL LOW (ref >39)
LDL CALC: 85 mg/dL (ref 0–99)
Triglycerides: 153 mg/dL — ABNORMAL HIGH (ref 0–149)
VLDL CHOLESTEROL CAL: 31 mg/dL (ref 5–40)

## 2017-08-28 LAB — COMPREHENSIVE METABOLIC PANEL
ALBUMIN: 4.4 g/dL (ref 3.5–5.5)
ALK PHOS: 92 IU/L (ref 39–117)
ALT: 54 IU/L — ABNORMAL HIGH (ref 0–44)
AST: 36 IU/L (ref 0–40)
Albumin/Globulin Ratio: 1.3 (ref 1.2–2.2)
BUN / CREAT RATIO: 12 (ref 9–20)
BUN: 17 mg/dL (ref 6–24)
Bilirubin Total: 0.7 mg/dL (ref 0.0–1.2)
CALCIUM: 10 mg/dL (ref 8.7–10.2)
CO2: 21 mmol/L (ref 20–29)
CREATININE: 1.45 mg/dL — AB (ref 0.76–1.27)
Chloride: 104 mmol/L (ref 96–106)
GFR calc non Af Amer: 53 mL/min/{1.73_m2} — ABNORMAL LOW (ref >59)
GFR, EST AFRICAN AMERICAN: 61 mL/min/{1.73_m2} (ref >59)
GLOBULIN, TOTAL: 3.4 g/dL (ref 1.5–4.5)
GLUCOSE: 92 mg/dL (ref 65–99)
Potassium: 4.6 mmol/L (ref 3.5–5.2)
SODIUM: 140 mmol/L (ref 134–144)
Total Protein: 7.8 g/dL (ref 6.0–8.5)

## 2017-08-28 LAB — CBC
Hematocrit: 40.7 % (ref 37.5–51.0)
Hemoglobin: 13.7 g/dL (ref 13.0–17.7)
MCH: 29.1 pg (ref 26.6–33.0)
MCHC: 33.7 g/dL (ref 31.5–35.7)
MCV: 86 fL (ref 79–97)
Platelets: 306 10*3/uL (ref 150–450)
RBC: 4.71 x10E6/uL (ref 4.14–5.80)
RDW: 14.4 % (ref 12.3–15.4)
WBC: 7.1 10*3/uL (ref 3.4–10.8)

## 2017-08-28 LAB — MICROALBUMIN / CREATININE URINE RATIO
CREATININE, UR: 356.3 mg/dL
MICROALBUM., U, RANDOM: 33.9 ug/mL
Microalb/Creat Ratio: 9.5 mg/g creat (ref 0.0–30.0)

## 2017-08-28 LAB — PSA: PROSTATE SPECIFIC AG, SERUM: 1.5 ng/mL (ref 0.0–4.0)

## 2017-09-07 MED FILL — AMLODIPINE BESYLATE 10 MG T: 10 | 30 days supply | Qty: 30 | Fill #0

## 2017-09-07 MED FILL — metFORMIN HCL 500 MG TABS: 500 | 30 days supply | Qty: 30 | Fill #7

## 2017-09-07 MED FILL — ATORVASTATIN 10 MG TABLET: 10 | 30 days supply | Qty: 30 | Fill #7

## 2017-11-22 ENCOUNTER — Encounter: Payer: Self-pay | Admitting: Podiatry

## 2017-11-22 ENCOUNTER — Ambulatory Visit (INDEPENDENT_AMBULATORY_CARE_PROVIDER_SITE_OTHER): Payer: Self-pay | Admitting: Podiatry

## 2017-11-22 VITALS — BP 180/104 | HR 88

## 2017-11-22 DIAGNOSIS — M79674 Pain in right toe(s): Secondary | ICD-10-CM

## 2017-11-22 DIAGNOSIS — B351 Tinea unguium: Secondary | ICD-10-CM

## 2017-11-22 DIAGNOSIS — B353 Tinea pedis: Secondary | ICD-10-CM

## 2017-11-22 DIAGNOSIS — M79675 Pain in left toe(s): Secondary | ICD-10-CM

## 2017-11-22 MED ORDER — CLOTRIMAZOLE 1 % EX CREA
TOPICAL_CREAM | CUTANEOUS | 1 refills | Status: DC
Start: 2017-11-22 — End: 2019-01-20

## 2017-11-22 NOTE — Patient Instructions (Signed)
Athlete's Foot Athlete's foot (tinea pedis) is a fungal infection of the skin on the feet. It often occurs on the skin that is between or underneath the toes. It can also occur on the soles of the feet. The infection can spread from person to person (is contagious). Follow these instructions at home:  Apply or take over-the-counter and prescription medicines only as told by your doctor.  Keep all follow-up visits as told by your doctor. This is important.  Do not scratch your feet.  Keep your feet dry: ? Wear cotton or wool socks. Change your socks every day or if they become wet. ? Wear shoes that allow air to move around, such as sandals or canvas tennis shoes.  Wash and dry your feet: ? Every day or as told by your doctor. ? After exercising. ? Including the area between your toes.  Wear sandals in wet areas, such as locker rooms and shared showers.  Do not share any of these items: ? Towels. ? Nail clippers. ? Other personal items that touch your feet.  If you have diabetes, keep your blood sugar under control. Contact a doctor if:  You have a fever.  You have swelling, soreness, warmth, or redness in your foot.  You are not getting better with treatment.  Your symptoms get worse.  You have new symptoms. This information is not intended to replace advice given to you by your health care provider. Make sure you discuss any questions you have with your health care provider. Document Released: 06/14/2007 Document Revised: 06/03/2015 Document Reviewed: 06/29/2014 Elsevier Interactive Patient Education  2018 Elsevier Inc.  

## 2017-11-27 ENCOUNTER — Ambulatory Visit: Payer: Self-pay | Admitting: Podiatry

## 2017-11-27 ENCOUNTER — Ambulatory Visit: Payer: Self-pay | Attending: Internal Medicine | Admitting: Internal Medicine

## 2017-11-27 ENCOUNTER — Encounter: Payer: Self-pay | Admitting: Internal Medicine

## 2017-11-27 ENCOUNTER — Other Ambulatory Visit: Payer: Self-pay | Admitting: Internal Medicine

## 2017-11-27 VITALS — BP 168/107 | HR 85 | Temp 97.4°F | Resp 16 | Wt 316.8 lb

## 2017-11-27 DIAGNOSIS — R7303 Prediabetes: Secondary | ICD-10-CM

## 2017-11-27 DIAGNOSIS — E1122 Type 2 diabetes mellitus with diabetic chronic kidney disease: Secondary | ICD-10-CM | POA: Insufficient documentation

## 2017-11-27 DIAGNOSIS — Z79899 Other long term (current) drug therapy: Secondary | ICD-10-CM | POA: Insufficient documentation

## 2017-11-27 DIAGNOSIS — N183 Chronic kidney disease, stage 3 unspecified: Secondary | ICD-10-CM

## 2017-11-27 DIAGNOSIS — I10 Essential (primary) hypertension: Secondary | ICD-10-CM

## 2017-11-27 DIAGNOSIS — Z7984 Long term (current) use of oral hypoglycemic drugs: Secondary | ICD-10-CM | POA: Insufficient documentation

## 2017-11-27 DIAGNOSIS — B353 Tinea pedis: Secondary | ICD-10-CM | POA: Insufficient documentation

## 2017-11-27 DIAGNOSIS — E119 Type 2 diabetes mellitus without complications: Secondary | ICD-10-CM

## 2017-11-27 DIAGNOSIS — E785 Hyperlipidemia, unspecified: Secondary | ICD-10-CM | POA: Insufficient documentation

## 2017-11-27 DIAGNOSIS — Z888 Allergy status to other drugs, medicaments and biological substances status: Secondary | ICD-10-CM | POA: Insufficient documentation

## 2017-11-27 DIAGNOSIS — I129 Hypertensive chronic kidney disease with stage 1 through stage 4 chronic kidney disease, or unspecified chronic kidney disease: Secondary | ICD-10-CM | POA: Insufficient documentation

## 2017-11-27 DIAGNOSIS — Z6841 Body Mass Index (BMI) 40.0 and over, adult: Secondary | ICD-10-CM | POA: Insufficient documentation

## 2017-11-27 LAB — POCT GLYCOSYLATED HEMOGLOBIN (HGB A1C): HBA1C, POC (PREDIABETIC RANGE): 6.8 % — AB (ref 5.7–6.4)

## 2017-11-27 LAB — GLUCOSE, POCT (MANUAL RESULT ENTRY): POC Glucose: 174 mg/dl — AB (ref 70–99)

## 2017-11-27 MED FILL — AMLODIPINE BESYLATE 10 MG T: 10 | 30 days supply | Qty: 30 | Fill #1

## 2017-11-27 MED FILL — ATORVASTATIN 10 MG TABLET: 10 | 30 days supply | Qty: 30 | Fill #0

## 2017-11-27 NOTE — Progress Notes (Signed)
Pt states he has been out of his bp medicine for a couple of days   Pt states he has order them

## 2017-11-27 NOTE — Progress Notes (Signed)
Patient ID: Lonnie Brown Printup, male    DOB: [birth year]  MRN: [id number]  CC: Hypertension and Diabetes (prediabetes)   Subjective: Lonnie Brown Surace is a 57 y.o. male who presents for chronic ds management His concerns today include:  History of HTN, CKDstage 3, obesity, HL, pre-DM  Obesity/Pre-DM:  Gained 10 pounds since last visit with me in August and 27 lbs since 03/2017 Eating more bread and pasta.  Admits that he still drinks a lot of sugary drinks and energy drinks Lower back beginning to bother him more when walking more than 2 blocks he attributes this to increase in weight. -A1c today is in the range for diabetes.  He denies numbness or tingling in hands or feet.  No blurred vision DM in mom  HTN: out of Norvasc x few days.  He has already called in refills. Limits salt No device to check BP No CP/SOB/LE edema  I went over lab test with him that were done on last visit.  He has mild elevation in 1 of his liver enzymes.  Kidney function is not 100% but stable. Patient Active Problem List   Diagnosis Date Noted  . CKD (chronic kidney disease) stage 3, GFR 30-59 ml/min (HCC) 08/27/2017  . Prediabetes 08/27/2017  . Hyperlipidemia 09/25/2016  . Obesity 07/20/2015  . Achilles tendinitis 04/09/2015  . Hypertension 03/04/2015  . Muscle spasm of back 03/04/2015     Current Outpatient Medications on File Prior to Visit  Medication Sig Dispense Refill  . amLODipine (NORVASC) 10 MG tablet Take 1 tablet (10 mg total) by mouth daily. 30 tablet 6  . atorvastatin (LIPITOR) 10 MG tablet Take 1 tablet (10 mg total) by mouth daily. 90 tablet 3  . clotrimazole (LOTRIMIN) 1 % cream Apply to both feet and between toes twice daily for 4 weeks 60 g 1  . cyclobenzaprine (FLEXERIL) 10 MG tablet Take 1 tablet (10 mg total) by mouth 2 (two) times daily as needed for muscle spasms. 20 tablet 0  . metFORMIN (GLUCOPHAGE) 500 MG tablet Take 500 mg by mouth daily with breakfast.  3   No current  facility-administered medications on file prior to visit.     Allergies  Allergen Reactions  . Lisinopril Other (See Comments)    Angioedema  . Banana Nausea And Vomiting    Social History   Socioeconomic History  . Marital status: Married    Spouse name: Not on file  . Number of children: Not on file  . Years of education: Not on file  . Highest education level: Not on file  Occupational History  . Not on file  Social Needs  . Financial resource strain: Not on file  . Food insecurity:    Worry: Not on file    Inability: Not on file  . Transportation needs:    Medical: Not on file    Non-medical: Not on file  Tobacco Use  . Smoking status: Never Smoker  . Smokeless tobacco: Never Used  Substance and Sexual Activity  . Alcohol use: No  . Drug use: No  . Sexual activity: Not on file  Lifestyle  . Physical activity:    Days per week: Not on file    Minutes per session: Not on file  . Stress: Not on file  Relationships  . Social connections:    Talks on phone: Not on file    Gets together: Not on file    Attends religious service: Not on file  Active member of club or organization: Not on file    Attends meetings of clubs or organizations: Not on file    Relationship status: Not on file  . Intimate partner violence:    Fear of current or ex partner: Not on file    Emotionally abused: Not on file    Physically abused: Not on file    Forced sexual activity: Not on file  Other Topics Concern  . Not on file  Social History Narrative  . Not on file    Family History  Problem Relation Age of Onset  . Diabetes Mother   . Hypertension Mother     Past Surgical History:  Procedure Laterality Date  . EYE SURGERY    . TONSILLECTOMY      ROS: Review of Systems Negative except as stated above. PHYSICAL EXAM: BP (!) 168/107 (BP Location: Left Arm, Cuff Size: Large) Comment: recheck  Pulse 85   Temp (!) 97.4 F (36.3 C) (Oral)   Resp 16   Wt (!) 316 lb  12.8 oz (143.7 kg)   SpO2 96%   BMI 44.18 kg/m   Wt Readings from Last 3 Encounters:  11/27/17 (!) 316 lb 12.8 oz (143.7 kg)  08/27/17 (!) 306 lb 3.2 oz (138.9 kg)  08/13/17 280 lb (127 kg)    Physical Exam  General appearance - alert, well appearing, and in no distress Mental status - normal mood, behavior, speech, dress, motor activity, and thought processes Neck - supple, no significant adenopathy Chest - clear to auscultation, no wheezes, rales or rhonchi, symmetric air entry Heart - normal rate, regular rhythm, normal S1, S2, no murmurs, rubs, clicks or gallops Extremities - peripheral pulses normal, no pedal edema, no clubbing or cyanosis Diabetic Foot Exam - Simple   Simple Foot Form Visual Inspection See comments:  Yes Sensation Testing See comments:  Yes Pulse Check Posterior Tibialis and Dorsalis pulse intact bilaterally:  Yes Comments Patient with hypopigmentation between the toes on the left foot consistent with tinea pedis.  Very dry peeling skin on the soles of both feet.  Decreased sensation on the soles of the feet on leap exam.     Results for orders placed or performed in visit on 11/27/17  POCT glucose (manual entry)  Result Value Ref Range   POC Glucose 174 (A) 70 - 99 mg/dl  POCT glycosylated hemoglobin (Hb A1C)  Result Value Ref Range   Hemoglobin A1C     HbA1c POC (<> result, manual entry)     HbA1c, POC (prediabetic range) 6.8 (A) 5.7 - 6.4 %   HbA1c, POC (controlled diabetic range)      ASSESSMENT AND PLAN: 1. Controlled type 2 diabetes mellitus without complication, without long-term current use of insulin (HCC) New diagnosis.   Discussed the importance of healthy eating habits, regular aerobic exercise (at least 150 minutes a week as tolerated) and medication compliance to achieve or maintain control of diabetes. -patient will continue with current dose of metformin. He is set a goal to stop the sugary drinks.  He will also try to cut back on  white carbohydrates in particular bread and pasta which he eats often - POCT glucose (manual entry) - POCT glycosylated hemoglobin (Hb A1C)  2. Class 3 severe obesity due to excess calories with serious comorbidity and body mass index (BMI) of 40.0 to 44.9 in adult [facility]) See #1 above  3. Essential hypertension Not at goal.  He will pick up Norvasc today and continue  taking.  4. CKD (chronic kidney disease) stage 3, GFR 30-59 ml/min (HCC) Stable.  Continue to avoid NSAIDs.  5.  Tinea pedis Encouraged him to keep the feet dry and clean.  He will use Lamisil cream over-the-counter.     Patient was given the opportunity to ask questions.  Patient verbalized understanding of the plan and was able to repeat key elements of the plan.   Orders Placed This Encounter  Procedures  . POCT glucose (manual entry)  . POCT glycosylated hemoglobin (Hb A1C)     Requested Prescriptions    No prescriptions requested or ordered in this encounter    Return in about 3 months (around 02/27/2018).  Jonah Blue, MD, FACP

## 2017-11-27 NOTE — Patient Instructions (Addendum)
Please give appointment with the clinical pharmacist in 2 weeks for repeat blood pressure check.  Now that you have diabetes, I recommend having an eye exam done as soon as possible.  It is recommended to have an eye exam done at least once a year.  Diabetes Mellitus and Nutrition When you have diabetes (diabetes mellitus), it is very important to have healthy eating habits because your blood sugar (glucose) levels are greatly affected by what you eat and drink. Eating healthy foods in the appropriate amounts, at about the same times every day, can help you:  Control your blood glucose.  Lower your risk of heart disease.  Improve your blood pressure.  Reach or maintain a healthy weight.  Every person with diabetes is different, and each person has different needs for a meal plan. Your health care provider may recommend that you work with a diet and nutrition specialist (dietitian) to make a meal plan that is best for you. Your meal plan may vary depending on factors such as:  The calories you need.  The medicines you take.  Your weight.  Your blood glucose, blood pressure, and cholesterol levels.  Your activity level.  Other health conditions you have, such as heart or kidney disease.  How do carbohydrates affect me? Carbohydrates affect your blood glucose level more than any other type of food. Eating carbohydrates naturally increases the amount of glucose in your blood. Carbohydrate counting is a method for keeping track of how many carbohydrates you eat. Counting carbohydrates is important to keep your blood glucose at a healthy level, especially if you use insulin or take certain oral diabetes medicines. It is important to know how many carbohydrates you can safely have in each meal. This is different for every person. Your dietitian can help you calculate how many carbohydrates you should have at each meal and for snack. Foods that contain carbohydrates include:  Bread, cereal,  rice, pasta, and crackers.  Potatoes and corn.  Peas, beans, and lentils.  Milk and yogurt.  Fruit and juice.  Desserts, such as cakes, cookies, ice cream, and candy.  How does alcohol affect me? Alcohol can cause a sudden decrease in blood glucose (hypoglycemia), especially if you use insulin or take certain oral diabetes medicines. Hypoglycemia can be a life-threatening condition. Symptoms of hypoglycemia (sleepiness, dizziness, and confusion) are similar to symptoms of having too much alcohol. If your health care provider says that alcohol is safe for you, follow these guidelines:  Limit alcohol intake to no more than 1 drink per day for nonpregnant women and 2 drinks per day for men. One drink equals 12 oz of beer, 5 oz of wine, or 1 oz of hard liquor.  Do not drink on an empty stomach.  Keep yourself hydrated with water, diet soda, or unsweetened iced tea.  Keep in mind that regular soda, juice, and other mixers may contain a lot of sugar and must be counted as carbohydrates.  What are tips for following this plan? Reading food labels  Start by checking the serving size on the label. The amount of calories, carbohydrates, fats, and other nutrients listed on the label are based on one serving of the food. Many foods contain more than one serving per package.  Check the total grams (g) of carbohydrates in one serving. You can calculate the number of servings of carbohydrates in one serving by dividing the total carbohydrates by 15. For example, if a food has 30 g of total carbohydrates, it  would be equal to 2 servings of carbohydrates.  Check the number of grams (g) of saturated and trans fats in one serving. Choose foods that have low or no amount of these fats.  Check the number of milligrams (mg) of sodium in one serving. Most people should limit total sodium intake to less than 2,300 mg per day.  Always check the nutrition information of foods labeled as "low-fat" or  "nonfat". These foods may be higher in added sugar or refined carbohydrates and should be avoided.  Talk to your dietitian to identify your daily goals for nutrients listed on the label. Shopping  Avoid buying canned, premade, or processed foods. These foods tend to be high in fat, sodium, and added sugar.  Shop around the outside edge of the grocery store. This includes fresh fruits and vegetables, bulk grains, fresh meats, and fresh dairy. Cooking  Use low-heat cooking methods, such as baking, instead of high-heat cooking methods like deep frying.  Cook using healthy oils, such as olive, canola, or sunflower oil.  Avoid cooking with butter, cream, or high-fat meats. Meal planning  Eat meals and snacks regularly, preferably at the same times every day. Avoid going long periods of time without eating.  Eat foods high in fiber, such as fresh fruits, vegetables, beans, and whole grains. Talk to your dietitian about how many servings of carbohydrates you can eat at each meal.  Eat 4-6 ounces of lean protein each day, such as lean meat, chicken, fish, eggs, or tofu. 1 ounce is equal to 1 ounce of meat, chicken, or fish, 1 egg, or 1/4 cup of tofu.  Eat some foods each day that contain healthy fats, such as avocado, nuts, seeds, and fish. Lifestyle   Check your blood glucose regularly.  Exercise at least 30 minutes 5 or more days each week, or as told by your health care provider.  Take medicines as told by your health care provider.  Do not use any products that contain nicotine or tobacco, such as cigarettes and e-cigarettes. If you need help quitting, ask your health care provider.  Work with a Veterinary surgeoncounselor or diabetes educator to identify strategies to manage stress and any emotional and social challenges. What are some questions to ask my health care provider?  Do I need to meet with a diabetes educator?  Do I need to meet with a dietitian?  What number can I call if I have  questions?  When are the best times to check my blood glucose? Where to find more information:  American Diabetes Association: diabetes.org/food-and-fitness/food  Academy of Nutrition and Dietetics: https://www.vargas.com/www.eatright.org/resources/health/diseases-and-conditions/diabetes  [employer] Institute of Diabetes and Digestive and Kidney Diseases (NIH): FindJewelers.czwww.niddk.nih.gov/health-information/diabetes/overview/diet-eating-physical-activity Summary  A healthy meal plan will help you control your blood glucose and maintain a healthy lifestyle.  Working with a diet and nutrition specialist (dietitian) can help you make a meal plan that is best for you.  Keep in mind that carbohydrates and alcohol have immediate effects on your blood glucose levels. It is important to count carbohydrates and to use alcohol carefully. This information is not intended to replace advice given to you by your health care provider. Make sure you discuss any questions you have with your health care provider. Document Released: 09/22/2004 Document Revised: 01/31/2016 Document Reviewed: 01/31/2016 Elsevier Interactive Patient Education  [employer]2018 Elsevier Inc.

## 2017-11-30 ENCOUNTER — Other Ambulatory Visit: Payer: Self-pay | Admitting: Internal Medicine

## 2017-11-30 DIAGNOSIS — R7303 Prediabetes: Secondary | ICD-10-CM

## 2017-12-03 NOTE — Telephone Encounter (Signed)
This patients metformin shows it was discontinued by yourself at his 08/27/17 appt, I the notes for that appt you advised the patient to continue on Metformin. I believe the discontinuation may have been done in error, if appropriate please refill.

## 2017-12-04 MED FILL — metFORMIN HCL 500 MG TABS: 500 | 30 days supply | Qty: 30 | Fill #0

## 2017-12-12 ENCOUNTER — Encounter: Payer: Self-pay | Admitting: Pharmacist

## 2017-12-16 ENCOUNTER — Encounter: Payer: Self-pay | Admitting: Podiatry

## 2017-12-16 NOTE — Progress Notes (Signed)
Subjective: Lonnie PoundsMichael Brown presents today accompanied by his wife on today's visit, with cc of painful, discolored, thick toenails which interfere with daily activities.  Duration is longstanding. Pain is aggravated when wearing enclosed shoe gear. He has tried nothing to treat condition.    Past Medical History:  Diagnosis Date  . Hypertension     Patient Active Problem List   Diagnosis Date Noted  . CKD (chronic kidney disease) stage 3, GFR 30-59 ml/min (HCC) 08/27/2017  . Prediabetes 08/27/2017  . Hyperlipidemia 09/25/2016  . Obesity 07/20/2015  . Achilles tendinitis 04/09/2015  . Hypertension 03/04/2015  . Muscle spasm of back 03/04/2015    Past Surgical History:  Procedure Laterality Date  . EYE SURGERY    . TONSILLECTOMY        Allergies  Allergen Reactions  . Lisinopril Other (See Comments)    Angioedema  . Banana Nausea And Vomiting    Social History   Occupational History  . Not on file  Tobacco Use  . Smoking status: Never Smoker  . Smokeless tobacco: Never Used  Substance and Sexual Activity  . Alcohol use: No  . Drug use: No  . Sexual activity: Not on file   Family History  Problem Relation Age of Onset  . Diabetes Mother   . Hypertension Mother   . Alcoholism Father    Immunization History  Administered Date(s) Administered  . Influenza,inj,Quad PF,6+ Mos 10/12/2015, 09/25/2016, 08/27/2017  . Rabies, IM 06/14/2016, 06/17/2016, 06/21/2016, 06/28/2016  . Tdap 06/14/2016    Review of systems: Positive Findings in bold print.  Constitutional:  chills, fatigue, fever, sweats, weight change Communication: Nurse, learning disabilitytranslator, sign Presenter, broadcastinglanguage translator, hand writing, iPad/Android device Eyes: diplopia, glare,  light sensitivity, eyeglasses, blindness Ears nose mouth throat: Hard of hearing, deaf, sign language,  vertigo,   bloody nose,  rhinitis,  cold sores, snoring Cardiovascular: HTN, edema, arrhythmia, pacemaker in place, defibrillator in place,   chest pain/tightness, chronic anticoagulation, blood clot Respiratory:  difficulty breathing, denies congestion, SOB, wheezing, cough Gastrointestinal: abdominal pain, diarrhea, nausea, vomiting,  Genitourinary:  nocturia,  pain on urination,  blood in urine, Foley catheter, urinary urgency Musculoskeletal: Uses mobility aid,  cramping, stiff joints, painful joints, back pain Skin: +changes in toenails, color change dryness, itchy skin, mole changes, or rash  Neurological: numbness, paresthesias, burning in feet, denies fainting,  seizure, change in speech. denies headaches, memory problems/poor historian, cerebral palsy Endocrine: diabetes, hypothyroidism, hyperthyroidism,  dry mouth, flushing, denies heat intolerance,  cold intolerance,  excessive thirst, denies polyuria,  nocturia Hematological:  easy bleeding,  excessive bleeding, easy bruising, enlarged lymph nodes, on long term blood thinner Allergy/immunological:  hive, frequent infections, multiple drug allergies, seasonal allergies,  Psychiatric:  anxiety, depression, mood disorder, suicidal ideations, hallucinations   Objective: Vascular Examination: Capillary refill time immediate x 10 digits Dorsalis pedis and posterior tibial pulses present b/l No digital hair x 10 digits Skin temperature gradient WNL b/l  Dermatological Examination: Skin with normal turgor and tone b/l  Diffuse scaling noted peripherally and plantarly b/l feet with mild foot odor.  No interdigital macerations.  No blisters, no weeping. No signs of secondary bacterial infection noted.  Toenails 1-5 b/l discolored, thick, dystrophic with subungual debris and pain with palpation to nailbeds due to thickness of nails.  Musculoskeletal: Muscle strength 5/5 to all LE muscle groups  Neurological: Sensation intact with 10 gram monofilament Vibratory sensation intact.  Assessment: 1. Painful onychomycosis toenails 1-5 b/l  2. Tinea pedis  b/l  Plan: 1.  Discussed onychomycosis and treatment options.  Literature dispensed on today. 2. Toenails 1-5 b/l were debrided in length and girth without iatrogenic bleeding. Rx for Clotrimazole Cream 1% to be applied to both feet and between toes bid x 4 weeks 3. Patient to continue soft, supportive shoe gear 4. Patient to report any pedal injuries to medical professional immediately. 5. Follow up 3 months. Patient/POA to call should there be a concern in the interim.

## 2018-01-06 ENCOUNTER — Encounter (HOSPITAL_COMMUNITY): Payer: Self-pay | Admitting: *Deleted

## 2018-01-06 ENCOUNTER — Emergency Department (HOSPITAL_COMMUNITY)
Admission: EM | Admit: 2018-01-06 | Discharge: 2018-01-06 | Disposition: A | Discharge status: Home / Self Care | Payer: Self-pay | Attending: Emergency Medicine | Admitting: Emergency Medicine

## 2018-01-06 ENCOUNTER — Emergency Department (HOSPITAL_COMMUNITY): Payer: Self-pay

## 2018-01-06 DIAGNOSIS — Z7984 Long term (current) use of oral hypoglycemic drugs: Secondary | ICD-10-CM | POA: Insufficient documentation

## 2018-01-06 DIAGNOSIS — N183 Chronic kidney disease, stage 3 (moderate): Secondary | ICD-10-CM | POA: Insufficient documentation

## 2018-01-06 DIAGNOSIS — G4730 Sleep apnea, unspecified: Secondary | ICD-10-CM | POA: Insufficient documentation

## 2018-01-06 DIAGNOSIS — E785 Hyperlipidemia, unspecified: Secondary | ICD-10-CM | POA: Insufficient documentation

## 2018-01-06 DIAGNOSIS — Z79899 Other long term (current) drug therapy: Secondary | ICD-10-CM | POA: Insufficient documentation

## 2018-01-06 DIAGNOSIS — I129 Hypertensive chronic kidney disease with stage 1 through stage 4 chronic kidney disease, or unspecified chronic kidney disease: Secondary | ICD-10-CM | POA: Insufficient documentation

## 2018-01-06 NOTE — ED Provider Notes (Signed)
[facility] Provider Note   CSN: [id number] Arrival date & time: 01/06/18  0732     History   Chief Complaint Chief Complaint  Patient presents with  . Shortness of Breath    HPI Lonnie Brown is a 57 y.o. male.  57 year old male presents with dyspnea which only occurs at night when he goes to sleep.  States he does snore and wakes up gasping for air.  Denies any associated chest pain or chest pressure.  No recent fever, cough, congestion.  No peripheral edema.  No dyspnea on exertion.  There is no recent weight gain.  Symptoms better with sitting up.  Denies any GERD-like symptoms.  No treatment used prior to arrival.     Past Medical History:  Diagnosis Date  . Hypertension     Patient Active Problem List   Diagnosis Date Noted  . CKD (chronic kidney disease) stage 3, GFR 30-59 ml/min (HCC) 08/27/2017  . Prediabetes 08/27/2017  . Hyperlipidemia 09/25/2016  . Obesity 07/20/2015  . Achilles tendinitis 04/09/2015  . Hypertension 03/04/2015  . Muscle spasm of back 03/04/2015    Past Surgical History:  Procedure Laterality Date  . EYE SURGERY    . TONSILLECTOMY          Home Medications    Prior to Admission medications   Medication Sig Start Date End Date Taking? Authorizing Provider  amLODipine (NORVASC) 10 MG tablet Take 1 tablet (10 mg total) by mouth daily. 08/27/17   Marcine MatarJohnson, Deborah B, MD  atorvastatin (LIPITOR) 10 MG tablet Take 1 tablet (10 mg total) by mouth daily. 08/27/17   Marcine MatarJohnson, Deborah B, MD  clotrimazole (LOTRIMIN) 1 % cream Apply to both feet and between toes twice daily for 4 weeks 11/22/17   Freddie BreechGalaway, Jennifer L, DPM  cyclobenzaprine (FLEXERIL) 10 MG tablet Take 1 tablet (10 mg total) by mouth 2 (two) times daily as needed for muscle spasms. 03/20/17   Roxy HorsemanBrowning, Robert, PA-C  metFORMIN (GLUCOPHAGE) 500 MG tablet Take 500 mg by mouth daily with breakfast. 09/07/17   [provider]  metFORMIN  (GLUCOPHAGE) 500 MG tablet TAKE 1 TABLET BY MOUTH DAILY WITH BREAKFAST. 12/03/17   Marcine MatarJohnson, Deborah B, MD    Family History Family History  Problem Relation Age of Onset  . Diabetes Mother   . Hypertension Mother   . Alcoholism Father     Social History Social History   Tobacco Use  . Smoking status: Never Smoker  . Smokeless tobacco: Never Used  Substance Use Topics  . Alcohol use: No  . Drug use: No     Allergies   Lisinopril and Banana   Review of Systems Review of Systems  All other systems reviewed and are negative.    Physical Exam Updated Vital Signs BP (!) 177/87 (BP Location: Right Arm)   Pulse 80   Temp 97.7 F (36.5 C) (Oral)   Resp 20   SpO2 100%   Physical Exam Vitals signs and nursing note reviewed.  Constitutional:      General: He is not in acute distress.    Appearance: Normal appearance. He is well-developed. He is not toxic-appearing.  HENT:     Head: Normocephalic and atraumatic.  Eyes:     General: Lids are normal.     Conjunctiva/sclera: Conjunctivae normal.     Pupils: Pupils are equal, round, and reactive to light.  Neck:     Musculoskeletal: Normal range of motion and neck supple.  Thyroid: No thyroid mass.     Trachea: No tracheal deviation.  Cardiovascular:     Rate and Rhythm: Normal rate and regular rhythm.     Heart sounds: Normal heart sounds. No murmur. No gallop.   Pulmonary:     Effort: Pulmonary effort is normal. No respiratory distress.     Breath sounds: Normal breath sounds. No stridor. No decreased breath sounds, wheezing, rhonchi or rales.  Abdominal:     General: Bowel sounds are normal. There is no distension.     Palpations: Abdomen is soft.     Tenderness: There is no abdominal tenderness. There is no rebound.  Musculoskeletal: Normal range of motion.        General: No tenderness.  Skin:    General: Skin is warm and dry.     Findings: No abrasion or rash.  Neurological:     Mental Status: He is  alert and oriented to person, place, and time.     GCS: GCS eye subscore is 4. GCS verbal subscore is 5. GCS motor subscore is 6.     Cranial Nerves: No cranial nerve deficit.     Sensory: No sensory deficit.  Psychiatric:        Speech: Speech normal.        Behavior: Behavior normal.      ED Treatments / Results  Labs (all labs ordered are listed, but only abnormal results are displayed) Labs Reviewed - No data to display  EKG EKG Interpretation  Date/Time:  Sunday January 06 2018 07:38:21 EST Ventricular Rate:  80 PR Interval:  166 QRS Duration: 110 QT Interval:  382 QTC Calculation: 440 R Axis:   -24 Text Interpretation:  Normal sinus rhythm Inferior infarct , age undetermined Anterior infarct , age undetermined Abnormal ECG No significant change since last tracing Confirmed by Lorre NickAllen, Jahel Wavra ([id number]) on 01/06/2018 8:17:21 AM   Radiology No results found.  Procedures Procedures (including critical care time)  Medications Ordered in ED Medications - No data to display   Initial Impression / Assessment and Plan / ED Course  I have reviewed the triage vital signs and the nursing notes.  Pertinent labs & imaging results that were available during my care of the patient were reviewed by me and considered in my medical decision making (see chart for details).     Chest x-ray is negative per radiology.  Suspect sleep apnea as a cause of his dyspnea.  No suspicion for ACS, PE or CHF.  Patient stable for discharge home and follow-up [facility] the [facility].  Final Clinical Impressions(s) / ED Diagnoses   Final diagnoses:  None    ED Discharge Orders    None       Lorre NickAllen, Mandalyn Pasqua, MD 01/06/18 [phone]

## 2018-01-06 NOTE — ED Triage Notes (Signed)
Pt in c/o shortness of breath when he lays down, states this has happened before, he will be fine for a while and then has to sit back up, denies pain, no distress noted

## 2018-01-06 NOTE — ED Notes (Signed)
Pt verbalized understanding to follow up with pcp and no further questions, Pt educated about sleep apnea. Denies shob upon d/c. VSS, NAD

## 2018-01-18 ENCOUNTER — Telehealth: Payer: Self-pay | Admitting: Internal Medicine

## 2018-01-18 NOTE — Telephone Encounter (Signed)
Will forward to pcp

## 2018-01-18 NOTE — Telephone Encounter (Signed)
Patient came to the facility to drop off a form to be filled out by PCP. Patient stated that he needs the form for Monday 1/13 and he was informed that paperwork takes 7-14 days to be filled out. Patient asked to see if PCP could possibly fill out the form by Monday and per PCP she stated that she would take a look at it and did not guarantee the form being filled out. Form will be given to PCP.

## 2018-01-22 MED FILL — AMLODIPINE BESYLATE 10 MG T: 10 | 30 days supply | Qty: 30 | Fill #2

## 2018-01-22 MED FILL — metFORMIN HCL 500 MG TABS: 500 | 30 days supply | Qty: 30 | Fill #1

## 2018-01-22 MED FILL — ATORVASTATIN 10 MG TABLET: 10 | 30 days supply | Qty: 30 | Fill #1

## 2018-01-25 NOTE — Telephone Encounter (Signed)
Contacted pt and pt was unavailable informed wife that form was ready for pick up and she asked if she is allowed to pick it up and I told her it was fine and the form will be up front

## 2018-02-21 ENCOUNTER — Ambulatory Visit: Payer: Self-pay | Admitting: Podiatry

## 2018-02-28 ENCOUNTER — Ambulatory Visit: Payer: Self-pay | Attending: Internal Medicine | Admitting: Internal Medicine

## 2018-02-28 ENCOUNTER — Encounter: Payer: Self-pay | Admitting: Internal Medicine

## 2018-02-28 VITALS — BP 148/98 | HR 85 | Temp 97.7°F | Resp 16 | Ht 71.0 in | Wt 314.2 lb

## 2018-02-28 DIAGNOSIS — Z9114 Patient's other noncompliance with medication regimen: Secondary | ICD-10-CM

## 2018-02-28 DIAGNOSIS — E119 Type 2 diabetes mellitus without complications: Secondary | ICD-10-CM

## 2018-02-28 DIAGNOSIS — I10 Essential (primary) hypertension: Secondary | ICD-10-CM

## 2018-02-28 DIAGNOSIS — Z6841 Body Mass Index (BMI) 40.0 and over, adult: Secondary | ICD-10-CM

## 2018-02-28 DIAGNOSIS — Z1211 Encounter for screening for malignant neoplasm of colon: Secondary | ICD-10-CM

## 2018-02-28 DIAGNOSIS — N529 Male erectile dysfunction, unspecified: Secondary | ICD-10-CM

## 2018-02-28 DIAGNOSIS — R7303 Prediabetes: Secondary | ICD-10-CM

## 2018-02-28 LAB — POCT GLYCOSYLATED HEMOGLOBIN (HGB A1C): HbA1c, POC (controlled diabetic range): 6.7 % (ref 0.0–7.0)

## 2018-02-28 LAB — GLUCOSE, POCT (MANUAL RESULT ENTRY): POC Glucose: 174 mg/dl — AB (ref 70–99)

## 2018-02-28 MED ORDER — SILDENAFIL CITRATE 50 MG PO TABS: ORAL_TABLET | ORAL | 5 refills | Status: DC

## 2018-02-28 MED FILL — !VIAGRA 50 MG TABLET: 50 | 30 days supply | Qty: 10 | Fill #0

## 2018-02-28 NOTE — Progress Notes (Signed)
Patient ID: Lonnie Brown, male    DOB: 02-19-1960  MRN: [id number]  CC: Diabetes and Hypertension   Subjective: Lonnie PoundsMichael Brown is a 58 y.o. male who presents for chronic disease management History of HTN, CKDstage 3, obesity, HL, pre-DM His concerns today include:  Erectile Dysfuction: He believes this started about a year ago. Problems getting and maintaining erection.  He thinks it may be related to his blood pressure medication Norvasc so he has not been taking the med consistently.  On average, he takes his BP med about 3 x a wk.  He would like to try Viagra.  Hypertension: No device to check BP.  Does not use added table salt but admits to eating fast foods a lot.  Not consistent with taking Norvasc as stated above.  Denies CP/SOB/LE edema -not active outside of work Nonsmoker  HL:  Not taking atorvastatin 10 mg consistently.   DM II: Today's HgbA1C is 6.7; previous HgbA1C was 6.8.  He is not checking his blood glucose at home; no testing device. He is not compliant with diet. Not taking Metformin consistently. He denies adverse effects from metformin.  Last Eye Examination: Its been since 2012. He went to the podiatrist in Nov 2019 to have toe nails clipped. Denies numbness and tingling.  Health Maintenance: He is due for a colon cancer screening and eye exam.  Given FIT test last yr.  Still has packet at home unused  Patient Active Problem List   Diagnosis Date Noted  . CKD (chronic kidney disease) stage 3, GFR 30-59 ml/min (HCC) 08/27/2017  . Prediabetes 08/27/2017  . Hyperlipidemia 09/25/2016  . Obesity 07/20/2015  . Achilles tendinitis 04/09/2015  . Hypertension 03/04/2015  . Muscle spasm of back 03/04/2015     Current Outpatient Medications on File Prior to Visit  Medication Sig Dispense Refill  . amLODipine (NORVASC) 10 MG tablet Take 1 tablet (10 mg total) by mouth daily. 30 tablet 6  . atorvastatin (LIPITOR) 10 MG tablet Take 1 tablet (10 mg total)  by mouth daily. 90 tablet 3  . clotrimazole (LOTRIMIN) 1 % cream Apply to both feet and between toes twice daily for 4 weeks (Patient taking differently: Apply 1 application topically daily as needed (for rash feet). Apply to both feet and between toes twice daily for 4 weeks) 60 g 1  . cyclobenzaprine (FLEXERIL) 10 MG tablet Take 1 tablet (10 mg total) by mouth 2 (two) times daily as needed for muscle spasms. (Patient not taking: Reported on 01/06/2018) 20 tablet 0  . divalproex (DEPAKOTE ER) 500 MG 24 hr tablet Take 500 mg by mouth daily.    . metFORMIN (GLUCOPHAGE) 500 MG tablet TAKE 1 TABLET BY MOUTH DAILY WITH BREAKFAST. (Patient taking differently: Take 500 mg by mouth daily with breakfast. ) 90 tablet 3   No current facility-administered medications on file prior to visit.     Allergies  Allergen Reactions  . Lisinopril Other (See Comments)    Angioedema  . Banana Nausea And Vomiting    Social History   Socioeconomic History  . Marital status: Married    Spouse name: Not on file  . Number of children: Not on file  . Years of education: Not on file  . Highest education level: Not on file  Occupational History  . Not on file  Social Needs  . Financial resource strain: Not on file  . Food insecurity:    Worry: Not on file    Inability: Not  on file  . Transportation needs:    Medical: Not on file    Non-medical: Not on file  Tobacco Use  . Smoking status: Never Smoker  . Smokeless tobacco: Never Used  Substance and Sexual Activity  . Alcohol use: No  . Drug use: No  . Sexual activity: Not on file  Lifestyle  . Physical activity:    Days per week: Not on file    Minutes per session: Not on file  . Stress: Not on file  Relationships  . Social connections:    Talks on phone: Not on file    Gets together: Not on file    Attends religious service: Not on file    Active member of club or organization: Not on file    Attends meetings of clubs or organizations: Not on  file    Relationship status: Not on file  . Intimate partner violence:    Fear of current or ex partner: Not on file    Emotionally abused: Not on file    Physically abused: Not on file    Forced sexual activity: Not on file  Other Topics Concern  . Not on file  Social History Narrative  . Not on file    Family History  Problem Relation Age of Onset  . Diabetes Mother   . Hypertension Mother   . Alcoholism Father     Past Surgical History:  Procedure Laterality Date  . EYE SURGERY    . TONSILLECTOMY      ROS: Review of Systems  Constitutional: Negative for activity change, chills, fatigue, fever and unexpected weight change.  Eyes: Negative for visual disturbance.  Respiratory: Negative for shortness of breath and wheezing.   Cardiovascular: Negative for chest pain, palpitations and leg swelling.  Gastrointestinal: Negative for abdominal distention, constipation, diarrhea, nausea and vomiting.  Endocrine: Negative for polydipsia, polyphagia and polyuria.  Genitourinary:       Complains of Erectile Dysfuction  Musculoskeletal: Negative for arthralgias.  Skin: Negative for color change, pallor, rash and wound.  Neurological: Negative for facial asymmetry, speech difficulty, numbness and headaches.  Psychiatric/Behavioral: Negative for agitation.    PHYSICAL EXAM: BP (!) 157/106   Pulse 85   Temp 97.7 F (36.5 C) (Oral)   Resp 16   Ht 5\' 11"  (1.803 m)   Wt (!) 314 lb 3.2 oz (142.5 kg)   SpO2 96%   BMI 43.82 kg/m   Wt Readings from Last 3 Encounters:  02/28/18 (!) 314 lb 3.2 oz (142.5 kg)  11/27/17 (!) 316 lb 12.8 oz (143.7 kg)  08/27/17 (!) 306 lb 3.2 oz (138.9 kg)    Physical Exam General appearance - alert, well appearing, obese older AAM and in no distress Neck - supple, no significant adenopathy Chest - clear to auscultation, no wheezes, rales or rhonchi, symmetric air entry Heart - normal rate, regular rhythm, normal S1, S2, no murmurs, rubs, clicks or  gallops Extremities - peripheral pulses normal, no pedal edema, no clubbing or cyanosis Diabetic Foot Exam - Simple   Simple Foot Form Visual Inspection See comments:  Yes Sensation Testing Intact to touch and monofilament testing bilaterally:  Yes Pulse Check Posterior Tibialis and Dorsalis pulse intact bilaterally:  Yes Comments Toe nails slightly over grown    BS 174/A1C 6.7  CMP Latest Ref Rng & Units 08/27/2017 01/27/2017 12/30/2016  Glucose 65 - 99 mg/dL 92 161(W125(H) 960(A121(H)  BUN 6 - 24 mg/dL 17 14 16   Creatinine 0.76 - 1.27  mg/dL 7.12(R) 9.75(O) 8.32(P)  Sodium 134 - 144 mmol/L 140 135 138  Potassium 3.5 - 5.2 mmol/L 4.6 4.0 4.1  Chloride 96 - 106 mmol/L 104 104 108  CO2 20 - 29 mmol/L 21 22 23   Calcium 8.7 - 10.2 mg/dL 49.8 9.2 8.9  Total Protein 6.0 - 8.5 g/dL 7.8 - -  Total Bilirubin 0.0 - 1.2 mg/dL 0.7 - -  Alkaline Phos 39 - 117 IU/L 92 - -  AST 0 - 40 IU/L 36 - -  ALT 0 - 44 IU/L 54(H) - -   Lipid Panel     Component Value Date/Time   CHOL 146 08/27/2017 1644   TRIG 153 (H) 08/27/2017 1644   HDL 30 (L) 08/27/2017 1644   CHOLHDL 4.9 08/27/2017 1644   LDLCALC 85 08/27/2017 1644    CBC    Component Value Date/Time   WBC 7.1 08/27/2017 1644   WBC 4.7 01/27/2017 1321   RBC 4.71 08/27/2017 1644   RBC 4.58 01/27/2017 1321   HGB 13.7 08/27/2017 1644   HCT 40.7 08/27/2017 1644   PLT 306 08/27/2017 1644   MCV 86 08/27/2017 1644   MCH 29.1 08/27/2017 1644   MCH 29.5 01/27/2017 1321   MCHC 33.7 08/27/2017 1644   MCHC 33.6 01/27/2017 1321   RDW 14.4 08/27/2017 1644   LYMPHSABS 2.7 03/13/2016 0237   MONOABS 0.5 03/13/2016 0237   EOSABS 0.2 03/13/2016 0237   BASOSABS 0.0 03/13/2016 0237    ASSESSMENT AND PLAN:  1. Controlled type 2 diabetes mellitus without complication, without long-term current use of insulin (HCC) Discussed the importance of healthy eating habits, regular aerobic exercise (at least 150 minutes a week as tolerated) and medication compliance  to achieve or maintain control of diabetes and prevent DM complications. -discussed and encourage healthier food choices even when he eats out Encourage him to have eye exam done. Can be done at reasonable cost at [facility] or [facility] - POCT glucose  - POCT glycosylated hemoglobin   Consistent medication compliance discussed with patient.   2. Essential hypertension Not at Goal/Uncontrolled  BP 157/106 recheck 148/98 - Discussed risk of CV events associated with uncontrolled HTN.  We can give something to help with ED provided he commits to taking Norvasc consistently which he agrees to do. -see clinical pharmacist in 1 mth for repeat BP check  3. Class 3 severe obesity due to excess calories without serious comorbidity with body mass index (BMI) of 40.0 to 44.9 in adult [facility]) - see #1 above.  4. Erectile dysfunction, unspecified erectile dysfunction type Will start him on Sildenafil.  Went over how med works and possible S.E Pt advised of possible side effects of this medication including prolong erection, flushing, headaches, stuff nose and sudden vision and hearing changes.  Pt told to be seen in ER if he has erection lasting longer than 3-4 hrs, or if he has sudden vision changes or hearing loss.  5. Screening for colon cancer -  He was given a FIT Test in August 2019; he has not completed FIT Testing.  Discussed recommendation for colon CA screening in average risk persons.  He states he will complete FIT testing and mail it off before next visit.   6. Noncompliance with medications  - Complications of uncontrolled hypertension and diabetes discussed with patient. - Patient states that he will be more compliant with medication regimen and taking medications consistently.    Patient was given the opportunity to ask questions.  Patient  verbalized understanding of the plan and was able to repeat key elements of the plan.   Orders Placed This Encounter  Procedures  . POCT glucose (manual  entry)  . POCT glycosylated hemoglobin (Hb A1C)     Requested Prescriptions    No prescriptions requested or ordered in this encounter    Jonah Blue, MD, Jerrel Ivory

## 2018-02-28 NOTE — Patient Instructions (Signed)
Please give patient an appointment with the clinical pharmacist in 3 weeks for repeat blood pressure check.  Please take your medicines every day as prescribed and discussed today.  Please turn in the stool study for colon cancer screening  Follow a Healthy Eating Plan - You can do it! Limit sugary drinks.  Avoid sodas, sweet tea, sport or energy drinks, or fruit drinks.  Drink water, lo-fat milk, or diet drinks. Limit snack foods.   Cut back on candy, cake, cookies, chips, ice cream.  These are a special treat, only in small amounts. Eat plenty of vegetables.  Especially dark green, red, and orange vegetables. Aim for at least 3 servings a day. More is better! Include fruit in your daily diet.  Whole fruit is much healthier than fruit juice! Limit "white" bread, "white" pasta, "white" rice.   Choose "100% whole grain" products, brown or wild rice. Avoid fatty meats. Try "Meatless Monday" and choose eggs or beans one day a week.  When eating meat, choose lean meats like chicken, [place], and fish.  Grill, broil, or bake meats instead of frying, and eat poultry without the skin. Eat less salt.  Avoid frozen pizzas, frozen dinners and salty foods.  Use seasonings other than salt in cooking.  This can help blood pressure and keep you from swelling Beer, wine and liquor have calories.  If you can safely drink alcohol, limit to 1 drink per day for women, 2 drinks for men

## 2018-03-01 ENCOUNTER — Telehealth: Payer: Self-pay

## 2018-03-01 DIAGNOSIS — Z9114 Patient's other noncompliance with medication regimen: Secondary | ICD-10-CM | POA: Insufficient documentation

## 2018-03-01 DIAGNOSIS — E119 Type 2 diabetes mellitus without complications: Secondary | ICD-10-CM | POA: Insufficient documentation

## 2018-03-01 DIAGNOSIS — Z91148 Patient's other noncompliance with medication regimen for other reason: Secondary | ICD-10-CM | POA: Insufficient documentation

## 2018-03-01 DIAGNOSIS — N529 Male erectile dysfunction, unspecified: Secondary | ICD-10-CM | POA: Insufficient documentation

## 2018-03-01 LAB — BASIC METABOLIC PANEL
BUN/Creatinine Ratio: 12 (ref 9–20)
BUN: 18 mg/dL (ref 6–24)
CO2: 25 mmol/L (ref 20–29)
Calcium: 9.7 mg/dL (ref 8.7–10.2)
Chloride: 101 mmol/L (ref 96–106)
Creatinine, Ser: 1.55 mg/dL — ABNORMAL HIGH (ref 0.76–1.27)
GFR calc Af Amer: 56 mL/min/{1.73_m2} — ABNORMAL LOW (ref >59)
GFR calc non Af Amer: 48 mL/min/{1.73_m2} — ABNORMAL LOW (ref >59)
Glucose: 132 mg/dL — ABNORMAL HIGH (ref 65–99)
Potassium: 4.5 mmol/L (ref 3.5–5.2)
SODIUM: 141 mmol/L (ref 134–144)

## 2018-03-01 NOTE — Telephone Encounter (Signed)
Contacted pt to go over lab results pt didn't answer lvm asking pt to give me a call at his earliest convenience  

## 2018-03-25 ENCOUNTER — Other Ambulatory Visit: Payer: Self-pay

## 2018-03-25 ENCOUNTER — Encounter: Payer: Self-pay | Admitting: Pharmacist

## 2018-03-25 ENCOUNTER — Ambulatory Visit: Payer: Self-pay | Attending: Family Medicine | Admitting: Pharmacist

## 2018-03-25 DIAGNOSIS — I10 Essential (primary) hypertension: Secondary | ICD-10-CM

## 2018-03-25 MED ORDER — HYDROCHLOROTHIAZIDE 25 MG PO TABS: 25.0000 mg | ORAL_TABLET | Freq: Every day | ORAL | 0 refills | Status: DC

## 2018-03-25 NOTE — Patient Instructions (Signed)
Thank you for coming to see [place] today.   Blood pressure today is elevated but improving.  Continue amlodipine. Take this before bedtime.  Start taking HCTZ, 1 tablet a day. You need to take this in the morning.   Limiting salt and caffeine, as well as exercising as able for at least 30 minutes for 5 days out of the week, can also help you lower your blood pressure.  Take your blood pressure at home if you are able. Please write down these numbers and bring them to your visits.  If you have any questions about medications, please call me [phone].  Franky Macho

## 2018-03-25 NOTE — Progress Notes (Signed)
   S:    PCP: Dr. Laural Benes  Patient arrives in good spirits. Presents to the clinic for hypertension management. Patient was referred by Dr. Laural Benes on 02/28/18. BP 148/98 at that visit but pt reported non-compliance with amlodipine. Dr. Laural Benes had him restart his amlodipine and come see me.   Patient reports adherence with medications. Denies chest pain, dyspnea, HA or blurred vision. Denies BLE edema.   Current BP Medications include:   - amlodipine 10 mg daily  Antihypertensives tried in the past include:  - lisinopril-HCTZ (angioedema)  Dietary habits include: limits adding salt to food but does eat out frequently; drinks 1 soda/day Exercise habits include:limited outside of work d/y lower back pain Family / Social history:  - FHx: DM, HTN (mother - decsd) - Tobacco: never smoker - Alcohol: denies use  Home BP readings: not taking at home  O:  L arm after 5 minutes rest: 148/84, HR 78  Last 3 Office BP readings: BP Readings from Last 3 Encounters:  02/28/18 (!) 148/98  01/06/18 (!) 154/84  11/27/17 (!) 168/107   BMET    Component Value Date/Time   NA 141 02/28/2018 1548   K 4.5 02/28/2018 1548   CL 101 02/28/2018 1548   CO2 25 02/28/2018 1548   GLUCOSE 132 (H) 02/28/2018 1548   GLUCOSE 125 (H) 01/27/2017 1321   BUN 18 02/28/2018 1548   CREATININE 1.55 (H) 02/28/2018 1548   CALCIUM 9.7 02/28/2018 1548   GFRNONAA 48 (L) 02/28/2018 1548   GFRAA 56 (L) 02/28/2018 1548   Renal function: CrCl cannot be calculated (Patient's most recent lab result is older than the maximum 21 days allowed.).  Clinical ASCVD: No  The 10-year ASCVD risk score Denman George DC Jr., et al., 2013) is: 30.9%   Values used to calculate the score:     Age: 40 years     Sex: Male     Is Non-Hispanic African American: Yes     Diabetic: Yes     Tobacco smoker: No     Systolic Blood Pressure: 148 mmHg     Is BP treated: Yes     HDL Cholesterol: 30 mg/dL     Total Cholesterol: 146 mg/dL  A/P:  Hypertension longstanding currently uncontrolled on current medications. BP Goal <130/80 mmHg. Patient is adherent with current medications. CrCl <30 usually renders thiazide diuretics ineffective; patient's renal function is okay for now. Will monitor closely; I have encouraged him to drink plenty of water.  -Continued amlodipine 10 mg. -Add HCTZ 25 mg daily  -Counseled on lifestyle modifications for blood pressure control including reduced dietary sodium, increased exercise, adequate sleep -Pneumovax given; will coordinate with patient assistance  Results reviewed and written information provided. Total time in face-to-face counseling 15 minutes.   F/U Clinic Visit in 1 month.    Butch Penny, PharmD, CPP Clinical Pharmacist [facility] [phone]

## 2018-04-25 ENCOUNTER — Other Ambulatory Visit: Payer: Self-pay | Admitting: Pharmacist

## 2018-04-25 ENCOUNTER — Telehealth: Payer: Self-pay | Admitting: Pharmacist

## 2018-04-25 ENCOUNTER — Ambulatory Visit: Payer: Self-pay | Admitting: Pharmacist

## 2018-04-25 MED FILL — AMLODIPINE BESYLATE 10 MG T: 10 | 30 days supply | Qty: 30 | Fill #3

## 2018-04-25 MED FILL — ATORVASTATIN 10 MG TABLET: 10 | 30 days supply | Qty: 30 | Fill #2

## 2018-04-25 MED FILL — metFORMIN HCL 500 MG TABS: 500 | 30 days supply | Qty: 30 | Fill #2

## 2018-04-25 NOTE — Telephone Encounter (Signed)
Received call from patient's wife informing me that he would not be making his appointment this morning. She requests refills and reveals that Mr. Shroff has not been taking medications consistently. I verified via our pharmacy records that he has not picked up medications since 01/2018. Refills processed - they will be mailed to the patient.   Of note, his BP was above goal on amlodipine alone during his visit with me last month. I added HCTZ to his regimen but he never picked this up. I will hold off on refilling that after discussion with his wife. His BP is most likely above goal d/t noncompliance. Will route information to patient's PCP.

## 2018-05-30 ENCOUNTER — Encounter: Payer: Self-pay | Admitting: Internal Medicine

## 2018-05-30 ENCOUNTER — Ambulatory Visit: Payer: Self-pay | Attending: Internal Medicine | Admitting: Internal Medicine

## 2018-05-30 ENCOUNTER — Other Ambulatory Visit: Payer: Self-pay

## 2018-05-30 DIAGNOSIS — N183 Chronic kidney disease, stage 3 unspecified: Secondary | ICD-10-CM

## 2018-05-30 DIAGNOSIS — I10 Essential (primary) hypertension: Secondary | ICD-10-CM

## 2018-05-30 DIAGNOSIS — Z1211 Encounter for screening for malignant neoplasm of colon: Secondary | ICD-10-CM

## 2018-05-30 DIAGNOSIS — R7303 Prediabetes: Secondary | ICD-10-CM

## 2018-05-30 DIAGNOSIS — Z6841 Body Mass Index (BMI) 40.0 and over, adult: Secondary | ICD-10-CM

## 2018-05-30 DIAGNOSIS — E66813 Obesity, class 3: Secondary | ICD-10-CM

## 2018-05-30 DIAGNOSIS — E785 Hyperlipidemia, unspecified: Secondary | ICD-10-CM

## 2018-05-30 MED ORDER — METFORMIN HCL 500 MG PO TABS: 500.0000 mg | ORAL_TABLET | Freq: Every day | ORAL | 3 refills | Status: DC

## 2018-05-30 MED ORDER — AMLODIPINE BESYLATE 10 MG PO TABS: 10.0000 mg | ORAL_TABLET | Freq: Every day | ORAL | 6 refills | Status: DC

## 2018-05-30 MED ORDER — ATORVASTATIN CALCIUM 10 MG PO TABS: 10.0000 mg | ORAL_TABLET | Freq: Every day | ORAL | 3 refills | Status: DC

## 2018-05-30 NOTE — Progress Notes (Signed)
Virtual Visit via Telephone Note Due to current restrictions/limitations of in-office visits due to the COVID-19 pandemic, this scheduled clinical appointment was converted to a telehealth visit  I connected with Lonnie Brown on 05/30/18 at 4:12 p.m EDT by telephone and verified that I am speaking with the correct person using two identifiers. I am in my office.  The patient is at home.  Only the patient and myself participated in this encounter.  I discussed the limitations, risks, security and privacy concerns of performing an evaluation and management service by telephone and the availability of in person appointments. I also discussed with the patient that there may be a patient responsible charge related to this service. The patient expressed understanding and agreed to proceed.  History of Present Illness: History of HTN, CKDstage 3, obesity, HL, pre-DM, ED.  Last seen 02/2018.  Purpose of this visit is follow-up for chronic disease management.  HTN/CKD 3: Informed patient that his wife had called back in March and informed us that he was not compliant with taking his medications and had not had refills in quite a while.  Patient states that he is aware of this and since then he has been more compliant with taking medications.   He had seen the clinical pharmacist back in March and blood pressure was not at goal.  HCTZ was added to Norvasc but that was subsequently discontinued after it was found that he was not taking medications consistently.  He was left on just Norvasc.   -Reports compliance with Norvasc. Limits salt in foods No CP/SOB/PND Notice small swelling around top of work boots when he takes it off eGFR slightly lower on last chem.  Passing urine okay.  No hematuria  HL: reports compliance with and tolerating Lipitor.  No Myalgias, no muscle cramps  Pre-DM/Obesity: taking metformin as prescribed. Admits that he is not eating as much fast foods as before but still indulges  with [employer] -does a little walk "but I can stand to do more." - no scale to wgh self.  Last BMI in the 40s   HM:  Still has not used FIT  Outpatient Encounter Medications as of 05/30/2018  Medication Sig  . amLODipine (NORVASC) 10 MG tablet Take 1 tablet (10 mg total) by mouth daily.  Marland Kitchen atorvastatin (LIPITOR) 10 MG tablet Take 1 tablet (10 mg total) by mouth daily.  . clotrimazole (LOTRIMIN) 1 % cream Apply to both feet and between toes twice daily for 4 weeks (Patient taking differently: Apply 1 application topically daily as needed (for rash feet). Apply to both feet and between toes twice daily for 4 weeks)  . cyclobenzaprine (FLEXERIL) 10 MG tablet Take 1 tablet (10 mg total) by mouth 2 (two) times daily as needed for muscle spasms. (Patient not taking: Reported on 01/06/2018)  . divalproex (DEPAKOTE ER) 500 MG 24 hr tablet Take 500 mg by mouth daily.  . metFORMIN (GLUCOPHAGE) 500 MG tablet TAKE 1 TABLET BY MOUTH DAILY WITH BREAKFAST. (Patient taking differently: Take 500 mg by mouth daily with breakfast. )  . sildenafil (VIAGRA) 50 MG tablet 1 tab half hour to 1 hour before sexual intercourse.  Limit to 1 tablet per 24-hour.   No facility-administered encounter medications on file as of 05/30/2018.    Social History   Socioeconomic History  . Marital status: Married    Spouse name: Not on file  . Number of children: Not on file  . Years of education: Not on file  . Highest  education level: Not on file  Occupational History  . Not on file  Social Needs  . Financial resource strain: Not on file  . Food insecurity:    Worry: Not on file    Inability: Not on file  . Transportation needs:    Medical: Not on file    Non-medical: Not on file  Tobacco Use  . Smoking status: Never Smoker  . Smokeless tobacco: Never Used  Substance and Sexual Activity  . Alcohol use: No  . Drug use: No  . Sexual activity: Not on file  Lifestyle  . Physical activity:    Days per week: Not on  file    Minutes per session: Not on file  . Stress: Not on file  Relationships  . Social connections:    Talks on phone: Not on file    Gets together: Not on file    Attends religious service: Not on file    Active member of club or organization: Not on file    Attends meetings of clubs or organizations: Not on file    Relationship status: Not on file  . Intimate partner violence:    Fear of current or ex partner: Not on file    Emotionally abused: Not on file    Physically abused: Not on file    Forced sexual activity: Not on file  Other Topics Concern  . Not on file  Social History Narrative  . Not on file    Observations/Objective: Results for orders placed or performed in visit on 63/78/58  Basic Metabolic Panel  Result Value Ref Range   Glucose 132 (H) 65 - 99 mg/dL   BUN 18 6 - 24 mg/dL   Creatinine, Ser 1.55 (H) 0.76 - 1.27 mg/dL   GFR calc non Af Amer 48 (L) >59 mL/min/1.73   GFR calc Af Amer 56 (L) >59 mL/min/1.73   BUN/Creatinine Ratio 12 9 - 20   Sodium 141 134 - 144 mmol/L   Potassium 4.5 3.5 - 5.2 mmol/L   Chloride 101 96 - 106 mmol/L   CO2 25 20 - 29 mmol/L   Calcium 9.7 8.7 - 10.2 mg/dL  POCT glucose (manual entry)  Result Value Ref Range   POC Glucose 174 (A) 70 - 99 mg/dl  POCT glycosylated hemoglobin (Hb A1C)  Result Value Ref Range   Hemoglobin A1C     HbA1c POC (<> result, manual entry)     HbA1c, POC (prediabetic range)     HbA1c, POC (controlled diabetic range) 6.7 0.0 - 7.0 %   Lab Results  Component Value Date   CHOL 146 08/27/2017   HDL 30 (L) 08/27/2017   LDLCALC 85 08/27/2017   TRIG 153 (H) 08/27/2017   CHOLHDL 4.9 08/27/2017   Lab Results  Component Value Date   WBC 7.1 08/27/2017   HGB 13.7 08/27/2017   HCT 40.7 08/27/2017   MCV 86 08/27/2017   PLT 306 08/27/2017     Assessment and Plan: 1. Essential hypertension Level of control unknown. Encourage compliance. Follow-up with clinical pharmacist in 2 weeks for repeat  blood pressure check - amLODipine (NORVASC) 10 MG tablet; Take 1 tablet (10 mg total) by mouth daily.  Dispense: 30 tablet; Refill: 6  2. Prediabetes Dietary counseling given.  We discussed healthier choices even when he has to eat on the go like [employer] sandwiches on wheat bread Encouraged him to be more active.  Encouraged him to set a goal of walking if only for  10 minutes every day - metFORMIN (GLUCOPHAGE) 500 MG tablet; Take 1 tablet (500 mg total) by mouth daily with breakfast.  Dispense: 90 tablet; Refill: 3  3. Class 3 severe obesity due to excess calories with serious comorbidity and body mass index (BMI) of 40.0 to 44.9 in adult [facility]) See #2 above  4. Hyperlipidemia, unspecified hyperlipidemia type - atorvastatin (LIPITOR) 10 MG tablet; Take 1 tablet (10 mg total) by mouth daily.  Dispense: 90 tablet; Refill: 3  5. Screening for colon cancer Encouraged him to complete the fit test and mail it in  6. CKD (chronic kidney disease) stage 3, GFR 30-59 ml/min (HCC) Still at stage III.  Discussed the importance of good blood pressure and blood sugar control   Follow Up Instructions: Follow-up with me again in 3 months   I discussed the assessment and treatment plan with the patient. The patient was provided an opportunity to ask questions and all were answered. The patient agreed with the plan and demonstrated an understanding of the instructions.   The patient was advised to call back or seek an in-person evaluation if the symptoms worsen or if the condition fails to improve as anticipated.  I provided 13  minutes of non-face-to-face time during this encounter.   Karle Plumber, MD

## 2018-05-31 MED FILL — ?AMLODIPINE BESYLATE 10 MG: 10 | 30 days supply | Qty: 30 | Fill #0

## 2018-05-31 MED FILL — ?ATORVASTATIN 10 MG TABLET: 10 | 30 days supply | Qty: 30 | Fill #0

## 2018-05-31 MED FILL — ?METFORMIN HCL 500MG TABLET: 500 | 30 days supply | Qty: 30 | Fill #0

## 2018-06-13 ENCOUNTER — Encounter: Payer: Self-pay | Admitting: Pharmacist

## 2018-08-05 MED FILL — AMLODIPINE BESYLATE 10 MG T: 10 | 30 days supply | Qty: 30 | Fill #4

## 2018-08-05 MED FILL — ATORVASTATIN 10 MG TABLET: 10 | 30 days supply | Qty: 30 | Fill #3

## 2018-08-05 MED FILL — metFORMIN HCL 500 MG TABS: 500 | 30 days supply | Qty: 30 | Fill #3

## 2018-09-02 ENCOUNTER — Other Ambulatory Visit: Payer: Self-pay

## 2018-09-02 ENCOUNTER — Ambulatory Visit: Payer: No Typology Code available for payment source | Attending: Internal Medicine | Admitting: Internal Medicine

## 2018-09-02 ENCOUNTER — Encounter: Payer: Self-pay | Admitting: Internal Medicine

## 2018-09-02 VITALS — BP 153/94 | HR 82 | Temp 98.3°F | Resp 16 | Wt 327.8 lb

## 2018-09-02 DIAGNOSIS — M545 Low back pain, unspecified: Secondary | ICD-10-CM

## 2018-09-02 DIAGNOSIS — E119 Type 2 diabetes mellitus without complications: Secondary | ICD-10-CM

## 2018-09-02 DIAGNOSIS — E782 Mixed hyperlipidemia: Secondary | ICD-10-CM

## 2018-09-02 DIAGNOSIS — Z1211 Encounter for screening for malignant neoplasm of colon: Secondary | ICD-10-CM

## 2018-09-02 DIAGNOSIS — B353 Tinea pedis: Secondary | ICD-10-CM

## 2018-09-02 DIAGNOSIS — I10 Essential (primary) hypertension: Secondary | ICD-10-CM

## 2018-09-02 DIAGNOSIS — E1165 Type 2 diabetes mellitus with hyperglycemia: Secondary | ICD-10-CM

## 2018-09-02 DIAGNOSIS — Z23 Encounter for immunization: Secondary | ICD-10-CM

## 2018-09-02 DIAGNOSIS — G8929 Other chronic pain: Secondary | ICD-10-CM

## 2018-09-02 DIAGNOSIS — IMO0001 Reserved for inherently not codable concepts without codable children: Secondary | ICD-10-CM

## 2018-09-02 LAB — POCT GLYCOSYLATED HEMOGLOBIN (HGB A1C): HbA1c, POC (controlled diabetic range): 7.2 % — AB (ref 0.0–7.0)

## 2018-09-02 LAB — GLUCOSE, POCT (MANUAL RESULT ENTRY): POC Glucose: 148 mg/dl — AB (ref 70–99)

## 2018-09-02 MED ORDER — METFORMIN HCL 500 MG PO TABS: 1000.0000 mg | ORAL_TABLET | Freq: Every day | ORAL | 3 refills | Status: DC

## 2018-09-02 MED ORDER — MICONAZOLE NITRATE 2 % EX CREA: TOPICAL_CREAM | CUTANEOUS | 0 refills | Status: DC

## 2018-09-02 MED ORDER — HYDROCHLOROTHIAZIDE 12.5 MG PO TABS: 12.5000 mg | ORAL_TABLET | Freq: Every day | ORAL | 3 refills | Status: DC

## 2018-09-02 MED FILL — ?METFORMIN HCL 500MG TABLET: 500 | 30 days supply | Qty: 60 | Fill #0

## 2018-09-02 MED FILL — HYDROCHLOROTHIAZIDE 12.5 MG: 12.5 | 30 days supply | Qty: 30 | Fill #0

## 2018-09-02 NOTE — Progress Notes (Signed)
Patient ID: Lonnie Brown, male    DOB: 09-06-1960  MRN: [id number]  CC: Diabetes and Hypertension   Subjective: Lonnie Brown is a 58 y.o. male who presents for chronic ds manangement His concerns today include:  History of HTN, CKDstage 3, obesity, HL, DM, ED  HYPERTENSION Currently taking: see medication list -Norvasc Med Adherence: [x]  Yes -he tells me that he has been very consistent with taking the Norvasc every day Medication side effects: []  Yes    [x]  No Adherence with salt restriction: [x]  Yes    []  No Home Monitoring?: []  Yes    [x]  No Monitoring Frequency: []  Yes    []  No Home BP results range: []  Yes    []  No SOB? []  Yes    [x]  No Chest Pain?: []  Yes    [x]  No Leg swelling?: []  Yes    [x]  No Headaches?: []  Yes    [x]  No Dizziness? []  Yes    [x]  No Comments:  While driving several days ago he had pain in RT arm that went across to LT arm.  No CP, neck pain or numbness.  He has not had any recurrent episodes.  Denies any chest pains or pain in the arms with exertion.  DIABETES TYPE 2 Last A1C:   Results for orders placed or performed in visit on 09/02/18  POCT glucose (manual entry)  Result Value Ref Range   POC Glucose 148 (A) 70 - 99 mg/dl  POCT glycosylated hemoglobin (Hb A1C)  Result Value Ref Range   Hemoglobin A1C     HbA1c POC (<> result, manual entry)     HbA1c, POC (prediabetic range)     HbA1c, POC (controlled diabetic range) 7.2 (A) 0.0 - 7.0 %    Med Adherence:  [x]  Yes    []  No Medication side effects:  [x]  Yes    []  No Home Monitoring?  []  Yes    [x]  No Home glucose results range: Diet Adherence: []  Yes    [x]  No - Gained 14 lbs since 02/2018.  Admits that he eats a lot of pasta and sweets.  He does not like fruits; states he gets sick on the stomach when he eats most fruits.   Exercise: "not like I need to."   Reports aching in lower back when he walks more than 10 minutes.  This is been going on for over 1 yr.  No radiation down legs.   No pain with sittng or standing.  No morning stiffness.  Does not do a lot of heavy lifting Hypoglycemic episodes?: []  Yes    []  No Numbness of the feet? []  Yes    [x]  No Retinopathy hx? []  Yes    []  No Last eye exam: no blurred  Vision.  Overdue for eye exam but does not have insurance. Comments:  .  HL:  Taking Lipitor consistently per his report  HM:  Still has not turn in FIT test.  He tells me that he has not made up his mind whether he will do it or not.  Patient Active Problem List   Diagnosis Date Noted  . Diabetes mellitus without complication (HCC) 03/01/2018  . Erectile dysfunction 03/01/2018  . Noncompliance with medications 03/01/2018  . CKD (chronic kidney disease) stage 3, GFR 30-59 ml/min (HCC) 08/27/2017  . Hyperlipidemia 09/25/2016  . Obesity 07/20/2015  . Achilles tendinitis 04/09/2015  . Hypertension 03/04/2015     Current Outpatient Medications on File Prior to Visit  Medication Sig Dispense Refill  . amLODipine (NORVASC) 10 MG tablet Take 1 tablet (10 mg total) by mouth daily. 30 tablet 6  . atorvastatin (LIPITOR) 10 MG tablet Take 1 tablet (10 mg total) by mouth daily. 90 tablet 3  . clotrimazole (LOTRIMIN) 1 % cream Apply to both feet and between toes twice daily for 4 weeks (Patient taking differently: Apply 1 application topically daily as needed (for rash feet). Apply to both feet and between toes twice daily for 4 weeks) 60 g 1  . sildenafil (VIAGRA) 50 MG tablet 1 tab half hour to 1 hour before sexual intercourse.  Limit to 1 tablet per 24-hour. 10 tablet 5   No current facility-administered medications on file prior to visit.     Allergies  Allergen Reactions  . Lisinopril Other (See Comments)    Angioedema  . Banana Nausea And Vomiting    Social History   Socioeconomic History  . Marital status: Married    Spouse name: Not on file  . Number of children: Not on file  . Years of education: Not on file  . Highest education level: Not on file   Occupational History  . Not on file  Social Needs  . Financial resource strain: Not on file  . Food insecurity    Worry: Not on file    Inability: Not on file  . Transportation needs    Medical: Not on file    Non-medical: Not on file  Tobacco Use  . Smoking status: Never Smoker  . Smokeless tobacco: Never Used  Substance and Sexual Activity  . Alcohol use: No  . Drug use: No  . Sexual activity: Not on file  Lifestyle  . Physical activity    Days per week: Not on file    Minutes per session: Not on file  . Stress: Not on file  Relationships  . Social Musicianconnections    Talks on phone: Not on file    Gets together: Not on file    Attends religious service: Not on file    Active member of club or organization: Not on file    Attends meetings of clubs or organizations: Not on file    Relationship status: Not on file  . Intimate partner violence    Fear of current or ex partner: Not on file    Emotionally abused: Not on file    Physically abused: Not on file    Forced sexual activity: Not on file  Other Topics Concern  . Not on file  Social History Narrative  . Not on file    Family History  Problem Relation Age of Onset  . Diabetes Mother   . Hypertension Mother   . Alcoholism Father     Past Surgical History:  Procedure Laterality Date  . EYE SURGERY    . TONSILLECTOMY      ROS: Review of Systems Negative except as stated above  PHYSICAL EXAM: BP (!) 153/94   Pulse 82   Temp 98.3 F (36.8 C) (Oral)   Resp 16   Wt (!) 327 lb 12.8 oz (148.7 kg)   SpO2 96%   BMI 45.72 kg/m   Wt Readings from Last 3 Encounters:  09/02/18 (!) 327 lb 12.8 oz (148.7 kg)  02/28/18 (!) 314 lb 3.2 oz (142.5 kg)  11/27/17 (!) 316 lb 12.8 oz (143.7 kg)   Physical Exam General appearance - alert, well appearing, older African-American male and in no distress Mental status - normal  mood, behavior, speech, dress, motor activity, and thought processes Mouth - mucous membranes  moist, pharynx normal without lesions Neck - supple, no significant adenopathy Chest - clear to auscultation, no wheezes, rales or rhonchi, symmetric air entry Heart - normal rate, regular rhythm, normal S1, S2, no murmurs, rubs, clicks or gallops Extremities -trace bilateral lower extremity edema  CMP Latest Ref Rng & Units 02/28/2018 08/27/2017 01/27/2017  Glucose 65 - 99 mg/dL 811(B132(H) 92 147(W125(H)  BUN 6 - 24 mg/dL 18 17 14   Creatinine 0.76 - 1.27 mg/dL 2.95(A1.55(H) 2.13(Y1.45(H) 8.65(H1.48(H)  Sodium 134 - 144 mmol/L 141 140 135  Potassium 3.5 - 5.2 mmol/L 4.5 4.6 4.0  Chloride 96 - 106 mmol/L 101 104 104  CO2 20 - 29 mmol/L 25 21 22   Calcium 8.7 - 10.2 mg/dL 9.7 84.610.0 9.2  Total Protein 6.0 - 8.5 g/dL - 7.8 -  Total Bilirubin 0.0 - 1.2 mg/dL - 0.7 -  Alkaline Phos 39 - 117 IU/L - 92 -  AST 0 - 40 IU/L - 36 -  ALT 0 - 44 IU/L - 54(H) -   Lipid Panel     Component Value Date/Time   CHOL 146 08/27/2017 1644   TRIG 153 (H) 08/27/2017 1644   HDL 30 (L) 08/27/2017 1644   CHOLHDL 4.9 08/27/2017 1644   LDLCALC 85 08/27/2017 1644    CBC    Component Value Date/Time   WBC 7.1 08/27/2017 1644   WBC 4.7 01/27/2017 1321   RBC 4.71 08/27/2017 1644   RBC 4.58 01/27/2017 1321   HGB 13.7 08/27/2017 1644   HCT 40.7 08/27/2017 1644   PLT 306 08/27/2017 1644   MCV 86 08/27/2017 1644   MCH 29.1 08/27/2017 1644   MCH 29.5 01/27/2017 1321   MCHC 33.7 08/27/2017 1644   MCHC 33.6 01/27/2017 1321   RDW 14.4 08/27/2017 1644   LYMPHSABS 2.7 03/13/2016 0237   MONOABS 0.5 03/13/2016 0237   EOSABS 0.2 03/13/2016 0237   BASOSABS 0.0 03/13/2016 0237    ASSESSMENT AND PLAN: 1. Uncontrolled type 2 diabetes mellitus without complication, without long-term current use of insulin (HCC) Advised patient that his A1c has been creeping up and we went over the significance of this.  Dietary counseling given.  Increase metformin 1000 mg daily. Encouraged him to do 5-minute walk rest cycles - POCT glucose (manual entry) -  POCT glycosylated hemoglobin (Hb A1C) - Microalbumin / creatinine urine ratio - metFORMIN (GLUCOPHAGE) 500 MG tablet; Take 2 tablets (1,000 mg total) by mouth daily with breakfast.  Dispense: 180 tablet; Refill: 3 - CBC - Comprehensive metabolic panel - Lipid panel  2. Morbid obesity (HCC) See #1 above  3. Essential hypertension Not at goal.  Continue amlodipine.  Add low-dose of hydrochlorothiazide.  Advised to return to the lab after he is been on the medication for 1 week to have potassium level checked - hydrochlorothiazide (HYDRODIURIL) 12.5 MG tablet; Take 1 tablet (12.5 mg total) by mouth daily.  Dispense: 90 tablet; Refill: 3  4. Mixed hyperlipidemia Continue Lipitor.  5. Tinea pedis of both feet Good diabetic foot care discussed and encouraged. Encourage patient to keep the feet clean and dry. - miconazole (MICOTIN) 2 % cream; Apply to affected area BID  Dispense: 35 g; Refill: 0  6. Chronic midline low back pain without sciatica Strongly encouraged weight loss through change in eating habits and trying to move more.  Recommend 5 minutes walk rest cycles -We will get some baseline imaging study. Use  Tylenol over-the-counter as needed - DG Lumbar Spine Complete; Future  7. Need for influenza vaccination Given  8. Colon cancer screening Discussed the importance of colon cancer screening.  Encouraged him to turn in the fit test if he intends to do so.    Patient was given the opportunity to ask questions.  Patient verbalized understanding of the plan and was able to repeat key elements of the plan.   Orders Placed This Encounter  Procedures  . DG Lumbar Spine Complete  . Flu Vaccine QUAD 6+ mos PF IM (Fluarix Quad PF)  . Microalbumin / creatinine urine ratio  . CBC  . Comprehensive metabolic panel  . Lipid panel  . POCT glucose (manual entry)  . POCT glycosylated hemoglobin (Hb A1C)     Requested Prescriptions   Signed Prescriptions Disp Refills  .  metFORMIN (GLUCOPHAGE) 500 MG tablet 180 tablet 3    Sig: Take 2 tablets (1,000 mg total) by mouth daily with breakfast.  . hydrochlorothiazide (HYDRODIURIL) 12.5 MG tablet 90 tablet 3    Sig: Take 1 tablet (12.5 mg total) by mouth daily.  . miconazole (MICOTIN) 2 % cream 35 g 0    Sig: Apply to affected area BID    Return in about 3 months (around 12/03/2018).  Karle Plumber, MD, FACP

## 2018-09-02 NOTE — Patient Instructions (Addendum)
Your blood pressure is not at goal.  We have added a new blood pressure medication called hydrochlorothiazide that she will take once a day.  Please return to the laboratory to have a potassium level checked after you have been on this medication for about 1 week.  Your diabetes is not at goal.  We have increased the metformin to two 500 mg tablets daily. Please try to get an eye exam done at least once a year with you being diabetic.  I have ordered the x-rays of your lower back.  [facility] to have [facility] done.  You can use Tylenol over-the-counter as needed for back pain.   Influenza Virus Vaccine injection (Fluarix) What is this medicine? INFLUENZA VIRUS VACCINE (in floo EN zuh VAHY ruhs vak SEEN) helps to reduce the risk of getting influenza also known as the flu. This medicine may be used for other purposes; ask your health care provider or pharmacist if you have questions. COMMON BRAND NAME(S): Fluarix, Fluzone What should I tell my health care provider before I take this medicine? They need to know if you have any of these conditions:  bleeding disorder like hemophilia  fever or infection  Guillain-Barre syndrome or other neurological problems  immune system problems  infection with the human immunodeficiency virus (HIV) or AIDS  low blood platelet counts  multiple sclerosis  an unusual or allergic reaction to influenza virus vaccine, eggs, chicken proteins, latex, gentamicin, other medicines, foods, dyes or preservatives  pregnant or trying to get pregnant  breast-feeding How should I use this medicine? This vaccine is for injection into a muscle. It is given by a health care professional. A copy of Vaccine Information Statements will be given before each vaccination. Read this sheet carefully each time. The sheet may change frequently. Talk to your pediatrician regarding the use of this medicine in children. Special  care may be needed. Overdosage: If you think you have taken too much of this medicine contact a poison control center or emergency room at once. NOTE: This medicine is only for you. Do not share this medicine with others. What if I miss a dose? This does not apply. What may interact with this medicine?  chemotherapy or radiation therapy  medicines that lower your immune system like etanercept, anakinra, infliximab, and adalimumab  medicines that treat or prevent blood clots like warfarin  phenytoin  steroid medicines like prednisone or cortisone  theophylline  vaccines This list may not describe all possible interactions. Give your health care provider a list of all the medicines, herbs, non-prescription drugs, or dietary supplements you use. Also tell them if you smoke, drink alcohol, or use illegal drugs. Some items may interact with your medicine. What should I watch for while using this medicine? Report any side effects that do not go away within 3 days to your doctor or health care professional. Call your health care provider if any unusual symptoms occur within 6 weeks of receiving this vaccine. You may still catch the flu, but the illness is not usually as bad. You cannot get the flu from the vaccine. The vaccine will not protect against colds or other illnesses that may cause fever. The vaccine is needed every year. What side effects may I notice from receiving this medicine? Side effects that you should report to your doctor or health care professional as soon as possible:  allergic reactions like skin rash, itching or hives, swelling of  the face, lips, or tongue Side effects that usually do not require medical attention (report to your doctor or health care professional if they continue or are bothersome):  fever  headache  muscle aches and pains  pain, tenderness, redness, or swelling at site where injected  weak or tired This list may not describe all possible side  effects. Call your doctor for medical advice about side effects. You may report side effects to FDA at 1-800-FDA-1088. Where should I keep my medicine? This vaccine is only given in a clinic, pharmacy, doctor's office, or other health care setting and will not be stored at home. NOTE: This sheet is a summary. It may not cover all possible information. If you have questions about this medicine, talk to your doctor, pharmacist, or health care provider.  2020 Elsevier/Gold Standard (2007-07-24 09:30:40)

## 2018-09-03 LAB — COMPREHENSIVE METABOLIC PANEL
ALT: 39 IU/L (ref 0–44)
AST: 22 IU/L (ref 0–40)
Albumin/Globulin Ratio: 1.2 (ref 1.2–2.2)
Albumin: 4.2 g/dL (ref 3.8–4.9)
Alkaline Phosphatase: 101 IU/L (ref 39–117)
BUN/Creatinine Ratio: 9 (ref 9–20)
BUN: 14 mg/dL (ref 6–24)
Bilirubin Total: 0.3 mg/dL (ref 0.0–1.2)
CO2: 25 mmol/L (ref 20–29)
Calcium: 9.6 mg/dL (ref 8.7–10.2)
Chloride: 104 mmol/L (ref 96–106)
Creatinine, Ser: 1.52 mg/dL — ABNORMAL HIGH (ref 0.76–1.27)
GFR calc Af Amer: 57 mL/min/{1.73_m2} — ABNORMAL LOW (ref >59)
GFR calc non Af Amer: 49 mL/min/{1.73_m2} — ABNORMAL LOW (ref >59)
Globulin, Total: 3.6 g/dL (ref 1.5–4.5)
Glucose: 105 mg/dL — ABNORMAL HIGH (ref 65–99)
Potassium: 4.5 mmol/L (ref 3.5–5.2)
Sodium: 141 mmol/L (ref 134–144)
Total Protein: 7.8 g/dL (ref 6.0–8.5)

## 2018-09-03 LAB — LIPID PANEL
Chol/HDL Ratio: 4.1 ratio (ref 0.0–5.0)
Cholesterol, Total: 131 mg/dL (ref 100–199)
HDL: 32 mg/dL — ABNORMAL LOW (ref >39)
LDL Calculated: 73 mg/dL (ref 0–99)
Triglycerides: 131 mg/dL (ref 0–149)
VLDL Cholesterol Cal: 26 mg/dL (ref 5–40)

## 2018-09-03 LAB — MICROALBUMIN / CREATININE URINE RATIO
Creatinine, Urine: 196.7 mg/dL
Microalb/Creat Ratio: 32 mg/g creat — ABNORMAL HIGH (ref 0–29)
Microalbumin, Urine: 62.6 ug/mL

## 2018-09-03 LAB — CBC
Hematocrit: 39.6 % (ref 37.5–51.0)
Hemoglobin: 13.1 g/dL (ref 13.0–17.7)
MCH: 29.4 pg (ref 26.6–33.0)
MCHC: 33.1 g/dL (ref 31.5–35.7)
MCV: 89 fL (ref 79–97)
Platelets: 272 10*3/uL (ref 150–450)
RBC: 4.46 x10E6/uL (ref 4.14–5.80)
RDW: 13.7 % (ref 11.6–15.4)
WBC: 7.8 10*3/uL (ref 3.4–10.8)

## 2018-09-30 MED FILL — ?METFORMIN HCL 500MG TABLET: 500 | 30 days supply | Qty: 60 | Fill #0

## 2018-09-30 MED FILL — ?ATORVASTATIN 10 MG TABLET: 10 | 30 days supply | Qty: 30 | Fill #1

## 2018-11-28 MED FILL — ?ATORVASTATIN 10 MG TABLET: 10 | 30 days supply | Qty: 30 | Fill #2

## 2018-11-28 MED FILL — HYDROCHLOROTHIAZIDE 12.5 MG: 12.5 | 30 days supply | Qty: 30 | Fill #0

## 2018-11-28 MED FILL — ?METFORMIN HCL 500MG TABLET: 500 | 30 days supply | Qty: 60 | Fill #1

## 2018-12-02 ENCOUNTER — Other Ambulatory Visit: Payer: Self-pay

## 2018-12-02 ENCOUNTER — Encounter: Payer: Self-pay | Admitting: Internal Medicine

## 2018-12-02 ENCOUNTER — Ambulatory Visit: Payer: Self-pay | Attending: Internal Medicine | Admitting: Internal Medicine

## 2018-12-02 DIAGNOSIS — Z125 Encounter for screening for malignant neoplasm of prostate: Secondary | ICD-10-CM

## 2018-12-02 DIAGNOSIS — M25562 Pain in left knee: Secondary | ICD-10-CM

## 2018-12-02 DIAGNOSIS — E785 Hyperlipidemia, unspecified: Secondary | ICD-10-CM

## 2018-12-02 DIAGNOSIS — N1831 Chronic kidney disease, stage 3a: Secondary | ICD-10-CM

## 2018-12-02 DIAGNOSIS — E119 Type 2 diabetes mellitus without complications: Secondary | ICD-10-CM

## 2018-12-02 DIAGNOSIS — I10 Essential (primary) hypertension: Secondary | ICD-10-CM

## 2018-12-02 DIAGNOSIS — E1169 Type 2 diabetes mellitus with other specified complication: Secondary | ICD-10-CM

## 2018-12-02 NOTE — Progress Notes (Signed)
Virtual Visit via Telephone Note Due to current restrictions/limitations of in-office visits due to the COVID-19 pandemic, this scheduled clinical appointment was converted to a telehealth visit  I connected with Lonnie Brown on 12/02/18 at 4:45 p.m by telephone and verified that I am speaking with the correct person using two identifiers. I am in my office.  The patient is at home.  Only the patient and myself participated in this encounter.  I discussed the limitations, risks, security and privacy concerns of performing an evaluation and management service by telephone and the availability of in person appointments. I also discussed with the patient that there may be a patient responsible charge related to this service. The patient expressed understanding and agreed to proceed.   History of Present Illness: History of HTN, CKDstage 3, obesity, HL, DM, ED.  Last seen 08/2018.  Today's visit is for chronic ds management  HTN:  HCTZ added on last visit but he was not able to get it as yet due to limited finances.  Plan to pick up everything tomorrow. Compliant with Norvasc and salt restriction -no CP/SOB/LE edema  Some pain in LT knee since yesterday.  No swelling or redness No trauma or falls.  Not taking anything for pain  DM:  Does not check BS.  Last A1C was 7.2.  We increase Metformin to 1 gram daily Eating habits:  "I still like the paste but I've done better on the sweets." Exercise:  Not getting in much exercise Has not done eye exam.  Unable   HL:  Compliant and tolerating Lipitor.  No muscle cramps and aches  HM: still has fit test.  Not sure if he will do it Outpatient Encounter Medications as of 12/02/2018  Medication Sig  . amLODipine (NORVASC) 10 MG tablet Take 1 tablet (10 mg total) by mouth daily.  Marland Kitchen atorvastatin (LIPITOR) 10 MG tablet Take 1 tablet (10 mg total) by mouth daily.  . clotrimazole (LOTRIMIN) 1 % cream Apply to both feet and between toes twice daily for 4  weeks (Patient taking differently: Apply 1 application topically daily as needed (for rash feet). Apply to both feet and between toes twice daily for 4 weeks)  . hydrochlorothiazide (HYDRODIURIL) 12.5 MG tablet Take 1 tablet (12.5 mg total) by mouth daily.  . metFORMIN (GLUCOPHAGE) 500 MG tablet Take 2 tablets (1,000 mg total) by mouth daily with breakfast.  . miconazole (MICOTIN) 2 % cream Apply to affected area BID  . sildenafil (VIAGRA) 50 MG tablet 1 tab half hour to 1 hour before sexual intercourse.  Limit to 1 tablet per 24-hour.   No facility-administered encounter medications on file as of 12/02/2018.     Observations/Objective:  Results for orders placed or performed in visit on 09/02/18  Microalbumin / creatinine urine ratio  Result Value Ref Range   Creatinine, Urine 196.7 Not Estab. mg/dL   Microalbumin, Urine 62.6 Not Estab. ug/mL   Microalb/Creat Ratio 32 (H) 0 - 29 mg/g creat  CBC  Result Value Ref Range   WBC 7.8 3.4 - 10.8 x10E3/uL   RBC 4.46 4.14 - 5.80 x10E6/uL   Hemoglobin 13.1 13.0 - 17.7 g/dL   Hematocrit 39.6 37.5 - 51.0 %   MCV 89 79 - 97 fL   MCH 29.4 26.6 - 33.0 pg   MCHC 33.1 31.5 - 35.7 g/dL   RDW 13.7 11.6 - 15.4 %   Platelets 272 150 - 450 x10E3/uL  Comprehensive metabolic panel  Result Value Ref Range  Glucose 105 (H) 65 - 99 mg/dL   BUN 14 6 - 24 mg/dL   Creatinine, Ser 3.41 (H) 0.76 - 1.27 mg/dL   GFR calc non Af Amer 49 (L) >59 mL/min/1.73   GFR calc Af Amer 57 (L) >59 mL/min/1.73   BUN/Creatinine Ratio 9 9 - 20   Sodium 141 134 - 144 mmol/L   Potassium 4.5 3.5 - 5.2 mmol/L   Chloride 104 96 - 106 mmol/L   CO2 25 20 - 29 mmol/L   Calcium 9.6 8.7 - 10.2 mg/dL   Total Protein 7.8 6.0 - 8.5 g/dL   Albumin 4.2 3.8 - 4.9 g/dL   Globulin, Total 3.6 1.5 - 4.5 g/dL   Albumin/Globulin Ratio 1.2 1.2 - 2.2   Bilirubin Total 0.3 0.0 - 1.2 mg/dL   Alkaline Phosphatase 101 39 - 117 IU/L   AST 22 0 - 40 IU/L   ALT 39 0 - 44 IU/L  Lipid panel   Result Value Ref Range   Cholesterol, Total 131 100 - 199 mg/dL   Triglycerides 937 0 - 149 mg/dL   HDL 32 (L) >90 mg/dL   VLDL Cholesterol Cal 26 5 - 40 mg/dL   LDL Calculated 73 0 - 99 mg/dL   Chol/HDL Ratio 4.1 0.0 - 5.0 ratio  POCT glucose (manual entry)  Result Value Ref Range   POC Glucose 148 (A) 70 - 99 mg/dl  POCT glycosylated hemoglobin (Hb A1C)  Result Value Ref Range   Hemoglobin A1C     HbA1c POC (<> result, manual entry)     HbA1c, POC (prediabetic range)     HbA1c, POC (controlled diabetic range) 7.2 (A) 0.0 - 7.0 %    Assessment and Plan: 1. Essential hypertension -level of control unknown Pt plans to pick up HCTZ tomorrow. Cont Norvasc  2. Controlled type 2 diabetes mellitus without complication, without long-term current use of insulin (HCC) Cont Metformin Discussed and encourage health eating habit Again encouraged him to get in some form of exercise if only for 10 minutes 3 of to 4 times a week. Encouraged to get eye exam done when he is able to afford. - Hemoglobin A1c; Future  3. Hyperlipidemia associated with type 2 diabetes mellitus (HCC) Continue Lipitor  4. Prostate cancer screening - PSA; Future  5. Stage 3a chronic kidney disease Stable.  Advised to avoid NSAIDs.  6.  Left knee pain Advised to use Tylenol as needed.  If no improvement in 1 to 2 weeks advised to follow-up in person.   Follow Up Instructions: Follow-up in 4 months   I discussed the assessment and treatment plan with the patient. The patient was provided an opportunity to ask questions and all were answered. The patient agreed with the plan and demonstrated an understanding of the instructions.   The patient was advised to call back or seek an in-person evaluation if the symptoms worsen or if the condition fails to improve as anticipated.  I provided 12 minutes of non-face-to-face time during this encounter.   Jonah Blue, MD

## 2018-12-03 ENCOUNTER — Ambulatory Visit: Payer: Self-pay | Admitting: Internal Medicine

## 2019-01-20 ENCOUNTER — Observation Stay (HOSPITAL_COMMUNITY): Payer: Self-pay

## 2019-01-20 ENCOUNTER — Emergency Department (HOSPITAL_COMMUNITY): Payer: Self-pay

## 2019-01-20 ENCOUNTER — Observation Stay (HOSPITAL_BASED_OUTPATIENT_CLINIC_OR_DEPARTMENT_OTHER): Payer: Self-pay

## 2019-01-20 ENCOUNTER — Encounter (HOSPITAL_COMMUNITY): Payer: Self-pay | Admitting: Emergency Medicine

## 2019-01-20 ENCOUNTER — Inpatient Hospital Stay (HOSPITAL_COMMUNITY)
Admission: EM | Admit: 2019-01-20 | Discharge: 2019-01-22 | DRG: 066 | Disposition: A | Discharge status: Home / Self Care | Payer: Self-pay | Attending: Internal Medicine | Admitting: Internal Medicine

## 2019-01-20 DIAGNOSIS — R200 Anesthesia of skin: Secondary | ICD-10-CM | POA: Diagnosis present

## 2019-01-20 DIAGNOSIS — I639 Cerebral infarction, unspecified: Secondary | ICD-10-CM | POA: Diagnosis present

## 2019-01-20 DIAGNOSIS — R531 Weakness: Secondary | ICD-10-CM | POA: Diagnosis present

## 2019-01-20 DIAGNOSIS — Z833 Family history of diabetes mellitus: Secondary | ICD-10-CM

## 2019-01-20 DIAGNOSIS — N183 Chronic kidney disease, stage 3 unspecified: Secondary | ICD-10-CM

## 2019-01-20 DIAGNOSIS — Z79899 Other long term (current) drug therapy: Secondary | ICD-10-CM

## 2019-01-20 DIAGNOSIS — Z8249 Family history of ischemic heart disease and other diseases of the circulatory system: Secondary | ICD-10-CM

## 2019-01-20 DIAGNOSIS — E785 Hyperlipidemia, unspecified: Secondary | ICD-10-CM | POA: Diagnosis present

## 2019-01-20 DIAGNOSIS — Z20822 Contact with and (suspected) exposure to covid-19: Secondary | ICD-10-CM | POA: Diagnosis present

## 2019-01-20 DIAGNOSIS — F40240 Claustrophobia: Secondary | ICD-10-CM | POA: Diagnosis present

## 2019-01-20 DIAGNOSIS — Z811 Family history of alcohol abuse and dependence: Secondary | ICD-10-CM

## 2019-01-20 DIAGNOSIS — I6381 Other cerebral infarction due to occlusion or stenosis of small artery: Principal | ICD-10-CM | POA: Diagnosis present

## 2019-01-20 DIAGNOSIS — N1831 Chronic kidney disease, stage 3a: Secondary | ICD-10-CM | POA: Diagnosis present

## 2019-01-20 DIAGNOSIS — E1122 Type 2 diabetes mellitus with diabetic chronic kidney disease: Secondary | ICD-10-CM | POA: Diagnosis present

## 2019-01-20 DIAGNOSIS — I129 Hypertensive chronic kidney disease with stage 1 through stage 4 chronic kidney disease, or unspecified chronic kidney disease: Secondary | ICD-10-CM | POA: Diagnosis present

## 2019-01-20 DIAGNOSIS — Z91018 Allergy to other foods: Secondary | ICD-10-CM

## 2019-01-20 DIAGNOSIS — Z7984 Long term (current) use of oral hypoglycemic drugs: Secondary | ICD-10-CM

## 2019-01-20 DIAGNOSIS — Z888 Allergy status to other drugs, medicaments and biological substances status: Secondary | ICD-10-CM

## 2019-01-20 DIAGNOSIS — I6389 Other cerebral infarction: Secondary | ICD-10-CM

## 2019-01-20 DIAGNOSIS — I6623 Occlusion and stenosis of bilateral posterior cerebral arteries: Secondary | ICD-10-CM | POA: Diagnosis present

## 2019-01-20 HISTORY — DX: Pure hypercholesterolemia, unspecified: E78.00

## 2019-01-20 HISTORY — DX: Type 2 diabetes mellitus without complications: E11.9

## 2019-01-20 LAB — URINALYSIS, ROUTINE W REFLEX MICROSCOPIC
Bilirubin Urine: NEGATIVE
Glucose, UA: NEGATIVE mg/dL
Hgb urine dipstick: NEGATIVE
Ketones, ur: NEGATIVE mg/dL
Leukocytes,Ua: NEGATIVE
Nitrite: NEGATIVE
Protein, ur: NEGATIVE mg/dL
Specific Gravity, Urine: 1.011 (ref 1.005–1.030)
pH: 5 (ref 5.0–8.0)

## 2019-01-20 LAB — DIFFERENTIAL
Abs Immature Granulocytes: 0.03 10*3/uL (ref 0.00–0.07)
Basophils Absolute: 0 10*3/uL (ref 0.0–0.1)
Basophils Relative: 0 %
Eosinophils Absolute: 0.1 10*3/uL (ref 0.0–0.5)
Eosinophils Relative: 2 %
Immature Granulocytes: 0 %
Lymphocytes Relative: 27 %
Lymphs Abs: 1.8 10*3/uL (ref 0.7–4.0)
Monocytes Absolute: 0.7 10*3/uL (ref 0.1–1.0)
Monocytes Relative: 10 %
Neutro Abs: 4.1 10*3/uL (ref 1.7–7.7)
Neutrophils Relative %: 61 %

## 2019-01-20 LAB — I-STAT CHEM 8, ED
BUN: 17 mg/dL (ref 6–20)
Calcium, Ion: 1.19 mmol/L (ref 1.15–1.40)
Chloride: 104 mmol/L (ref 98–111)
Creatinine, Ser: 1.4 mg/dL — ABNORMAL HIGH (ref 0.61–1.24)
Glucose, Bld: 128 mg/dL — ABNORMAL HIGH (ref 70–99)
HCT: 42 % (ref 39.0–52.0)
Hemoglobin: 14.3 g/dL (ref 13.0–17.0)
Potassium: 4.1 mmol/L (ref 3.5–5.1)
Sodium: 139 mmol/L (ref 135–145)
TCO2: 26 mmol/L (ref 22–32)

## 2019-01-20 LAB — ETHANOL: Alcohol, Ethyl (B): <10 mg/dL (ref <10)

## 2019-01-20 LAB — GLUCOSE, CAPILLARY
Glucose-Capillary: 131 mg/dL — ABNORMAL HIGH (ref 70–99)
Glucose-Capillary: 122 mg/dL — ABNORMAL HIGH (ref 70–99)

## 2019-01-20 LAB — LIPID PANEL
Cholesterol: 129 mg/dL (ref 0–200)
HDL: 30 mg/dL — ABNORMAL LOW (ref >40)
LDL Cholesterol: 85 mg/dL (ref 0–99)
Total CHOL/HDL Ratio: 4.3 RATIO
Triglycerides: 68 mg/dL (ref <150)
VLDL: 14 mg/dL (ref 0–40)

## 2019-01-20 LAB — RAPID URINE DRUG SCREEN, HOSP PERFORMED
Amphetamines: NOT DETECTED
Barbiturates: NOT DETECTED
Benzodiazepines: NOT DETECTED
Cocaine: NOT DETECTED
Opiates: NOT DETECTED
Tetrahydrocannabinol: NOT DETECTED

## 2019-01-20 LAB — COMPREHENSIVE METABOLIC PANEL
ALT: 36 U/L (ref 0–44)
AST: 23 U/L (ref 15–41)
Albumin: 3.5 g/dL (ref 3.5–5.0)
Alkaline Phosphatase: 76 U/L (ref 38–126)
Anion gap: 6 (ref 5–15)
BUN: 16 mg/dL (ref 6–20)
CO2: 27 mmol/L (ref 22–32)
Calcium: 9.2 mg/dL (ref 8.9–10.3)
Chloride: 105 mmol/L (ref 98–111)
Creatinine, Ser: 1.47 mg/dL — ABNORMAL HIGH (ref 0.61–1.24)
GFR calc Af Amer: 59 mL/min — ABNORMAL LOW (ref >60)
GFR calc non Af Amer: 51 mL/min — ABNORMAL LOW (ref >60)
Glucose, Bld: 137 mg/dL — ABNORMAL HIGH (ref 70–99)
Potassium: 4.1 mmol/L (ref 3.5–5.1)
Sodium: 138 mmol/L (ref 135–145)
Total Bilirubin: 0.8 mg/dL (ref 0.3–1.2)
Total Protein: 7.3 g/dL (ref 6.5–8.1)

## 2019-01-20 LAB — SARS CORONAVIRUS 2 (TAT 6-24 HRS): SARS Coronavirus 2: NEGATIVE

## 2019-01-20 LAB — HIV ANTIBODY (ROUTINE TESTING W REFLEX): HIV Screen 4th Generation wRfx: NONREACTIVE

## 2019-01-20 LAB — PROTIME-INR
INR: 1 (ref 0.8–1.2)
Prothrombin Time: 12.9 seconds (ref 11.4–15.2)

## 2019-01-20 LAB — CBC
HCT: 42.6 % (ref 39.0–52.0)
Hemoglobin: 13.4 g/dL (ref 13.0–17.0)
MCH: 28.3 pg (ref 26.0–34.0)
MCHC: 31.5 g/dL (ref 30.0–36.0)
MCV: 90.1 fL (ref 80.0–100.0)
Platelets: 251 10*3/uL (ref 150–400)
RBC: 4.73 MIL/uL (ref 4.22–5.81)
RDW: 13.3 % (ref 11.5–15.5)
WBC: 6.7 10*3/uL (ref 4.0–10.5)
nRBC: 0 % (ref 0.0–0.2)

## 2019-01-20 LAB — ECHOCARDIOGRAM COMPLETE

## 2019-01-20 LAB — APTT: aPTT: 25 seconds (ref 24–36)

## 2019-01-20 LAB — HEMOGLOBIN A1C
Hgb A1c MFr Bld: 7 % — ABNORMAL HIGH (ref 4.8–5.6)
Mean Plasma Glucose: 154.2 mg/dL

## 2019-01-20 MED ORDER — STROKE: EARLY STAGES OF RECOVERY BOOK
Freq: Once | Status: AC
  Filled 2019-01-20: qty 1

## 2019-01-20 MED ORDER — ASPIRIN 325 MG PO TABS
650.0000 mg | ORAL_TABLET | ORAL | Status: AC
  Administered 2019-01-20: 650 mg via ORAL
  Filled 2019-01-20: qty 2

## 2019-01-20 MED ORDER — INSULIN ASPART 100 UNIT/ML ~~LOC~~ SOLN
0.0000 [IU] | Freq: Three times a day (TID) | SUBCUTANEOUS | Status: DC
  Administered 2019-01-20 – 2019-01-22 (×4): 2 [IU] via SUBCUTANEOUS

## 2019-01-20 MED ORDER — ENOXAPARIN SODIUM 40 MG/0.4ML ~~LOC~~ SOLN
40.0000 mg | SUBCUTANEOUS | Status: DC
  Administered 2019-01-20 – 2019-01-22 (×3): 40 mg via SUBCUTANEOUS
  Filled 2019-01-20 (×3): qty 0.4

## 2019-01-20 MED ORDER — INSULIN ASPART 100 UNIT/ML ~~LOC~~ SOLN: 0.0000 [IU] | Freq: Every day | SUBCUTANEOUS | Status: DC

## 2019-01-20 MED ORDER — CLOPIDOGREL BISULFATE 75 MG PO TABS
75.0000 mg | ORAL_TABLET | Freq: Every day | ORAL | Status: DC
  Administered 2019-01-21 – 2019-01-22 (×2): 75 mg via ORAL
  Filled 2019-01-20 (×2): qty 1

## 2019-01-20 MED ORDER — ATORVASTATIN CALCIUM 80 MG PO TABS
80.0000 mg | ORAL_TABLET | Freq: Every day | ORAL | Status: DC
  Administered 2019-01-20 – 2019-01-21 (×2): 80 mg via ORAL
  Filled 2019-01-20 (×2): qty 1

## 2019-01-20 MED ORDER — ASPIRIN EC 81 MG PO TBEC
81.0000 mg | DELAYED_RELEASE_TABLET | Freq: Every day | ORAL | Status: DC
  Administered 2019-01-21 – 2019-01-22 (×2): 81 mg via ORAL
  Filled 2019-01-20 (×2): qty 1

## 2019-01-20 MED ORDER — LORAZEPAM 2 MG/ML IJ SOLN
1.0000 mg | Freq: Once | INTRAMUSCULAR | Status: DC
  Filled 2019-01-20: qty 1

## 2019-01-20 MED ORDER — ACETAMINOPHEN 160 MG/5ML PO SOLN: 650.0000 mg | ORAL | Status: DC | PRN

## 2019-01-20 MED ORDER — ACETAMINOPHEN 325 MG PO TABS: 650.0000 mg | ORAL_TABLET | ORAL | Status: DC | PRN

## 2019-01-20 MED ORDER — ACETAMINOPHEN 650 MG RE SUPP: 650.0000 mg | RECTAL | Status: DC | PRN
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
## 2019-01-20 NOTE — Progress Notes (Signed)
Patient refusing MRI per RN. 

## 2019-01-20 NOTE — Progress Notes (Signed)
  Echocardiogram 2D Echocardiogram has been performed.  Delcie Roch 01/20/2019, 3:35 PM

## 2019-01-20 NOTE — ED Triage Notes (Signed)
Pt here from home with c/o left side numbness that he notice when he awoke at 6 am this morning pt states that he felt fine last night

## 2019-01-20 NOTE — Consult Note (Signed)
Neurology Consultation  Reason for Consult: Possible stroke  Referring Physician: Melene Plan  CC: Left arm heaviness, left arm and hand tingling  History is obtained from: Patient  HPI: Lonnie Brown is a 59 y.o. male with past medical history of hypertension, hyperlipidemia and diabetes.  Patient woke up at 5 AM this morning and was feeling fine.  He noted at approximately 6 AM he had tingling in his left fingers and worked its way up to his arm, which also at that time felt heavy.  When he stood up he almost fell down secondary to left-sided weakness. There was concern for stroke thus neurology was consulted.  During consultation patient stated all of his symptoms had resolved with the exception of tingling in his left fingers in addition to slight weakness of his left hand.  He states that he does not take all of his medications consistently; he notes that he only takes aspirin once in a while and misses doses of his antihypertensives.  ED course  -CT head to evaluate for stroke, mass, bleed -Urinalysis, rapid drug screen, CMP, CBC, APTT, PT/INR, ethanol  Chart review-none  LKW: 5 AM tpa given?: no, symptoms quickly resolving Premorbid modified Rankin scale (mRS): 1 NIH stroke score: 2   Past Medical History:  Diagnosis Date  . Hypertension     Family History  Problem Relation Age of Onset  . Diabetes Mother   . Hypertension Mother   . Alcoholism Father    Social History:   reports that he has never smoked. He has never used smokeless tobacco. He reports that he does not drink alcohol or use drugs.  Medications  Current Facility-Administered Medications:  .  aspirin tablet 650 mg, 650 mg, Oral, STAT, Ulice Dash, PA-C  Current Outpatient Medications:  .  amLODipine (NORVASC) 10 MG tablet, Take 1 tablet (10 mg total) by mouth daily., Disp: 30 tablet, Rfl: 6 .  atorvastatin (LIPITOR) 10 MG tablet, Take 1 tablet (10 mg total) by mouth daily., Disp: 90 tablet, Rfl:  3 .  hydrochlorothiazide (HYDRODIURIL) 12.5 MG tablet, Take 1 tablet (12.5 mg total) by mouth daily. (Patient not taking: Reported on 01/20/2019), Disp: 90 tablet, Rfl: 3 .  metFORMIN (GLUCOPHAGE) 500 MG tablet, Take 2 tablets (1,000 mg total) by mouth daily with breakfast. (Patient not taking: Reported on 01/20/2019), Disp: 180 tablet, Rfl: 3 .  sildenafil (VIAGRA) 50 MG tablet, 1 tab half hour to 1 hour before sexual intercourse.  Limit to 1 tablet per 24-hour. (Patient not taking: Reported on 01/20/2019), Disp: 10 tablet, Rfl: 5  ROS:    General ROS: negative for - chills, fatigue, fever, night sweats, weight gain or weight loss Psychological ROS: negative for - behavioral disorder, hallucinations, memory difficulties, mood swings or suicidal ideation Ophthalmic ROS: negative for - blurry vision, double vision, eye pain or loss of vision ENT ROS: negative for - epistaxis, nasal discharge, oral lesions, sore throat, tinnitus or vertigo Respiratory ROS: Positive for - shortness of breath  Cardiovascular ROS: negative for - chest pain, dyspnea on exertion, edema or irregular heartbeat Gastrointestinal ROS: negative for - abdominal pain, diarrhea, hematemesis, nausea/vomiting or stool incontinence Genito-Urinary ROS: negative for - dysuria, hematuria, incontinence or urinary frequency/urgency Musculoskeletal ROS: Positive for - joint swelling and pain in left knee Neurological ROS: as noted in HPI Dermatological ROS: negative for rash and skin lesion changes  Exam: Current vital signs: BP (!) 177/97   Pulse 72   Temp 97.8 F (36.6 C) (Oral)  Resp (!) 21   SpO2 95%  Vital signs in last 24 hours: Temp:  [97.8 F (36.6 C)] 97.8 F (36.6 C) (01/11 0718) Pulse Rate:  [72-94] 72 (01/11 0830) Resp:  [20-30] 21 (01/11 0830) BP: (177-179)/(97-114) 177/97 (01/11 0830) SpO2:  [94 %-96 %] 95 % (01/11 0830)   Constitutional: Appears well-developed and well-nourished.  Eyes: No scleral  injection HENT: No OP obstrucion Head: Normocephalic.  Cardiovascular: Normal rate and regular rhythm.  Respiratory: Effort slightly labored when standing and walking GI: Soft.  No distension. There is no tenderness.  Skin: WDI  Neuro: Mental Status: Patient is awake, alert, oriented to person, place, month, year, and situation. Speech is fluent, with naming, repeating, comprehension intact. Patient is able to give a clear and coherent history. Cranial Nerves: II: Visual Fields are full. PERRL III,IV, VI: EOMI without ptosis or diplopia.  V: Facial sensation is symmetric to temperature VII: Facial movement is symmetric.  VIII: hearing is intact to voice X: Palate elevates symmetrically XI: Shoulder shrug is symmetric. XII: Tongue is midline without atrophy or fasciculations.  Motor: Tone is normal. Bulk is normal. 5/5 strength was present in all four extremities, except for mild left biceps weakness as well as subtle left pronator drift.   Sensory: Sensation is symmetric to light touch and temperature in the arms and legs. DSS showed extinction on the left. Deep Tendon Reflexes: 2+ and symmetric in the biceps Plantars: Toes are downgoing bilaterally.  Cerebellar: Left F-N-F ataxia noted Gait Slightly wide-based, able to stand on toes and heels without difficulty  Labs I have reviewed labs in epic and the results pertinent to this consultation are:   CBC    Component Value Date/Time   WBC 6.7 01/20/2019 0736   RBC 4.73 01/20/2019 0736   HGB 13.4 01/20/2019 0736   HGB 13.1 09/02/2018 1645   HCT 42.6 01/20/2019 0736   HCT 39.6 09/02/2018 1645   PLT 251 01/20/2019 0736   PLT 272 09/02/2018 1645   MCV 90.1 01/20/2019 0736   MCV 89 09/02/2018 1645   MCH 28.3 01/20/2019 0736   MCHC 31.5 01/20/2019 0736   RDW 13.3 01/20/2019 0736   RDW 13.7 09/02/2018 1645   LYMPHSABS 1.8 01/20/2019 0736   MONOABS 0.7 01/20/2019 0736   EOSABS 0.1 01/20/2019 0736   BASOSABS 0.0  01/20/2019 0736    CMP     Component Value Date/Time   NA 138 01/20/2019 0736   NA 141 09/02/2018 1645   K 4.1 01/20/2019 0736   CL 105 01/20/2019 0736   CO2 27 01/20/2019 0736   GLUCOSE 137 (H) 01/20/2019 0736   BUN 16 01/20/2019 0736   BUN 14 09/02/2018 1645   CREATININE 1.47 (H) 01/20/2019 0736   CALCIUM 9.2 01/20/2019 0736   PROT 7.3 01/20/2019 0736   PROT 7.8 09/02/2018 1645   ALBUMIN 3.5 01/20/2019 0736   ALBUMIN 4.2 09/02/2018 1645   AST 23 01/20/2019 0736   ALT 36 01/20/2019 0736   ALKPHOS 76 01/20/2019 0736   BILITOT 0.8 01/20/2019 0736   BILITOT 0.3 09/02/2018 1645   GFRNONAA 51 (L) 01/20/2019 0736   GFRAA 59 (L) 01/20/2019 0736    Lipid Panel     Component Value Date/Time   CHOL 131 09/02/2018 1645   TRIG 131 09/02/2018 1645   HDL 32 (L) 09/02/2018 1645   CHOLHDL 4.1 09/02/2018 1645   LDLCALC 73 09/02/2018 1645     Imaging I have reviewed the images obtained:  CT-scan  of the brain-negative  Felicie Morn PA-C [facility] [phone] 01/20/2019, 9:08 AM     Assessment: 59 year old male with a past medical history of hypertension, diabetes and high cholesterol, presenting to the [facility] ED with new onset of left arm weakness, left arm tingling and left leg weakness.   1. All of the patient's symptoms have resolved with the exception of mild tingling in his fingers. Subtle LUE motor deficits, left sided extinction and mild left sided ataxia are noted.  2. Exam findings best localize to the right thalamus, most likely secondary to an acute lacunar infarction.  . 3. Stroke risk factors: HTN and DM   Recommendations: # CTA of head and neck # Transthoracic Echo  # MRI brain if patient is able to tolerate (claustrophobic). Otherwise obtain repeat CT head in 72 hours.  # Start patient on ASA 325 mg daily following 650 mg oral loading dose  # Increase atorvastatin to 40 mg po qd. Obtain baseline CK level.  # BP goal: Permissive HTN for 24 hours: up  to 220/120  # HBAIC and Lipid profile # Telemetry monitoring # Frequent neuro checks # NPO until passes stroke swallow screen # please page stroke NP  Or  PA  Or MD from 8am -4 pm  as this patient from this time will be  followed by the stroke.   You can look them up on [email or web address]  Password [email or web address]  I have seen and evaluated the patient. I have formulated the assessment and recommendations. 59 year old male with left sided neurological symptoms and signs referable to an acute right thalamic lesion, most likely due to an acute lacunar infarction. Recommendations as above.  Electronically signed: Dr. Caryl Pina

## 2019-01-20 NOTE — Progress Notes (Signed)
Carotid duplex has been completed.   Preliminary results in CV Proc.   Blanch Media 01/20/2019 2:40 PM

## 2019-01-20 NOTE — ED Notes (Signed)
[phone] mrs Dotter would please like an update

## 2019-01-20 NOTE — ED Provider Notes (Signed)
[facility] EMERGENCY DEPARTMENT Provider Note   CSN: [id number] Arrival date & time: 01/20/19  0093     History Chief Complaint  Patient presents with  . Cerebrovascular Accident    Lonnie Brown is a 59 y.o. male.  60 yo M with a chief complaints of left-sided numbness and weakness.  Noticed this when he woke up this morning at 6 AM noticed that he was having trouble walking because he felt like his left leg was weak.  He denies headache denies head injury.  Feels like he has some weakness off and on to his left arm as well.  Has some mild difficulty with talking.  Denies any visual changes.  Denies any difficulty with swallowing.  The history is provided by the patient.  Cerebrovascular Accident This is a new problem. The current episode started less than 1 hour ago. The problem occurs constantly. The problem has not changed since onset.Pertinent negatives include no chest pain, no abdominal pain, no headaches and no shortness of breath. Nothing aggravates the symptoms. Nothing relieves the symptoms. He has tried nothing for the symptoms. The treatment provided no relief.       Past Medical History:  Diagnosis Date  . Diabetes ([place])   . Hypercholesterolemia   . Hypertension     Patient Active Problem List   Diagnosis Date Noted  . Hyperlipidemia associated with type 2 diabetes mellitus ([place]) 12/02/2018  . Chronic midline low back pain without sciatica 09/02/2018  . Controlled type 2 diabetes mellitus without complication, without long-term current use of insulin ([place]) 03/01/2018  . Erectile dysfunction 03/01/2018  . Noncompliance with medications 03/01/2018  . CKD (chronic kidney disease) stage 3, GFR 30-59 ml/min 08/27/2017  . Hyperlipidemia 09/25/2016  . Morbid obesity ([place]) 07/20/2015  . Achilles tendinitis 04/09/2015  . Hypertension 03/04/2015    Past Surgical History:  Procedure Laterality Date  . EYE SURGERY    . TONSILLECTOMY          Family History  Problem Relation Age of Onset  . Diabetes Mother   . Hypertension Mother   . Alcoholism Father     Social History   Tobacco Use  . Smoking status: Never Smoker  . Smokeless tobacco: Never Used  Substance Use Topics  . Alcohol use: No  . Drug use: No    Home Medications Prior to Admission medications   Medication Sig Start Date End Date Taking? Authorizing Provider  amLODipine (NORVASC) 10 MG tablet Take 1 tablet (10 mg total) by mouth daily. 05/30/18  Yes Ladell Pier, MD  atorvastatin (LIPITOR) 10 MG tablet Take 1 tablet (10 mg total) by mouth daily. 05/30/18  Yes Ladell Pier, MD  hydrochlorothiazide (HYDRODIURIL) 12.5 MG tablet Take 1 tablet (12.5 mg total) by mouth daily. Patient not taking: Reported on 01/20/2019 09/02/18   Ladell Pier, MD  metFORMIN (GLUCOPHAGE) 500 MG tablet Take 2 tablets (1,000 mg total) by mouth daily with breakfast. Patient not taking: Reported on 01/20/2019 09/02/18   Ladell Pier, MD  sildenafil (VIAGRA) 50 MG tablet 1 tab half hour to 1 hour before sexual intercourse.  Limit to 1 tablet per 24-hour. Patient not taking: Reported on 01/20/2019 02/28/18   Ladell Pier, MD    Allergies    Lisinopril and Banana  Review of Systems   Review of Systems  Constitutional: Negative for chills and fever.  HENT: Negative for congestion and facial swelling.   Eyes: Negative for discharge and visual  disturbance.  Respiratory: Negative for shortness of breath.   Cardiovascular: Negative for chest pain and palpitations.  Gastrointestinal: Negative for abdominal pain, diarrhea and vomiting.  Musculoskeletal: Negative for arthralgias and myalgias.  Skin: Negative for color change and rash.  Neurological: Positive for weakness. Negative for tremors, syncope and headaches.  Psychiatric/Behavioral: Negative for confusion and dysphoric mood.    Physical Exam Updated Vital Signs BP (!) 156/94   Pulse 71   Temp  97.8 F (36.6 C) (Oral)   Resp (!) 24   SpO2 99%   Physical Exam Vitals and nursing note reviewed.  Constitutional:      Appearance: He is well-developed.  HENT:     Head: Normocephalic and atraumatic.  Eyes:     Pupils: Pupils are equal, round, and reactive to light.  Neck:     Vascular: No JVD.  Cardiovascular:     Rate and Rhythm: Normal rate and regular rhythm.     Heart sounds: No murmur. No friction rub. No gallop.   Pulmonary:     Effort: No respiratory distress.     Breath sounds: No wheezing.  Abdominal:     General: There is no distension.     Tenderness: There is no guarding or rebound.  Musculoskeletal:        General: Normal range of motion.     Cervical back: Normal range of motion and neck supple.  Skin:    Coloration: Skin is not pale.     Findings: No rash.  Neurological:     Mental Status: He is alert and oriented to person, place, and time.     Comments: Slurred speech, no obvious facial asymmetry no obvious left lower extremity weakness.  Psychiatric:        Behavior: Behavior normal.     ED Results / Procedures / Treatments   Labs (all labs ordered are listed, but only abnormal results are displayed) Labs Reviewed  COMPREHENSIVE METABOLIC PANEL - Abnormal; Notable for the following components:      Result Value   Glucose, Bld 137 (*)    Creatinine, Ser 1.47 (*)    GFR calc non Af Amer 51 (*)    GFR calc Af Amer 59 (*)    All other components within normal limits  URINALYSIS, ROUTINE W REFLEX MICROSCOPIC - Abnormal; Notable for the following components:   Color, Urine STRAW (*)    All other components within normal limits  I-STAT CHEM 8, ED - Abnormal; Notable for the following components:   Creatinine, Ser 1.40 (*)    Glucose, Bld 128 (*)    All other components within normal limits  SARS CORONAVIRUS 2 (TAT 6-24 HRS)  ETHANOL  PROTIME-INR  APTT  CBC  DIFFERENTIAL  RAPID URINE DRUG SCREEN, HOSP PERFORMED    EKG EKG  Interpretation  Date/Time:  Monday January 20 2019 07:17:54 EST Ventricular Rate:  78 PR Interval:  178 QRS Duration: 108 QT Interval:  376 QTC Calculation: 428 R Axis:   -21 Text Interpretation: Normal sinus rhythm Incomplete right bundle branch block Inferior infarct , age undetermined Cannot rule out Anterior infarct , age undetermined Abnormal ECG No significant change since last tracing Confirmed by Melene Plan [phone]) on 01/20/2019 7:21:17 AM Also confirmed by Melene Plan [phone]), editor Elita Quick ([id number])  on 01/20/2019 10:38:53 AM   Radiology CT HEAD WO CONTRAST  Result Date: 01/20/2019 CLINICAL DATA:  Left-sided numbness EXAM: CT HEAD WITHOUT CONTRAST TECHNIQUE: Contiguous axial images were obtained from the  base of the skull through the vertex without intravenous contrast. COMPARISON:  None. FINDINGS: Brain: No evidence of acute infarction, hemorrhage, hydrocephalus, extra-axial collection or mass lesion/mass effect. Vascular: Negative for hyperdense vessel Skull: Negative Sinuses/Orbits: Negative Other: None IMPRESSION: Negative CT head Electronically Signed   By: Marlan Palau M.D.   On: 01/20/2019 08:06    Procedures Procedures (including critical care time)  Medications Ordered in ED Medications  LORazepam (ATIVAN) injection 1 mg (1 mg Intravenous Not Given 01/20/19 1107)  aspirin tablet 650 mg (650 mg Oral Given 01/20/19 7989)    ED Course  I have reviewed the triage vital signs and the nursing notes.  Pertinent labs & imaging results that were available during my care of the patient were reviewed by me and considered in my medical decision making (see chart for details).    MDM Rules/Calculators/A&P                      59 yo M with a chief complaints of left-sided weakness.  Noticed that this morning when he woke.  Not obviously weak on exam though seems to have some significant left leg weakness when he tries to ambulate.  Will obtain a CT scan of the head.   Discussed with neurology.  Neurology recommended a MRI of the head and medical admission.  Patient unfortunately was unable to tolerate MRI.  He would not like to try anxiety medicines.  I discussed this with neurology, Dr. Otelia Limes who recommended CT angiogram of the head and neck.  Recommended repeat CT scan in 3 days time to evaluate for changes.  Continue to agree for admission for likely stroke/TIA.  The patients results and plan were reviewed and discussed.   Any x-rays performed were independently reviewed by myself.   Differential diagnosis were considered with the presenting HPI.  Medications  LORazepam (ATIVAN) injection 1 mg (1 mg Intravenous Not Given 01/20/19 1107)  aspirin tablet 650 mg (650 mg Oral Given 01/20/19 0918)    Vitals:   01/20/19 0745 01/20/19 0830 01/20/19 0945 01/20/19 1200  BP:  (!) 177/97 (!) 170/91 (!) 156/94  Pulse: 94 72 72 71  Resp: (!) 30 (!) 21 (!) 21 (!) 24  Temp:      TempSrc:      SpO2: 94% 95% 97% 99%    Final diagnoses:  Acute CVA (cerebrovascular accident) (HCC)    Admission/ observation were discussed with the admitting physician, patient and/or family and they are comfortable with the plan.   Final Clinical Impression(s) / ED Diagnoses Final diagnoses:  Acute CVA (cerebrovascular accident) [facility])    Rx / DC Orders ED Discharge Orders    None       Melene Plan, DO 01/20/19 1258

## 2019-01-20 NOTE — ED Notes (Signed)
Pt refused MRI.

## 2019-01-20 NOTE — ED Notes (Signed)
Went to MRI to give patient ativan however, patient refused states he didn't want it. I explained to patient how this would help him to relax.  States  He didn't want to have the MRI

## 2019-01-21 ENCOUNTER — Observation Stay (HOSPITAL_COMMUNITY): Payer: Self-pay

## 2019-01-21 DIAGNOSIS — E1159 Type 2 diabetes mellitus with other circulatory complications: Secondary | ICD-10-CM

## 2019-01-21 DIAGNOSIS — I639 Cerebral infarction, unspecified: Secondary | ICD-10-CM

## 2019-01-21 DIAGNOSIS — E7800 Pure hypercholesterolemia, unspecified: Secondary | ICD-10-CM

## 2019-01-21 DIAGNOSIS — G459 Transient cerebral ischemic attack, unspecified: Secondary | ICD-10-CM

## 2019-01-21 DIAGNOSIS — I10 Essential (primary) hypertension: Secondary | ICD-10-CM

## 2019-01-21 LAB — GLUCOSE, CAPILLARY
Glucose-Capillary: 121 mg/dL — ABNORMAL HIGH (ref 70–99)
Glucose-Capillary: 103 mg/dL — ABNORMAL HIGH (ref 70–99)
Glucose-Capillary: 134 mg/dL — ABNORMAL HIGH (ref 70–99)

## 2019-01-21 MED ORDER — IOHEXOL 350 MG/ML SOLN
75.0000 mL | Freq: Once | INTRAVENOUS | Status: AC | PRN
  Administered 2019-01-21: 75 mL via INTRAVENOUS

## 2019-01-21 MED ORDER — LACTATED RINGERS IV SOLN: INTRAVENOUS | Status: AC

## 2019-01-21 NOTE — Evaluation (Signed)
Physical Therapy Evaluation Patient Details Name: Lonnie Brown MRN: [id number] DOB: 1960-09-25 Today's Date: 01/21/2019   History of Present Illness  59 year old male with a past medical history of hypertension, diabetes and high cholesterol, presenting to the [facility] ED with new onset of left arm weakness, left arm tingling and left leg weakness. CT head was negative. Pt is claustrophobic and is not comfortable with MRI at this time.   Clinical Impression  Patient received sitting up on side of bed with OT present. Reports slight numbness/weakness of Left LE. Patient is pleasant and agrees to PT assessment. Patient transitions from sit to stand with supervision, ambulated 100 feet without AD, slight buckling of left knee with initial ambulation. Continued ambulation with SPC. Instructed on sequencing and proper use of cane. Patient reports improve stability and confidence with cane use. He will continue to benefit from skilled PT while here to ensure safety with mobility for return home at discharge.       Follow Up Recommendations No PT follow up    Equipment Recommendations  Cane    Recommendations for Other Services       Precautions / Restrictions Precautions Precautions: Fall Precaution Comments: low fall risk Restrictions Weight Bearing Restrictions: No      Mobility  Bed Mobility               General bed mobility comments: patient received sitting up on edge of bed, OT present  Transfers Overall transfer level: Needs assistance Equipment used: None Transfers: Sit to/from Stand              Ambulation/Gait Ambulation/Gait assistance: Supervision Gait Distance (Feet): 200 Feet Assistive device: Straight cane Gait Pattern/deviations: Decreased weight shift to left;Step-through pattern Gait velocity: decreased   General Gait Details: patient has slight weakness and mild buckling of left knee with gait. Ambulated 100 feet without AD, then 100 feet with  SPC  Stairs Stairs: Yes Stairs assistance: Supervision Stair Management: One rail Right;Alternating pattern Number of Stairs: 3 General stair comments: cues needed to lead with right LE if L weak.  Wheelchair Mobility    Modified Rankin (Stroke Patients Only) Modified Rankin (Stroke Patients Only) Pre-Morbid Rankin Score: No symptoms Modified Rankin: No significant disability     Balance Overall balance assessment: Mild deficits observed, not formally tested                                           Pertinent Vitals/Pain Pain Assessment: No/denies pain    Home Living Family/patient expects to be discharged to:: Private residence Living Arrangements: Spouse/significant other Available Help at Discharge: Family;Available PRN/intermittently Type of Home: Apartment Home Access: Level entry     Home Layout: One level Home Equipment: Grab bars - tub/shower Additional Comments: lives with his wife they both work    Prior Function Level of Independence: Independent         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;     Journalist, newspaper        Extremity/Trunk Assessment   Upper Extremity Assessment Upper Extremity Assessment: Defer to OT evaluation    Lower Extremity Assessment Lower Extremity Assessment: Generalized weakness;LLE deficits/detail LLE Deficits / Details: mild weakness of L LE. Slight knee buckling with initial gait. LLE Sensation: decreased light touch LLE Coordination: decreased gross motor       Communication   Communication: No difficulties  Cognition Arousal/Alertness: Awake/alert Behavior During Therapy: WFL for tasks assessed/performed Overall Cognitive Status: Within Functional Limits for tasks assessed                                        General Comments      Exercises     Assessment/Plan    PT Assessment Patient needs continued PT services  PT Problem List Decreased  strength;Decreased activity tolerance;Decreased mobility;Decreased knowledge of use of DME;Decreased balance       PT Treatment Interventions DME instruction;Gait training;Functional mobility training;Therapeutic activities;Therapeutic exercise;Balance training;Patient/family education    PT Goals (Current goals can be found in the Care Plan section)  Acute Rehab PT Goals Patient Stated Goal: to return to prior level PT Goal Formulation: With patient Time For Goal Achievement: 01/28/19 Potential to Achieve Goals: Good    Frequency Min 2X/week   Barriers to discharge        Co-evaluation               AM-PAC PT "6 Clicks" Mobility  Outcome Measure Help needed turning from your back to your side while in a flat bed without using bedrails?: None Help needed moving from lying on your back to sitting on the side of a flat bed without using bedrails?: None Help needed moving to and from a bed to a chair (including a wheelchair)?: None Help needed standing up from a chair using your arms (e.g., wheelchair or bedside chair)?: None Help needed to walk in hospital room?: None Help needed climbing 3-5 steps with a railing? : A Little 6 Click Score: 23    End of Session   Activity Tolerance: Patient tolerated treatment well Patient left: in bed;Other (comment)(OT present) Nurse Communication: Mobility status PT Visit Diagnosis: Unsteadiness on feet (R26.81);Other abnormalities of gait and mobility (R26.89);Muscle weakness (generalized) (M62.81);Other symptoms and signs involving the nervous system (R29.898)    Time: 9211-9417 PT Time Calculation (min) (ACUTE ONLY): 12 min   Charges:   PT Evaluation $PT Eval Moderate Complexity: 1 Mod PT Treatments $Gait Training: 8-22 mins        Michaiah Maiden, PT, GCS 01/21/19,11:52 AM

## 2019-01-21 NOTE — Evaluation (Signed)
Speech Language Pathology Evaluation Patient Details Name: Nguyen Todorov MRN: [id number] DOB: August 06, 1960 Today's Date: 01/21/2019 Time: 0086-7619 SLP Time Calculation (min) (ACUTE ONLY): 22 min  Problem List:  Patient Active Problem List   Diagnosis Date Noted  . CVA (cerebral vascular accident) (H[facility]) 01/20/2019  . Hyperlipidemia associated with type 2 diabetes mellitus ([facility]) 12/02/2018  . Chronic midline low back pain without sciatica 09/02/2018  . Controlled type 2 diabetes mellitus without complication, without long-term current use of insulin ([facility]) 03/01/2018  . Erectile dysfunction 03/01/2018  . Noncompliance with medications 03/01/2018  . CKD (chronic kidney disease) stage 3, GFR 30-59 ml/min 08/27/2017  . Hyperlipidemia 09/25/2016  . Morbid obesity ([facility]) 07/20/2015  . Achilles tendinitis 04/09/2015  . Hypertension 03/04/2015   Past Medical History:  Past Medical History:  Diagnosis Date  . Diabetes ([facility])   . Hypercholesterolemia   . Hypertension    Past Surgical History:  Past Surgical History:  Procedure Laterality Date  . EYE SURGERY    . TONSILLECTOMY     HPI:  59 year old male with a past medical history of hypertension, diabetes and high cholesterol, presenting to the [facility] ED with new onset of left arm weakness, left arm tingling and left leg weakness. CT head was negative. Pt is claustrophobic and is not comfortable with MRI at this time.    Assessment / Plan / Recommendation Clinical Impression   Patient seen at bedside for speech/language, cognitive communication evaluation. Patient with no cognitive-linguistic impairments identified during this assessment. Patient presents with [facility] expressive and receptive language skills. Patient able to answer complex yes/no questions, follow multi-step verbal commands and demonstrate understanding of spoken and written information. Informal reading assessment was given, patient's reading skills are [facility] for functional  tasks. Speech is clear, articulation is precise. Verbal repetition skills are intact. Some sub-tests from the COGNISTAT were administered. Scores as follows below:  COGNISTAT II. Attention: 8/8 WFL VI: Memory 11/12 WFL VIII: Reasoning WFL Similarities: 8/8 WFL Judgement: 6/6 WFL  Of note: patient complaints of left knee pain. Patient reports that he fell "yesterday evening" while using bedside urinal. Patient reports to this clinician that he did not report the fall to any staff as he felt it was "nobody's fault." SLP notified patient's nurse, Nehemiah Settle, and charge nurse, Gershon Cull, of patient's report of a fall yesterday evening.   SLP d/w patient importance of reporting incidents for safety, patient verbalized understanding. SLP reviewed BEFAST acronym with patient for stroke awareness. Speech to sign off as ST services are not warranted at this time. Please re-consult ST should new needs arise.    SLP Assessment  SLP Recommendation/Assessment: Patient does not need any further Speech Lanaguage Pathology Services SLP Visit Diagnosis: Cognitive communication deficit (R41.841)    Follow Up Recommendations  None Please re-consult ST should any new needs arise.   Frequency and Duration   N/A        SLP Evaluation Cognition  Overall Cognitive Status: Within Functional Limits for tasks assessed Arousal/Alertness: Awake/alert Orientation Level: Oriented X4 Attention: Sustained;Focused Focused Attention: Appears intact Sustained Attention: Appears intact Memory: Appears intact Awareness: Appears intact Problem Solving: Appears intact Executive Function: Reasoning Reasoning: Appears intact       Comprehension  Auditory Comprehension Overall Auditory Comprehension: Appears within functional limits for tasks assessed Yes/No Questions: Within Functional Limits Commands: Within Functional Limits Conversation: Complex Visual Recognition/Discrimination Discrimination: Not  tested Reading Comprehension Reading Status: Within funtional limits    Expression Expression Primary Mode of Expression: Verbal Verbal Expression  Overall Verbal Expression: Appears within functional limits for tasks assessed Initiation: No impairment Level of Generative/Spontaneous Verbalization: Conversation Repetition: No impairment Naming: No impairment Pragmatics: No impairment Written Expression Dominant Hand: Right Written Expression: Not tested   Oral / Motor  Motor Speech Overall Motor Speech: Appears within functional limits for tasks assessed Respiration: Within functional limits   GO            Marina Goodell, M.Ed., CCC-SLP Speech Therapy Acute Rehabilitation [phone]          Marina Goodell 01/21/2019, 12:03 PM

## 2019-01-21 NOTE — Progress Notes (Signed)
   Subjective:   Lonnie Brown was seen on morning rounds. He is feeling well this morning. Still having some altered sensation in his left upper and lower extremity, but feels improved compared to yesterday. No headaches, weakness, double or blurred vision, or speech difficulty.   Objective:  Vital signs in last 24 hours: Vitals:   01/20/19 2230 01/21/19 0006 01/21/19 0230 01/21/19 0731  BP: (!) 163/94 (!) 143/87 108/88 (!) 156/89  Pulse: 78 69 70 73  Resp: 18 18 20 18   Temp: 98.1 F (36.7 C) 97.8 F (36.6 C) 97.9 F (36.6 C) 97.8 F (36.6 C)  TempSrc: Oral Oral Oral Oral  SpO2: 96% 97% 98% 98%   General: pleasant gentleman, sitting up in bed in NAD CV: RRR; no m/r/g Pulm: normal work of breathing; lungs CTAB Neuro: Cranial nerves I-XII intact. Strength is 5/5 in bilateral upper and lower extremities. Sensation grossly intact.   Assessment/Plan:  Active Problems:   CVA (cerebral vascular accident) [facility])  Lonnie Brown is a 59 y/o male with a PMHx of T2DM, CKD stage 3, HTN, HLD who presents to the ED with c/o left sided weakness currently being admitted for TIA/CVA work up.   # Left-sided Numbness:  Differential includes CVA vs TIA, however favoring TIA at this point. Numbness is improving. CTA head/neck obtained this AM showed no acute abnormality. TTE and B/L carotid ultrasound are both unremarkable. Will follow up if Neurology has any other recommendations at this point.   - Neurology is on board - Aspirin 81 mg QD - Telemetry  - Permissive HTN up to 220/110  - PT/OT evaluation   # Type 2 Diabetes:  - Plan to restart Metformin 1000 mg BID on discharge  - SSI   # Hypertension: Home medication includes Amlodipine only. At this time, allowing for permissive hypertension, so will hold. Plan is to restart tomorrow AM at the 48 hour mark since last normal.   - Hold Amlodipine   # Hyperlipidemia:  Lipid panel with LDL of 85 and HDL of 30. Long term goal LDL  is < 70. He is taking a low dose of Atorvastatin, so will benefit from increasing dose.   - Atorvastatin 80 mg QD   # CKD Stage 3:  Creatinine is at baseline on admission. Since contrast was administered today, patient was preemptively given IV fluids, which we will continue through tonight. Repeat BMP in the AM to ensure no acute worsening in function.    - BMP    Dispo: Anticipated discharge in 0-1 days.   Dr. 67 Internal Medicine PGY-1  Pager: [phone] 01/21/2019, 10:22 AM

## 2019-01-21 NOTE — Progress Notes (Addendum)
Occupational Therapy Evaluation Patient Details Name: Lonnie Brown MRN: [id number] DOB: 06/20/1960 Today's Date: 01/21/2019    History of Present Illness 59 year old male with a past medical history of hypertension, diabetes and high cholesterol, presenting to the [facility] ED with new onset of left arm weakness, left arm tingling and left leg weakness. CT head was negative. Pt is claustrophobic and is not comfortable with MRI at this time.    Clinical Impression   PTA, pt was living at home with his wife, and was independent with ADL/IADL and functional mobiltiy. He was driving and working. Pt currently demonstrates ability to complete ADL/IADL at independent to modified independent level. Pt demonstrates increased DoE 3/4 during LB dressing, educated pt on use of AE and energy conservation strategies with provided handouts and demonstration. Pt able to return demonstrate use of AE for LB dressing with DoE 1/4.  He utilized spc to increase stability during functional mobility due to slight buckling of left knee. Pt reports chronic back pain, educated him on back precautions for prevention of further back pain.  Pt will continue to benefit from skilled OT services to maximize safety and independence with ADL/IADL and functional mobility. Will continue to follow acutely and progress as tolerated.      Follow Up Recommendations  No OT follow up    Equipment Recommendations       Recommendations for Other Services       Precautions / Restrictions Precautions Precautions: Fall Precaution Comments: low fall risk Restrictions Weight Bearing Restrictions: No      Mobility Bed Mobility Overal bed mobility: Modified Independent             General bed mobility comments: increased effort  Transfers Overall transfer level: Needs assistance Equipment used: None Transfers: Sit to/from Stand Sit to Stand: Modified independent (Device/Increase time)         General transfer comment:  increased time and effort    Balance Overall balance assessment: Mild deficits observed, not formally tested                                         ADL either performed or assessed with clinical judgement   ADL Overall ADL's : Modified independent                                       General ADL Comments: pt demonstrated ability to complete ADL with modified independence, he requries increased time and effort during LB dressing, DoE 3/4;spO2 RA >97% HR WNL throughout session;educated pt on energy conservation strategies with provided handout and pt able to return demonstrate use of AE to assist with LB dressing, following use of AE to doff/don socks pt DoE 1/4;additionally educated pt on back precautions for prevention as pt reports chronic back pain;pt very interested and extremely grateful for provided information     Vision Baseline Vision/History: Wears glasses Wears Glasses: At all times Patient Visual Report: No change from baseline Vision Assessment?: No apparent visual deficits Additional Comments: pt does not have glasses present     Perception     Praxis      Pertinent Vitals/Pain Pain Assessment: 0-10 Pain Score: 2  Pain Location: left knee Pain Descriptors / Indicators: Tingling;Aching Pain Intervention(s): Limited activity within patient's tolerance;Monitored during session     Hand  Dominance Right   Extremity/Trunk Assessment Upper Extremity Assessment Upper Extremity Assessment: LUE deficits/detail LUE Deficits / Details: WFL limits but appears to have slightly decreased coordination compared to RUE;pt with no reports of numbness LUE Coordination: decreased gross motor;decreased fine motor   Lower Extremity Assessment Lower Extremity Assessment: Defer to PT evaluation LLE Deficits / Details: mild weakness of L LE. Slight knee buckling with initial gait. LLE Sensation: decreased light touch LLE Coordination: decreased  gross motor   Cervical / Trunk Assessment Cervical / Trunk Assessment: Normal   Communication Communication Communication: No difficulties   Cognition Arousal/Alertness: Awake/alert Behavior During Therapy: WFL for tasks assessed/performed Overall Cognitive Status: Within Functional Limits for tasks assessed                                 General Comments: Pt very motivated and appreciative when working with therapy   General Comments       Exercises     Shoulder Instructions      Home Living Family/patient expects to be discharged to:: Private residence Living Arrangements: Spouse/significant other Available Help at Discharge: Family Type of Home: Apartment Home Access: Level entry     Home Layout: One level     Bathroom Shower/Tub: Chief Strategy Officer: Standard Bathroom Accessibility: Yes How Accessible: Accessible via walker Home Equipment: Grab bars - tub/shower   Additional Comments: lives with his wife they both work  Lives With: Spouse    Prior Functioning/Environment Level of Independence: Independent        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;        OT Problem List: Impaired balance (sitting and/or standing);Decreased safety awareness;Pain      OT Treatment/Interventions:      OT Goals(Current goals can be found in the care plan section) Acute Rehab OT Goals Patient Stated Goal: to return to prior level OT Goal Formulation: With patient Time For Goal Achievement: 02/04/19 Potential to Achieve Goals: Good  OT Frequency:     Barriers to D/C:            Co-evaluation              AM-PAC OT "6 Clicks" Daily Activity     Outcome Measure Help from another person eating meals?: None Help from another person taking care of personal grooming?: None Help from another person toileting, which includes using toliet, bedpan, or urinal?: None Help from another person bathing (including washing,  rinsing, drying)?: None Help from another person to put on and taking off regular upper body clothing?: None Help from another person to put on and taking off regular lower body clothing?: None 6 Click Score: 24   End of Session Nurse Communication: Mobility status  Activity Tolerance: Patient tolerated treatment well Patient left: in bed;with call bell/phone within reach(with SLP present)  OT Visit Diagnosis: Other abnormalities of gait and mobility (R26.89);Pain Pain - part of body: (back)                Time: 1010-1038 OT Time Calculation (min): 28 min Charges:  OT General Charges $OT Visit: 1 Visit OT Evaluation $OT Eval Moderate Complexity: 1 Mod OT Treatments $Self Care/Home Management : 23-37 mins  Diona Browner OTR/L Acute Rehabilitation Services Office: [phone]   Rebeca Alert 01/21/2019, 12:32 PM

## 2019-01-21 NOTE — TOC Initial Note (Addendum)
Transition of Care [facility]) - Initial/Assessment Note    Patient Details  Name: Lonnie Brown MRN: [id number] Date of Birth: 08/20/1960  Transition of Care [facility]) CM/SW Contact:    Kermit Balo, RN Phone Number: 01/21/2019, 12:05 PM  Clinical Narrative:                 Pt without insurance but is active with [facility] and uses their pharmacy to medication assistance.  Pt denies issues with transportation and doesn't use any DME at home.  Per patient wife can provide assistance at home.  TOC following.  Addendum (1540): cane delivered to the room per [employer].   Expected Discharge Plan: Home/Self Care Barriers to Discharge: Continued Medical Work up, Inadequate or no insurance, Barriers Unresolved (comment)   Patient Goals and CMS Choice        Expected Discharge Plan and Services Expected Discharge Plan: Home/Self Care   Discharge Planning Services: CM Consult   Living arrangements for the past 2 months: Apartment                                      Prior Living Arrangements/Services Living arrangements for the past 2 months: Apartment Lives with:: Spouse Patient language and need for interpreter reviewed:: Yes Do you feel safe going back to the place where you live?: Yes      Need for Family Participation in Patient Care: No (Comment) Care giver support system in place?: Yes (comment)(pt states wife can provide support at home)   Criminal Activity/Legal Involvement Pertinent to Current Situation/Hospitalization: No - Comment as needed  Activities of Daily Living      Permission Sought/Granted                  Emotional Assessment Appearance:: Appears stated age Attitude/Demeanor/Rapport: Engaged Affect (typically observed): Accepting, Pleasant Orientation: : Oriented to Self, Oriented to Place, Oriented to  Time, Oriented to Situation   Psych Involvement: No (comment)  Admission diagnosis:  CVA (cerebral vascular accident) (HCC)  [I63.9] Acute CVA (cerebrovascular accident) [facility]) [I63.9] Patient Active Problem List   Diagnosis Date Noted  . CVA (cerebral vascular accident) (HCC) 01/20/2019  . Hyperlipidemia associated with type 2 diabetes mellitus (HCC) 12/02/2018  . Chronic midline low back pain without sciatica 09/02/2018  . Controlled type 2 diabetes mellitus without complication, without long-term current use of insulin (HCC) 03/01/2018  . Erectile dysfunction 03/01/2018  . Noncompliance with medications 03/01/2018  . CKD (chronic kidney disease) stage 3, GFR 30-59 ml/min 08/27/2017  . Hyperlipidemia 09/25/2016  . Morbid obesity (HCC) 07/20/2015  . Achilles tendinitis 04/09/2015  . Hypertension 03/04/2015   PCP:  Marcine Matar, MD Pharmacy:   [facility] - [place], [place] - [address] [place] [address] Phone: [phone] Fax: [phone]     Social Determinants of Health (SDOH) Interventions    Readmission Risk Interventions No flowsheet data found.

## 2019-01-21 NOTE — Progress Notes (Addendum)
Pt. States he fell last night on his knees. Assessed patient- no signs of injury. Pt. Reports no pain. Bed alarm on, bed in lowest position, call bell in reach. Notified charge nurse and paged Dr. Huel Cote, no new orders. PT has already evaluated patient.

## 2019-01-21 NOTE — Progress Notes (Signed)
STROKE TEAM PROGRESS NOTE   INTERVAL HISTORY Pt lying in bed, stated that his left side weakness is much improved but not 100% back. However, no significant weakness or numbness on exam. He refused MRI, CTA head and neck no LVO but moderate right PCA stenosis.  Vitals:   01/21/19 0230 01/21/19 0731 01/21/19 1133 01/21/19 1143  BP: 108/88 (!) 156/89 (!) 160/103   Pulse: 70 73 81   Resp: 20 18 18    Temp: 97.9 F (36.6 C) 97.8 F (36.6 C) 98.3 F (36.8 C)   TempSrc: Oral Oral Oral   SpO2: 98% 98% 100% 100%    CBC:  Recent Labs  Lab 01/20/19 0736 01/20/19 0747  WBC 6.7  --   NEUTROABS 4.1  --   HGB 13.4 14.3  HCT 42.6 42.0  MCV 90.1  --   PLT 251  --     Basic Metabolic Panel:  Recent Labs  Lab 01/20/19 0736 01/20/19 0747  NA 138 139  K 4.1 4.1  CL 105 104  CO2 27  --   GLUCOSE 137* 128*  BUN 16 17  CREATININE 1.47* 1.40*  CALCIUM 9.2  --    Lipid Panel:     Component Value Date/Time   CHOL 129 01/20/2019 1330   CHOL 131 09/02/2018 1645   TRIG 68 01/20/2019 1330   HDL 30 (L) 01/20/2019 1330   HDL 32 (L) 09/02/2018 1645   CHOLHDL 4.3 01/20/2019 1330   VLDL 14 01/20/2019 1330   LDLCALC 85 01/20/2019 1330   LDLCALC 73 09/02/2018 1645   HgbA1c:  Lab Results  Component Value Date   HGBA1C 7.0 (H) 01/20/2019   Urine Drug Screen:     Component Value Date/Time   LABOPIA NONE DETECTED 01/20/2019 0757   COCAINSCRNUR NONE DETECTED 01/20/2019 0757   LABBENZ NONE DETECTED 01/20/2019 0757   AMPHETMU NONE DETECTED 01/20/2019 0757   THCU NONE DETECTED 01/20/2019 0757   LABBARB NONE DETECTED 01/20/2019 0757    Alcohol Level     Component Value Date/Time   ETH <10 01/20/2019 0736    IMAGING past 48 hours CT ANGIO HEAD W OR WO CONTRAST  Addendum Date: 01/21/2019   ADDENDUM REPORT: 01/21/2019 13:03 ADDENDUM: Enlargement left lobe of thyroid with ill-defined low-density area. Recommend thyroid ultrasound to rule out nodule. Electronically Signed   By: 03/21/2019 M.D.   On: 01/21/2019 13:03   Result Date: 01/21/2019 CLINICAL DATA:  Stroke.  Left-sided numbness. EXAM: CT ANGIOGRAPHY HEAD AND NECK TECHNIQUE: Multidetector CT imaging of the head and neck was performed using the standard protocol during bolus administration of intravenous contrast. Multiplanar CT image reconstructions and MIPs were obtained to evaluate the vascular anatomy. Carotid stenosis measurements (when applicable) are obtained utilizing NASCET criteria, using the distal internal carotid diameter as the denominator. CONTRAST:  51mL OMNIPAQUE IOHEXOL 350 MG/ML SOLN COMPARISON:  CT head 01/20/2019 FINDINGS: CT HEAD FINDINGS Brain: No evidence of acute infarction, hemorrhage, hydrocephalus, extra-axial collection or mass lesion/mass effect. Vascular: Negative for hyperdense vessel Skull: Negative Sinuses: Negative Orbits: None Review of the MIP images confirms the above findings CTA NECK FINDINGS Aortic arch: Standard branching. Imaged portion shows no evidence of aneurysm or dissection. No significant stenosis of the major arch vessel origins. Right carotid system: Normal right carotid system. Negative for stenosis or dissection. Left carotid system: Normal left carotid system. Mild atherosclerotic disease proximal left internal carotid artery due to noncalcified plaque. Less than 25% diameter stenosis. Vertebral arteries: Left vertebral  artery dominant. Both vertebral arteries patent to the basilar without stenosis. Skeleton: No acute skeletal abnormality. Other neck: Enlargement of the left lobe of the thyroid with ill-defined low-density area in the left lobe. Well-defined cyst in the left isthmus measuring 7 mm. Tiny cyst right lobe of thyroid. Upper chest: Negative Review of the MIP images confirms the above findings CTA HEAD FINDINGS Anterior circulation: Mild atherosclerotic calcification cavernous carotid bilaterally without stenosis or aneurysm. Posterior circulation: Both vertebral arteries  patent to the basilar. Left vertebral artery dominant. PICA patent bilaterally. Basilar widely patent. Superior cerebellar and posterior cerebral arteries patent bilaterally. Fetal origin right posterior cerebral artery. Moderate stenosis right P2 segment.  Mild disease left P2 segment. Venous sinuses: Normal venous enhancement. Anatomic variants: None Review of the MIP images confirms the above findings IMPRESSION: 1. Negative CT head. 2. No significant carotid or vertebral artery stenosis in the neck 3. Moderate right posterior cerebral artery stenosis and mild stenosis left posterior cerebral artery. Anterior circulation widely patent. Electronically Signed: By: Marlan Palau M.D. On: 01/21/2019 09:56   CT HEAD WO CONTRAST  Result Date: 01/20/2019 CLINICAL DATA:  Left-sided numbness EXAM: CT HEAD WITHOUT CONTRAST TECHNIQUE: Contiguous axial images were obtained from the base of the skull through the vertex without intravenous contrast. COMPARISON:  None. FINDINGS: Brain: No evidence of acute infarction, hemorrhage, hydrocephalus, extra-axial collection or mass lesion/mass effect. Vascular: Negative for hyperdense vessel Skull: Negative Sinuses/Orbits: Negative Other: None IMPRESSION: Negative CT head Electronically Signed   By: Marlan Palau M.D.   On: 01/20/2019 08:06   CT ANGIO NECK W OR WO CONTRAST  Addendum Date: 01/21/2019   ADDENDUM REPORT: 01/21/2019 13:03 ADDENDUM: Enlargement left lobe of thyroid with ill-defined low-density area. Recommend thyroid ultrasound to rule out nodule. Electronically Signed   By: Marlan Palau M.D.   On: 01/21/2019 13:03   Result Date: 01/21/2019 CLINICAL DATA:  Stroke.  Left-sided numbness. EXAM: CT ANGIOGRAPHY HEAD AND NECK TECHNIQUE: Multidetector CT imaging of the head and neck was performed using the standard protocol during bolus administration of intravenous contrast. Multiplanar CT image reconstructions and MIPs were obtained to evaluate the vascular  anatomy. Carotid stenosis measurements (when applicable) are obtained utilizing NASCET criteria, using the distal internal carotid diameter as the denominator. CONTRAST:  26mL OMNIPAQUE IOHEXOL 350 MG/ML SOLN COMPARISON:  CT head 01/20/2019 FINDINGS: CT HEAD FINDINGS Brain: No evidence of acute infarction, hemorrhage, hydrocephalus, extra-axial collection or mass lesion/mass effect. Vascular: Negative for hyperdense vessel Skull: Negative Sinuses: Negative Orbits: None Review of the MIP images confirms the above findings CTA NECK FINDINGS Aortic arch: Standard branching. Imaged portion shows no evidence of aneurysm or dissection. No significant stenosis of the major arch vessel origins. Right carotid system: Normal right carotid system. Negative for stenosis or dissection. Left carotid system: Normal left carotid system. Mild atherosclerotic disease proximal left internal carotid artery due to noncalcified plaque. Less than 25% diameter stenosis. Vertebral arteries: Left vertebral artery dominant. Both vertebral arteries patent to the basilar without stenosis. Skeleton: No acute skeletal abnormality. Other neck: Enlargement of the left lobe of the thyroid with ill-defined low-density area in the left lobe. Well-defined cyst in the left isthmus measuring 7 mm. Tiny cyst right lobe of thyroid. Upper chest: Negative Review of the MIP images confirms the above findings CTA HEAD FINDINGS Anterior circulation: Mild atherosclerotic calcification cavernous carotid bilaterally without stenosis or aneurysm. Posterior circulation: Both vertebral arteries patent to the basilar. Left vertebral artery dominant. PICA patent bilaterally. Basilar widely patent.  Superior cerebellar and posterior cerebral arteries patent bilaterally. Fetal origin right posterior cerebral artery. Moderate stenosis right P2 segment.  Mild disease left P2 segment. Venous sinuses: Normal venous enhancement. Anatomic variants: None Review of the MIP images  confirms the above findings IMPRESSION: 1. Negative CT head. 2. No significant carotid or vertebral artery stenosis in the neck 3. Moderate right posterior cerebral artery stenosis and mild stenosis left posterior cerebral artery. Anterior circulation widely patent. Electronically Signed: By: Marlan Palau M.D. On: 01/21/2019 09:56   ECHOCARDIOGRAM COMPLETE  Result Date: 01/20/2019   ECHOCARDIOGRAM REPORT   Patient Name:   RAWAD BOCHICCHIO Date of Exam: 01/20/2019 Medical Rec #:  [id number]        Height:       71.0 in Accession #:    [id number]       Weight:       327.8 lb Date of Birth:  1960-08-27       BSA:          2.60 m Patient Age:    58 years         BP:           156/94 mmHg Patient Gender: M                HR:           70 bpm. Exam Location:  Inpatient Procedure: 2D Echo Indications:    stroke  History:        Patient has no prior history of Echocardiogram examinations.                 Chronic kidney disease; Risk Factors:Hypertension and Diabetes.  Sonographer:    Delcie Roch Referring Phys: [id number] DAN FLOYD IMPRESSIONS  1. Left ventricular ejection fraction, by visual estimation, is 60 to 65%. The left ventricle has normal function. There is no left ventricular hypertrophy.  2. The left ventricle has no regional wall motion abnormalities.  3. Global right ventricle has normal systolic function.The right ventricular size is normal. No increase in right ventricular wall thickness.  4. Left atrial size was normal.  5. Right atrial size was normal.  6. The mitral valve is normal in structure. No evidence of mitral valve regurgitation. No evidence of mitral stenosis.  7. The tricuspid valve is normal in structure.  8. The aortic valve is normal in structure. Aortic valve regurgitation is not visualized. Mild to moderate aortic valve sclerosis/calcification without any evidence of aortic stenosis.  9. The pulmonic valve was normal in structure. Pulmonic valve regurgitation is trivial. 10. The  inferior vena cava is normal in size with greater than 50% respiratory variability, suggesting right atrial pressure of 3 mmHg. FINDINGS  Left Ventricle: Left ventricular ejection fraction, by visual estimation, is 60 to 65%. The left ventricle has normal function. The left ventricle has no regional wall motion abnormalities. There is no left ventricular hypertrophy. Normal left atrial pressure. Right Ventricle: The right ventricular size is normal. No increase in right ventricular wall thickness. Global RV systolic function is has normal systolic function. Left Atrium: Left atrial size was normal in size. Right Atrium: Right atrial size was normal in size Pericardium: There is no evidence of pericardial effusion. Mitral Valve: The mitral valve is normal in structure. No evidence of mitral valve regurgitation. No evidence of mitral valve stenosis by observation. Tricuspid Valve: The tricuspid valve is normal in structure. Tricuspid valve regurgitation is not demonstrated. Aortic Valve: The aortic valve is normal in  structure.. There is moderate thickening and moderate calcification of the aortic valve. Aortic valve regurgitation is not visualized. Mild to moderate aortic valve sclerosis/calcification is present, without any evidence of aortic stenosis. There is moderate thickening of the aortic valve. There is moderate calcification of the aortic valve. Pulmonic Valve: The pulmonic valve was normal in structure. Pulmonic valve regurgitation is trivial. Pulmonic regurgitation is trivial. Aorta: The aortic root, ascending aorta and aortic arch are all structurally normal, with no evidence of dilitation or obstruction. Venous: The inferior vena cava is normal in size with greater than 50% respiratory variability, suggesting right atrial pressure of 3 mmHg. IAS/Shunts: No atrial level shunt detected by color flow Doppler. There is no evidence of a patent foramen ovale. No ventricular septal defect is seen or detected.  There is no evidence of an atrial septal defect.  LEFT VENTRICLE PLAX 2D LVIDd:         4.60 cm  Diastology LVIDs:         3.10 cm  LV e' lateral:   11.50 cm/s LV PW:         1.10 cm  LV E/e' lateral: 6.9 LV IVS:        1.10 cm  LV e' medial:    9.57 cm/s LVOT diam:     2.20 cm  LV E/e' medial:  8.3 LV SV:         59 ml LV SV Index:   21.20 LVOT Area:     3.80 cm  RIGHT VENTRICLE RV S prime:     14.10 cm/s TAPSE (M-mode): 2.3 cm LEFT ATRIUM             Index       RIGHT ATRIUM           Index LA diam:        3.90 cm 1.50 cm/m  RA Area:     11.60 cm LA Vol (A2C):   48.5 ml 18.64 ml/m RA Volume:   23.40 ml  9.00 ml/m LA Vol (A4C):   43.8 ml 16.84 ml/m LA Biplane Vol: 47.4 ml 18.22 ml/m  AORTIC VALVE LVOT Vmax:   116.00 cm/s LVOT Vmean:  76.900 cm/s LVOT VTI:    0.243 m  AORTA Ao Root diam: 3.00 cm MITRAL VALVE MV Area (PHT): 4.39 cm             SHUNTS MV PHT:        50.17 msec           Systemic VTI:  0.24 m MV Decel Time: 173 msec             Systemic Diam: 2.20 cm MV E velocity: 79.70 cm/s 103 cm/s MV A velocity: 72.80 cm/s 70.3 cm/s MV E/A ratio:  1.09       1.5  Donato Schultz MD Electronically signed by Donato Schultz MD Signature Date/Time: 01/20/2019/3:48:40 PM    Final    VAS US CAROTID  Result Date: 01/20/2019 Carotid Arterial Duplex Study Indications:       CVA. Risk Factors:      Hypertension, hyperlipidemia, Diabetes. Comparison Study:  no prior Performing Technologist: Blanch Media RVS  Examination Guidelines: A complete evaluation includes B-mode imaging, spectral Doppler, color Doppler, and power Doppler as needed of all accessible portions of each vessel. Bilateral testing is considered an integral part of a complete examination. Limited examinations for reoccurring indications may be performed as noted.  Right Carotid Findings: +----------+--------+--------+--------+------------------+--------+  PSV cm/sEDV cm/sStenosisPlaque DescriptionComments  +----------+--------+--------+--------+------------------+--------+ CCA Prox  85      12              heterogenous               +----------+--------+--------+--------+------------------+--------+ CCA Distal53      17              heterogenous               +----------+--------+--------+--------+------------------+--------+ ICA Prox  50      13      1-39%   heterogenous               +----------+--------+--------+--------+------------------+--------+ ICA Distal40      8                                          +----------+--------+--------+--------+------------------+--------+ ECA       78      6                                          +----------+--------+--------+--------+------------------+--------+ +----------+--------+-------+--------+-------------------+           PSV cm/sEDV cmsDescribeArm Pressure (mmHG) +----------+--------+-------+--------+-------------------+ [id number]                                         +----------+--------+-------+--------+-------------------+ +---------+--------+--+--------+--+---------+ VertebralPSV cm/s27EDV cm/s11Antegrade +---------+--------+--+--------+--+---------+  Left Carotid Findings: +----------+--------+--------+--------+------------------+--------+           PSV cm/sEDV cm/sStenosisPlaque DescriptionComments +----------+--------+--------+--------+------------------+--------+ CCA Prox  149     25              heterogenous               +----------+--------+--------+--------+------------------+--------+ CCA Distal77      27              heterogenous               +----------+--------+--------+--------+------------------+--------+ ICA Prox  67      24      1-39%   heterogenous               +----------+--------+--------+--------+------------------+--------+ ICA Distal70      24                                          +----------+--------+--------+--------+------------------+--------+ ECA       111     11                                         +----------+--------+--------+--------+------------------+--------+ +----------+--------+--------+--------+-------------------+           PSV cm/sEDV cm/sDescribeArm Pressure (mmHG) +----------+--------+--------+--------+-------------------+ [id number]                                         +----------+--------+--------+--------+-------------------+ +---------+--------+--------+--------------+ VertebralPSV cm/sEDV cm/sNot identified +---------+--------+--------+--------------+  Summary: Right Carotid: Velocities in the right ICA are consistent with a 1-39% stenosis. Left  Carotid: Velocities in the left ICA are consistent with a 1-39% stenosis. Vertebrals: Right vertebral artery demonstrates antegrade flow. Left vertebral             artery was not visualized. *See table(s) above for measurements and observations.     Preliminary     PHYSICAL EXAM  Temp:  [97.7 F (36.5 C)-98.7 F (37.1 C)] 98.7 F (37.1 C) (01/12 1635) Pulse Rate:  [69-81] 77 (01/12 1635) Resp:  [17-20] 20 (01/12 1635) BP: (108-173)/(87-140) 153/90 (01/12 1635) SpO2:  [96 %-100 %] 96 % (01/12 1635)  General - Well nourished, well developed, in no apparent distress.  Ophthalmologic - fundi not visualized due to noncooperation.  Cardiovascular - Regular rhythm and rate.  Mental Status -  Level of arousal and orientation to time, place, and person were intact. Language including expression, naming, repetition, comprehension was assessed and found intact. Fund of Knowledge was assessed and was intact.  Cranial Nerves II - XII - II - Visual field intact OU. III, IV, VI - Extraocular movements intact. V - Facial sensation intact bilaterally. VII - Facial movement intact bilaterally. VIII - Hearing & vestibular intact bilaterally. X - Palate elevates symmetrically. XI -  Chin turning & shoulder shrug intact bilaterally. XII - Tongue protrusion intact.  Motor Strength - The patient's strength was normal in all extremities and pronator drift was absent.  Bulk was normal and fasciculations were absent.   Motor Tone - Muscle tone was assessed at the neck and appendages and was normal.  Reflexes - The patient's reflexes were symmetrical in all extremities and he had no pathological reflexes.  Sensory - Light touch, temperature/pinprick were assessed and were symmetrical.    Coordination - The patient had normal movements in the hands and feet with no ataxia or dysmetria.  Tremor was absent.  Gait and Station - deferred.   ASSESSMENT/PLAN Mr. Gildardo PoundsMichael Stmartin is a 59 y.o. male with history of HTN, HLD, DB presenting with progressive L arm tingling to progressed to weakness then resolved except for the tingling.   R brain TIA vs. Small infarct - ? Right PCA territory ? Could be dure to right PCA stenosis ?   CT head Unremarkable  CTA head & neck no significant carotid or VA stenosis. Moderate R PCA and mild L PCA stenosis. Enlargement L thyroid lobe.    Carotid Doppler  B ICA 1-39% stenosis, VAs antegrade   2D Echo EF 60-65%. No source of embolus   LDL 85  HgbA1c 7.0  Lovenox 40 mg sq daily for VTE prophylaxis  No antithrombotic prior to admission, now on aspirin 81 mg daily and clopidogrel 75 mg daily for 3 weeks and then ASA alone.  Therapy recommendations:  No therapy needs  Disposition:  Return home  Hypertension  On the high side 150-160s  Gradually normalize BP in 3-5 days . BP goal normotensive  Hyperlipidemia  Home meds:  lipitor 10  Now on lipitor 80  LDL 85, goal < 70  Continue statin at discharge  Diabetes type II Controlled  HgbA1c 7.0, goal < 7.0  CBGs  SSI  PCP follow up closely  Other Stroke Risk Factors  Morbid Obesity, There is no height or weight on file to calculate BMI., recommend weight loss, diet and  exercise as appropriate   Other Active Problems  CKD stage IIIa, Cre 1.52->1.47->1.40  Hospital day # 0  Neurology will sign off. Please call with questions. Pt will follow up with stroke clinic  NP at [facility] in about 4 weeks. Thanks for the consult.  Rosalin Hawking, MD PhD Stroke Neurology 01/21/2019 5:09 PM   To contact Stroke Continuity provider, please refer to [email or web address] After hours, contact General Neurology

## 2019-01-22 DIAGNOSIS — N1831 Chronic kidney disease, stage 3a: Secondary | ICD-10-CM

## 2019-01-22 LAB — BASIC METABOLIC PANEL
Anion gap: 8 (ref 5–15)
BUN: 15 mg/dL (ref 6–20)
CO2: 27 mmol/L (ref 22–32)
Calcium: 9 mg/dL (ref 8.9–10.3)
Chloride: 103 mmol/L (ref 98–111)
Creatinine, Ser: 1.48 mg/dL — ABNORMAL HIGH (ref 0.61–1.24)
GFR calc Af Amer: 59 mL/min — ABNORMAL LOW (ref >60)
GFR calc non Af Amer: 51 mL/min — ABNORMAL LOW (ref >60)
Glucose, Bld: 118 mg/dL — ABNORMAL HIGH (ref 70–99)
Potassium: 4.3 mmol/L (ref 3.5–5.1)
Sodium: 138 mmol/L (ref 135–145)

## 2019-01-22 LAB — GLUCOSE, CAPILLARY
Glucose-Capillary: 113 mg/dL — ABNORMAL HIGH (ref 70–99)
Glucose-Capillary: 123 mg/dL — ABNORMAL HIGH (ref 70–99)

## 2019-01-22 MED ORDER — CLOPIDOGREL BISULFATE 75 MG PO TABS: 75.0000 mg | ORAL_TABLET | Freq: Every day | ORAL | 0 refills | Status: DC

## 2019-01-22 MED ORDER — METFORMIN HCL 500 MG PO TABS: 1000.0000 mg | ORAL_TABLET | Freq: Two times a day (BID) | ORAL | 1 refills | Status: DC

## 2019-01-22 MED ORDER — ATORVASTATIN CALCIUM 80 MG PO TABS: 80.0000 mg | ORAL_TABLET | Freq: Every day | ORAL | 1 refills | Status: DC

## 2019-01-22 MED ORDER — ASPIRIN 81 MG PO TBEC: 81.0000 mg | DELAYED_RELEASE_TABLET | Freq: Every day | ORAL | 1 refills | Status: DC

## 2019-01-22 MED FILL — ?METFORMIN HCL 500MG TABLET: 500 | 30 days supply | Qty: 120 | Fill #0

## 2019-01-22 MED FILL — ATORVASTATIN 80 MG TABLET: 80 | 30 days supply | Qty: 30 | Fill #0

## 2019-01-22 MED FILL — CLOPIDOGREL 75 MG TABLET: 75 | 19 days supply | Qty: 19 | Fill #0

## 2019-01-22 NOTE — Progress Notes (Signed)
Patient being discharged home, transportation provided by wife. Educated patient on discharge instructions, questions answered. Belongings returned to patient.

## 2019-01-22 NOTE — Discharge Summary (Signed)
Name: Lonnie Brown MRN: [id number] DOB: 18-Oct-1960 59 y.o. PCP: Marcine Matar, MD  Date of Admission: 01/20/2019  7:17 AM Date of Discharge: 01/22/2019 Attending Physician: Dr. Heide Spark  Discharge Diagnosis: 1. TIA 2. Hypertension  3. Type 2 Diabetes Mellitus  4. CKD Stage 3a  Discharge Medications: Allergies as of 01/22/2019      Reactions   Lisinopril Other (See Comments)   Angioedema   Banana Nausea And Vomiting      Medication List    STOP taking these medications   hydrochlorothiazide 12.5 MG tablet Commonly known as: HYDRODIURIL     TAKE these medications   amLODipine 10 MG tablet Commonly known as: NORVASC Take 1 tablet (10 mg total) by mouth daily.   aspirin 81 MG EC tablet Take 1 tablet (81 mg total) by mouth daily.   atorvastatin 80 MG tablet Commonly known as: LIPITOR Take 1 tablet (80 mg total) by mouth daily at 6 PM. What changed:   medication strength  how much to take  when to take this   clopidogrel 75 MG tablet Commonly known as: PLAVIX Take 1 tablet (75 mg total) by mouth daily.   metFORMIN 500 MG tablet Commonly known as: GLUCOPHAGE Take 2 tablets (1,000 mg total) by mouth 2 (two) times daily with a meal. What changed: when to take this   sildenafil 50 MG tablet Commonly known as: Viagra 1 tab half hour to 1 hour before sexual intercourse.  Limit to 1 tablet per 24-hour.       Disposition and follow-up:   [name] was discharged from [facility] in Good condition.  At the hospital follow up visit please address:  1.  Please ensure Neurology follow up  Please repeat BMP to ensure no contrast induced nephropathy  Will need thyroid ultrasound for thyroid enlargement, per radiology recommendations   2.  Labs / imaging needed at time of follow-up: BMP  3.  Pending labs/ test needing follow-up: None  Follow-up Appointments: Follow-up Information    [facility]. Schedule  an appointment as soon as possible for a visit in 4 week(s).   Specialty: Neurology Contact information: [address] [place] [address] [phone]       Marcine Matar, MD. Schedule an appointment as soon as possible for a visit in 5 day(s).   Specialty: Internal Medicine Contact information: [address] [place] [address] [phone]        [facility] Follow up.   Specialty: Rehabilitation Why: The outpatient therapy will contact you for the first appointment Contact information: [address] [place], [place] [address] [id number] mc [place] [address] [phone]          Hospital Course by problem list: 1. TIA Patient presented to the ED with complaints of left upper extremity and lower extremity numbness.  Initial CT head without contrast was negative.  Patient denied MRI secondary to claustrophobia.  CT head and neck with contrast pain, per neurology recommendations, which was negative for acute findings of stroke.  Carotid ultrasounds were negative.  TTE was negative. Lipid panel with LDL of 85 and HDL of 30. Long term goal LDL is < 70 Statin dose was increased from atorvastatin 10 mg to 80 mg.  Patient was started on Plavix for 3 weeks, as well as aspirin to continue daily.  Neurology recommended follow-up in 4 weeks.  2. Hypertension  Initially held home amlodipine to allow for permissive hypertension,  however restarted on discharge.  3. Type 2 Diabetes Mellitus  A1c of 7.0.  Patient was not taking any home medication at this time.  Started back on Metformin on discharge.  4. CKD Stage 3a Creatinine at baseline approximately 1.4.  Prior to receiving contrast for CT, patient received fluids to prevent contrast-induced nephropathy.  Recommend repeat BMP on hospital follow-up to ensure creatinine has remained stable.  Discharge Vitals:   BP (!) 160/111 (BP Location: Left Arm)    Pulse 72   Temp 98.6 F (37 C) (Oral)   Resp 18   SpO2 100%   Pertinent Labs, Studies, and Procedures:  CBC Latest Ref Rng & Units 01/20/2019 01/20/2019 09/02/2018  WBC 4.0 - 10.5 K/uL - 6.7 7.8  Hemoglobin 13.0 - 17.0 g/dL 69.4 85.4 62.7  Hematocrit 39.0 - 52.0 % 42.0 42.6 39.6  Platelets 150 - 400 K/uL - 251 272   BMP Latest Ref Rng & Units 01/22/2019 01/20/2019 01/20/2019  Glucose 70 - 99 mg/dL 035(K) 093(G) 182(X)  BUN 6 - 20 mg/dL 15 17 16   Creatinine 0.61 - 1.24 mg/dL ) 9.37(J) 6.96(V)  BUN/Creat Ratio 9 - 20 - - -  Sodium 135 - 145 mmol/L 138 139 138  Potassium 3.5 - 5.1 mmol/L 4.3 4.1 4.1  Chloride 98 - 111 mmol/L 103 104 105  CO2 22 - 32 mmol/L 27 - 27  Calcium 8.9 - 10.3 mg/dL 9.0 - 9.2   Lipid Panel     Component Value Date/Time   CHOL 129 01/20/2019 1330   CHOL 131 09/02/2018 1645   TRIG 68 01/20/2019 1330   HDL 30 (L) 01/20/2019 1330   HDL 32 (L) 09/02/2018 1645   CHOLHDL 4.3 01/20/2019 1330   VLDL 14 01/20/2019 1330   LDLCALC 85 01/20/2019 1330   LDLCALC 73 09/02/2018 1645   LABVLDL 26 09/02/2018 1645   TTE: 01/20/2019 IMPRESSIONS  1. Left ventricular ejection fraction, by visual estimation, is 60 to 65%. The left ventricle has normal function. There is no left ventricular hypertrophy.  2. The left ventricle has no regional wall motion abnormalities.  3. Global right ventricle has normal systolic function.The right ventricular size is normal. No increase in right ventricular wall thickness.  4. Left atrial size was normal.  5. Right atrial size was normal.  6. The mitral valve is normal in structure. No evidence of mitral valve regurgitation. No evidence of mitral stenosis.  7. The tricuspid valve is normal in structure.  8. The aortic valve is normal in structure. Aortic valve regurgitation is not visualized. Mild to moderate aortic valve sclerosis/calcification without any evidence of aortic stenosis.  9. The pulmonic valve was normal in structure.  Pulmonic valve regurgitation is trivial. 10. The inferior vena cava is normal in size with greater than 50% respiratory variability, suggesting right atrial pressure of 3 mmHg.  Carotid Ultrasound B/L: 01/20/2019 Summary: Right Carotid: Velocities in the right ICA are consistent with a 1-39% stenosis. Left Carotid: Velocities in the left ICA are consistent with a 1-39% stenosis. Vertebrals: Right vertebral artery demonstrates antegrade flow. Left vertebral artery was not visualized.  CT Head W/o Contrast: 01/20/2019 IMPRESSION: Negative CT head  CT Head/Neck Angio: 01/21/2019 IMPRESSION: 1. Negative CT head. 2. No significant carotid or vertebral artery stenosis in the neck 3. Moderate right posterior cerebral artery stenosis and mild stenosis left posterior cerebral artery. Anterior circulation widely patent.  ADDENDUM: Enlargement left lobe of thyroid with ill-defined low-density area. Recommend thyroid ultrasound to rule out  nodule.  Discharge Instructions: Discharge Instructions    Ambulatory referral to Neurology   Complete by: As directed    Follow up with stroke clinic NP (Jessica Vanschaick or Cecille Rubin, if both not available, consider Zachery Dauer, or Ahern) at [facility] in about 4 weeks. Thanks.   Ambulatory referral to Physical Therapy   Complete by: As directed    Call MD for:   Complete by: As directed    Worsening muscle weakness or numbness Slurring of speech Facial drooping   Call MD for:  difficulty breathing, headache or visual disturbances   Complete by: As directed    Call MD for:  persistant dizziness or light-headedness   Complete by: As directed    Call MD for:  severe uncontrolled pain   Complete by: As directed    Call MD for:  temperature >100.4   Complete by: As directed    Diet - low sodium heart healthy   Complete by: As directed    Discharge instructions   Complete by: As directed    Mr. Difrancesco,   It was a pleasure taking care of you  during your hospitalization. You were admitted for a stroke work up that included imaging of your head. The scans were negative for stroke, so your symptoms are likely from TIA, commonly called a "mini stroke." To reduce the risk of this happening again in the future, please take all your medications daily. It will be important to control your blood pressure, diabetes and cholesterol.   In addition, we recommend you follow up with your primary care doctor for repeat blood work early next week.   We wish you the best!   Increase activity slowly   Complete by: As directed       Signed: Dr. Jose Persia Internal Medicine PGY-1  Pager: [phone] 01/27/2019, 12:46 PM

## 2019-01-22 NOTE — TOC Transition Note (Addendum)
Transition of Care [facility]) - CM/SW Discharge Note   Patient Details  Name: Sarkis Rhines MRN: [id number] Date of Birth: Jun 02, 1960  [facility] CM/SW Contact:  Pollie Friar, RN Phone Number: 01/22/2019, 10:46 AM   Clinical Narrative:    Pt discharging home with self care. No f/u per PT/OT. Cane delivered to the room. Pt follows with [facility] for PCP and medications.  Pt has transportation home.   Addendum (1156): PT recommending outpatient therapy after patients fall last night. CM met with him and he asked to atttend outpatient at [facility]. Orders in Epic and information on the AVS.   Final next level of care: Home/Self Care Barriers to Discharge: No Barriers Identified   Patient Goals and CMS Choice        Discharge Placement                       Discharge Plan and Services   Discharge Planning Services: CM Consult            DME Arranged: Kasandra Knudsen DME Agency: [employer] Date DME Agency Contacted: 01/21/19   Representative spoke with at DME Agency: [email or web address]            Social Determinants of Health ([place]) Interventions     Readmission Risk Interventions No flowsheet data found.

## 2019-01-22 NOTE — Progress Notes (Signed)
   Subjective:   Mr. Crist reports continued numbness in his left hand and lip area but notes some improvement. He is concerned about driving and we discussed waiting a few days and then carefully starting to drive again. He denies any visual deficits.   Discussed importance of compliance with medication to decrease risk factors for future CVA events. He expressed understanding. All questions and concerns addressed.   Objective:  Vital signs in last 24 hours: Vitals:   01/21/19 2030 01/22/19 0003 01/22/19 0420 01/22/19 0427  BP: (!) 169/119 (!) 159/95 (!) 161/92 (!) 161/92  Pulse: 80 74  73  Resp: 19     Temp: 98.8 F (37.1 C) 97.7 F (36.5 C) 97.9 F (36.6 C) 97.9 F (36.6 C)  TempSrc: Oral Oral Oral Oral  SpO2: 100% 100% 99% 99%   General: pleasant gentleman, sitting up in bed eating breakfast, NAD CV: RRR; no m/r/g Pulm: normal work of breathing; lungs CTAB Neuro: Strength is 5/5 in bilateral upper and lower extremities. Sensation grossly intact. Examination stable from previous day  Assessment/Plan:  Active Problems:   CVA (cerebral vascular accident) [facility])  Mr. Fayez Sturgell is a 59 y/o male with a PMHx of T2DM, CKD stage 3, HTN, HLD who presents to the ED with c/o left sided weakness currently being admitted for TIA/CVA work up.   # Left-sided Numbness:  Likely TIA, given no findings of stroke on imaging.   - Aspirin 81 mg QD - Plavix for 3 weeks  - Neurology has signed off with recommendations to follow up in 1 month  # Type 2 Diabetes:  - Plan to restart Metformin 1000 mg BID on discharge   # Hypertension: -Restart home Amlodipine today  # Hyperlipidemia:  - Atorvastatin 80 mg QD   # CKD Stage 3:  Creatinine stable. Recommend outpatient follow up of labs.    Dispo: Anticipated discharge today.    Dr. Verdene Lennert Internal Medicine PGY-1  Pager: [phone] 01/22/2019, 6:41 AM

## 2019-01-22 NOTE — Progress Notes (Signed)
Physical Therapy Treatment Patient Details Name: Lonnie Brown MRN: [id number] DOB: 05-25-1960 Today's Date: 01/22/2019    History of Present Illness 59 year old male with a past medical history of hypertension, diabetes and high cholesterol, presenting to the [facility] ED with new onset of left arm weakness, left arm tingling and left leg weakness. CT head was negative. Pt is claustrophobic and is not comfortable with MRI at this time.     PT Comments    Pt progressing well towards their physical therapy goals. Ambulating 600 feet with a cane at a supervision level. Session focused on dynamic balance activities, which pt performed without a gross loss of balance. Continues with left lower extremity instability and combined with recent inpatient fall, updated recommendation to OPPT to address deficits and maximize functional independence. 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     Follow Up Recommendations  Outpatient PT     Equipment Recommendations  Cane    Recommendations for Other Services       Precautions / Restrictions Precautions Precautions: Fall Precaution Comments: moderate fall risk Restrictions Weight Bearing Restrictions: No    Mobility  Bed Mobility Overal bed mobility: Modified Independent             General bed mobility comments: OOB in chair  Transfers Overall transfer level: Needs assistance Equipment used: None Transfers: Sit to/from Stand Sit to Stand: Supervision         General transfer comment: minguard for initial sit<>stand due to L knee weakness, pt able to self correct   Ambulation/Gait Ambulation/Gait assistance: Supervision Gait Distance (Feet): 600 Feet Assistive device: Straight cane Gait Pattern/deviations: Decreased weight shift to left;Step-through pattern Gait velocity: decreased   General Gait Details: cues for shorter step length to improve  control. noted left knee instability but no buckle   Stairs             Wheelchair Mobility    Modified Rankin (Stroke Patients Only) Modified Rankin (Stroke Patients Only) Pre-Morbid Rankin Score: No symptoms Modified Rankin: Moderately severe disability     Balance Overall balance assessment: Needs assistance Sitting-balance support: No upper extremity supported;Feet unsupported Sitting balance-Leahy Scale: Good     Standing balance support: Single extremity supported;During functional activity Standing balance-Leahy Scale: Fair Standing balance comment: reliant on spc for stability during ambulation, demonstrates preference for single UE support during functional mobility to increase stability                            Cognition Arousal/Alertness: Awake/alert Behavior During Therapy: WFL for tasks assessed/performed Overall Cognitive Status: Within Functional Limits for tasks assessed                                 General Comments: pt required cues for use of sock aide to assist with LB dressing, appeared to have decreased carry over from yesterday's session;able to provide 1 energy conservation strategy      Exercises Other Exercises Other Exercises: Dynamic balance including stops/starts, varying gait speed, pivot turns, head turns, stepping over/around obstacles    General Comments General comments (skin integrity, edema, etc.): per RN and chart, pt with fall 1/11;during initial evaluation pt did not report any falls when asked;educated pt on fall prevention strategies with provided handout      Pertinent Vitals/Pain Pain Assessment: No/denies pain  Pain Intervention(s): Monitored during session    Home Living                      Prior Function            PT Goals (current goals can now be found in the care plan section) Acute Rehab PT Goals Patient Stated Goal: to return to prior level Potential to Achieve Goals:  Good Progress towards PT goals: Progressing toward goals    Frequency    Min 3X/week      PT Plan Discharge plan needs to be updated    Co-evaluation              AM-PAC PT "6 Clicks" Mobility   Outcome Measure  Help needed turning from your back to your side while in a flat bed without using bedrails?: None Help needed moving from lying on your back to sitting on the side of a flat bed without using bedrails?: None Help needed moving to and from a bed to a chair (including a wheelchair)?: None Help needed standing up from a chair using your arms (e.g., wheelchair or bedside chair)?: None Help needed to walk in hospital room?: A Little Help needed climbing 3-5 steps with a railing? : A Little 6 Click Score: 22    End of Session Equipment Utilized During Treatment: Gait belt Activity Tolerance: Patient tolerated treatment well Patient left: in chair;with call bell/phone within reach Nurse Communication: Mobility status PT Visit Diagnosis: Unsteadiness on feet (R26.81);Other abnormalities of gait and mobility (R26.89);Muscle weakness (generalized) (M62.81);Other symptoms and signs involving the nervous system (R29.898)     Time: 3790-2409 PT Time Calculation (min) (ACUTE ONLY): 19 min  Charges:  $Therapeutic Activity: 8-22 mins                     Ellamae Sia, PT, DPT Acute Rehabilitation Services Pager [phone] Office [phone]    Willy Eddy 01/22/2019, 1:49 PM

## 2019-01-22 NOTE — Progress Notes (Signed)
Occupational Therapy Treatment Patient Details Name: Lonnie Brown MRN: [id number] DOB: [birth year] Today's Date: 01/22/2019    History of present illness 59 year old male with a past medical history of hypertension, diabetes and high cholesterol, presenting to the [facility] ED with new onset of left arm weakness, left arm tingling and left leg weakness. CT head was negative. Pt is claustrophobic and is not comfortable with MRI at this time.    OT comments  Pt received in bed, agreeable to OT session. Per RN and chart, pt with fall on 1/11, during initial evaluation pt did not report hx of falls when prompted. Pt currently demonstrates decreased carryover from yesterday's session, requiring 1 vc for use of sock aide vs reacher to assist with LB dressing. He was able to state 1 energy conservation strategy. Pt required minguard during initial standing due to slight L knee buckling and minguard during functional mobility during ADL for proper and safe use of cane. Recommend PT return to assess high level balance and safe use of AD. Provided pt with education covering energy conservation, fall prevention strategies, use of AE to assist with LB dressing, back precautions to assist with back pain prevention. Pt reports he has been reading and reviewing all handouts. Pt will continue to benefit from skilled OT services to maximize safety and independence with ADL/IADL and functional mobility. Will continue to follow acutely and progress as tolerated.    Follow Up Recommendations  No OT follow up    Equipment Recommendations  None recommended by OT    Recommendations for Other Services      Precautions / Restrictions Precautions Precautions: Fall Precaution Comments: moderate fall risk Restrictions Weight Bearing Restrictions: No       Mobility Bed Mobility Overal bed mobility: Modified Independent             General bed mobility comments: increased effort, increased doe 2/4 with bed  mobility;continued to reinforce bed mobility strategies for back pain prevention  Transfers Overall transfer level: Needs assistance Equipment used: (spc) Transfers: Sit to/from Stand Sit to Stand: Min guard         General transfer comment: minguard for initial sit<>stand due to L knee weakness, pt able to self correct     Balance Overall balance assessment: Needs assistance Sitting-balance support: No upper extremity supported;Feet unsupported Sitting balance-Leahy Scale: Good     Standing balance support: Single extremity supported;During functional activity Standing balance-Leahy Scale: Fair Standing balance comment: reliant on spc for stability during ambulation, demonstrates preference for single UE support during functional mobility to increase stability                           ADL either performed or assessed with clinical judgement   ADL Overall ADL's : Needs assistance/impaired     Grooming: Supervision/safety Grooming Details (indicate cue type and reason): vc for energy conservation strategies     Lower Body Bathing: Modified independent   Upper Body Dressing : Modified independent   Lower Body Dressing: Modified independent Lower Body Dressing Details (indicate cue type and reason): use of AE to don socks Toilet Transfer: Min guard;Ambulation Toilet Transfer Details (indicate cue type and reason): safe use of SPC Toileting- Clothing Manipulation and Hygiene: Min guard;Sit to/from stand Toileting - Clothing Manipulation Details (indicate cue type and reason): stabiltiy and safety, LLE slight buckle     Functional mobility during ADLs: Min guard;Cane General ADL Comments: required increased time and processing for use  of AE to assist with LB dressing;pt initially grabbed reacher to assist with donning socks, required 1 vc to use sock aide instead but overall able to return demonstrate use of AE     Vision   Vision Assessment?: No apparent  visual deficits Additional Comments: overall pt makes little eye contact   Perception     Praxis      Cognition Arousal/Alertness: Awake/alert Behavior During Therapy: WFL for tasks assessed/performed Overall Cognitive Status: Within Functional Limits for tasks assessed                                 General Comments: pt required cues for use of sock aide to assist with LB dressing, appeared to have decreased carry over from yesterday's session;able to provide 1 energy conservation strategy        Exercises     Shoulder Instructions       General Comments per RN and chart, pt with fall 1/11;during initial evaluation pt did not report any falls when asked;educated pt on fall prevention strategies with provided handout    Pertinent Vitals/ Pain       Pain Assessment: No/denies pain Pain Intervention(s): Monitored during session  Home Living                                          Prior Functioning/Environment              Frequency  Min 2X/week        Progress Toward Goals  OT Goals(current goals can now be found in the care plan section)  Progress towards OT goals: Progressing toward goals  Acute Rehab OT Goals Patient Stated Goal: to return to prior level OT Goal Formulation: With patient Time For Goal Achievement: 02/04/19 Potential to Achieve Goals: Good ADL Goals Pt Will Perform Lower Body Dressing: with modified independence;sit to/from stand;with adaptive equipment Additional ADL Goal #1: Pt will demonstrate independence with 3 energy conservation strategies during ADL completion.  Plan Discharge plan remains appropriate    Co-evaluation                 AM-PAC OT "6 Clicks" Daily Activity     Outcome Measure   Help from another person eating meals?: None Help from another person taking care of personal grooming?: None Help from another person toileting, which includes using toliet, bedpan, or urinal?: A  Little Help from another person bathing (including washing, rinsing, drying)?: None Help from another person to put on and taking off regular upper body clothing?: None Help from another person to put on and taking off regular lower body clothing?: None 6 Click Score: 23    End of Session Equipment Utilized During Treatment: Gait belt(spc)  OT Visit Diagnosis: Other abnormalities of gait and mobility (R26.89);Pain Pain - part of body: (back)   Activity Tolerance Patient tolerated treatment well   Patient Left in chair;with call bell/phone within reach(with PT present)   Nurse Communication Mobility status        Time: 1517-6160 OT Time Calculation (min): 27 min  Charges: OT General Charges $OT Visit: 1 Visit OT Treatments $Self Care/Home Management : 23-37 mins  Dorinda Hill OTR/L Acute Rehabilitation Services Office: [place] 01/22/2019, 11:05 AM

## 2019-01-23 ENCOUNTER — Telehealth: Payer: Self-pay

## 2019-01-23 NOTE — Telephone Encounter (Signed)
Transition Care Management Follow-up Telephone Call  Date of discharge and from where: 01/22/2019, [facility]   How have you been since you were released from the hospital?  He stated that he is feeling " great." Any questions or concerns?  His only concern was that he has been experiencing intermittent  burning of his left hand. Informed him that Dr Laural Benes would be notified of this concern.     Items Reviewed:  Did the pt receive and understand the discharge instructions provided? Yes he has the instructions and has no questions.   Medications obtained and verified? He said that he has all medications and is taking as ordered. No questions and he did not need to review the med list.   Any new allergies since your discharge? None reported   Do you have support at home? Yes, his wife  Other (ie: DME, Home Health, etc) will place to attend outpatient therapy. Has appt at [facility] for PT 01/28/2019 @ 0900  Does not have a glucometer  Has cane to use if needed   Functional Questionnaire: (I = Independent and D = Dependent) ADL's: independent  Follow up appointments reviewed:    PCP Hospital f/u appt confirmed? Appointment scheduled for 02/06/2019 @ 1050 with Dr [facility] f/u appt confirmed? Needs to schedule appt with neurology  Are transportation arrangements needed? No, both he and his wife drive. He is aware that he is supposed to wait a few days before carefully starting to drive again.   If their condition worsens, is the pt aware to call  their PCP or go to the ED? Yes  Was the patient provided with contact information for the PCP's office or ED? He has the phone number for the clinic  Was the pt encouraged to call back with questions or concerns?yes

## 2019-01-28 ENCOUNTER — Encounter: Payer: Self-pay | Admitting: Physical Therapy

## 2019-01-28 ENCOUNTER — Telehealth: Payer: Self-pay

## 2019-01-28 ENCOUNTER — Other Ambulatory Visit: Payer: Self-pay

## 2019-01-28 ENCOUNTER — Ambulatory Visit: Payer: Self-pay | Attending: Internal Medicine | Admitting: Physical Therapy

## 2019-01-28 DIAGNOSIS — I69852 Hemiplegia and hemiparesis following other cerebrovascular disease affecting left dominant side: Secondary | ICD-10-CM | POA: Insufficient documentation

## 2019-01-28 DIAGNOSIS — R262 Difficulty in walking, not elsewhere classified: Secondary | ICD-10-CM | POA: Insufficient documentation

## 2019-01-28 DIAGNOSIS — M6281 Muscle weakness (generalized): Secondary | ICD-10-CM | POA: Insufficient documentation

## 2019-01-28 NOTE — Telephone Encounter (Signed)
Call placed to the patient and informed him that Dr Laural Benes will address the burning in his hand at his appointment next week - 02/06/2019. He said that he understood and noted that the burning has improved.   he also noted that he has papers that need to signed for returning to work .

## 2019-01-28 NOTE — Therapy (Signed)
[facility] [address], [place], [place], [address] Phone: [phone]   Fax:  [phone]  Physical Therapy Evaluation  Patient Details  Name: Lonnie Brown MRN: [id number] Date of Birth: [birth year] Referring Provider (PT): Dr. Aldine Contes    Encounter Date: 01/28/2019  PT End of Session - 01/28/19 2004    Visit Number  1    Date for PT Re-Evaluation  03/25/19    Authorization Type  no insurance    PT Start Time  [phone]   pt late   PT Stop Time  1015    PT Time Calculation (min)  32 min    Activity Tolerance  Patient tolerated treatment well       Past Medical History:  Diagnosis Date  . Diabetes ([place])   . Hypercholesterolemia   . Hypertension     Past Surgical History:  Procedure Laterality Date  . EYE SURGERY    . TONSILLECTOMY      There were no vitals filed for this visit.   Subjective Assessment - 01/28/19 0945    Subjective  CVA ast Monday had numbness in left foot/leg.  Almost fell in shower.  Eventually in left hand too.  Using SPC.  No assistive device before.  Still with numbness in face and left foot.   Sometimes feels like leg feels weak.    Limitations  Walking    How long can you walk comfortably?  house to car    Diagnostic tests  while hospitalized    Patient Stated Goals  get a little stronger mainly in my knee    Currently in Pain?  No/denies    Pain Score  0-No pain         [facility] PT Assessment - 01/28/19 0001      Assessment   Medical Diagnosis  CVA     Referring Provider (PT)  Dr. Aldine Contes     Onset Date/Surgical Date  01/20/19    Hand Dominance  Right    Next MD Visit  next Thursday    Prior Therapy  no      Precautions   Precautions  None      Restrictions   Weight Bearing Restrictions  No      Balance Screen   Has the patient fallen in the past 6 months  Yes    How many times?  1   in the hospital when he tried to stand to use the urinal     Has the patient had a decrease in activity level because of a fear of falling?   No    Is the patient reluctant to leave their home because of a fear of falling?   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#, in/out of vehicle     Leisure  being on the computer; used to work out; travel       ROM / Strength   AROM / PROM / Strength  AROM  UE and LE WFLs     Strength   Overall Strength Comments  distal left UE grossly 4+/5     Right Shoulder Flexion  5/5    Right Shoulder ABduction  5/5    Left Shoulder Flexion  4+/5    Left Shoulder ABduction  4+/5    Right Hand Grip (lbs)  60    Left Hand Grip (lbs)  45    Right Hip Flexion  5/5    Left Hip Flexion  4/5    Right Knee Flexion  5/5    Right Knee Extension  5/5    Left Knee Flexion  4+/5    Left Knee Extension  4+/5    Right Ankle Dorsiflexion  5/5    Right Ankle Plantar Flexion  5/5    Right Ankle Inversion  4+/5    Right Ankle Eversion  4+/5    Left Ankle Dorsiflexion  4+/5    Left Ankle Plantar Flexion  4/5    Left Ankle Inversion  4-/5    Left Ankle Eversion  4-/5      Standardized Balance Assessment   Five times sit to stand comments   13 sec no hands       Berg Balance Test   Sit to Stand  Able to stand without using hands and stabilize independently    Standing Unsupported  Able to stand safely 2 minutes    Sitting with Back Unsupported but Feet Supported on Floor or Stool  Able to sit safely and securely 2 minutes    Stand to Sit  Sits safely with minimal use of hands    Transfers  Able to transfer safely, minor use of hands    Standing  Unsupported with Eyes Closed  Able to stand 10 seconds safely    Standing Unsupported with Feet Together  Able to place feet together independently and stand 1 minute safely    From Standing, Reach Forward with Outstretched Arm  Can reach forward >12 cm safely (5")    From Standing Position, Pick up Object from Floor  Able to pick up shoe safely and easily    From Standing Position, Turn to Look Behind Over each Shoulder  Looks behind from both sides and weight shifts well    Turn 360 Degrees  Able to turn 360 degrees safely in 4 seconds or less    Standing Unsupported, Alternately Place Feet on Step/Stool  Able to stand independently and complete 8 steps >20 seconds    Standing Unsupported, One Foot in Front  Able to plae foot ahead of the other independently and hold 30 seconds    Standing on One Leg  Tries to lift leg/unable to hold 3 seconds but remains standing independently    Total Score  50      Dynamic Gait Index   DGI comment:  to be done next visit       Timed Up and Go Test   Normal TUG (seconds)  9.76   with and without cane                Objective measurements completed on examination: See above findings.                PT Short Term Goals - 01/28/19 2018      PT SHORT TERM GOAL #1   Title  The patient will demonstrate understanding of initial HEP for left sided strengthening and balance    Time  4  Period  Weeks    Status  New    Target Date  02/25/19      PT SHORT TERM GOAL #2   Title  BERG balance score improved to 52/56    Time  4    Period  Weeks    Status  New        PT Long Term Goals - 01/28/19 2020      PT LONG TERM GOAL #1   Title  The patient will be independent in safe self progression of HEP    Time  8    Period  Weeks    Status  New    Target Date  03/25/19      PT LONG TERM GOAL #2   Title  The patient will have improved left UE strength to grossly 5-/5 and grip to 52# of force on left    Time  8    Period   Weeks    Status  New      PT LONG TERM GOAL #3   Title  The patient will be able to lift/carry medium objects as needed for return to work    Time  8    Period  Weeks    Status  New      PT LONG TERM GOAL #4   Title  The patient will be able to walk 500 feet without assistive device and  without loss of balance    Time  8    Period  Weeks    Status  New      PT LONG TERM GOAL #5   Title  LE strength including ankle eversion/inversion grossly 4+/5 needed for return to work    Time  8    Period  Weeks    Status  New      Additional Long Term Goals   Additional Long Term Goals  Yes      PT LONG TERM GOAL #6   Title  BERG balance score improved to 54/56 needed for lower risk of falls    Time  8    Period  Weeks    Status  New             Plan - 01/28/19 2005    Clinical Impression Statement  The patient is 1 week s/p CVA affecting his left side.  He reports continued numbness in his face and left foot.  He is currently ambulating with single point cane although he did not use a device prior to this.  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  His UE and LE ROM is WFLs.  Left UE strength is grossly 4/5 with mild loss of left grip strength.  Left LE strength grossly 4/5 although 4-/5 strength ankle inversion and eversion. BERG balance score is 50/56 indicating lower risk of falls.  5x sit to stand test and Timed up and Go test WFLs.  He would benefit from PT to ensure he is able to return to community ambulation for work, left UE/LE strengthening and further assessment of dynamic balance.    Personal Factors and Comorbidities  Fitness;Comorbidity 1;Comorbidity 2;Comorbidity 3+    Comorbidities  HTN; diabetes, chronic kidney disease; obesity    Examination-Activity Limitations  Lift;Locomotion Level;Carry;Stand    Examination-Participation Restrictions  Community  Activity;Driving;Other;Personal Finances    Stability/Clinical Decision Making  Stable/Uncomplicated    Clinical Decision Making  Low    Rehab Potential  Good    PT Frequency  1x / week    PT Duration  8 weeks    PT Treatment/Interventions  ADLs/Self Care Home Management;Gait training;Functional mobility training;Therapeutic activities;Therapeutic exercise;Balance training;Neuromuscular re-education;Manual techniques;Patient/family education    PT Next Visit Plan  do Dynamic Gait Index;  left grip strengthening;  left ankle inversion and eversion strengthening; initiate HEP for left sided strengthening and balance       Patient will benefit from skilled therapeutic intervention in order to improve the following deficits and impairments:  Difficulty walking, Pain, Impaired UE functional use, Decreased activity tolerance, Impaired perceived functional ability, Decreased balance, Decreased strength  Visit Diagnosis: 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 CVA (cerebral vascular accident) (HCC) 01/20/2019  . Hyperlipidemia associated with type 2 diabetes mellitus (HCC) 12/02/2018  . Chronic midline low back pain without sciatica 09/02/2018  . Controlled type 2 diabetes mellitus without complication, without long-term current use of insulin (HCC) 03/01/2018  . Erectile dysfunction 03/01/2018  . Noncompliance with medications 03/01/2018  . CKD (chronic kidney disease) stage 3, GFR 30-59 ml/min 08/27/2017  . Hyperlipidemia 09/25/2016  . Morbid obesity (HCC) 07/20/2015  . Achilles tendinitis 04/09/2015  . Hypertension 03/04/2015   Lavinia Sharps, PT 01/28/19 8:27  PM Phone: [phone] Fax: [phone] Vivien Presto 01/28/2019, 8:27 PM  [facility] [address] [place], [place], [address] Phone: [phone]   Fax:  [phone]  Name: Krishon Adkison MRN: [id number] Date of Birth: 09-24-1960

## 2019-02-05 ENCOUNTER — Ambulatory Visit: Payer: Self-pay | Admitting: Physical Therapy

## 2019-02-05 ENCOUNTER — Other Ambulatory Visit: Payer: Self-pay

## 2019-02-05 ENCOUNTER — Encounter: Payer: Self-pay | Admitting: Physical Therapy

## 2019-02-05 DIAGNOSIS — M6281 Muscle weakness (generalized): Secondary | ICD-10-CM

## 2019-02-05 DIAGNOSIS — R262 Difficulty in walking, not elsewhere classified: Secondary | ICD-10-CM

## 2019-02-05 NOTE — Therapy (Signed)
[facility] [address], [address] [place], [place], [address] Phone: [phone]   Fax:  [phone]  Physical Therapy Treatment  Patient Details  Name: Lonnie Brown MRN: [id number] Date of Birth: 06-22-1960 Referring Provider (PT): Dr. Earl Lagos    Encounter Date: 02/05/2019  PT End of Session - 02/05/19 0857    Visit Number  2    Date for PT Re-Evaluation  03/25/19    Authorization Type  no insurance    PT Start Time  [phone]   Pt late   PT Stop Time  0936    PT Time Calculation (min)  43 min    Activity Tolerance  Patient tolerated treatment well    Behavior During Therapy  [facility] for tasks assessed/performed       Past Medical History:  Diagnosis Date  . Diabetes (HCC)   . Hypercholesterolemia   . Hypertension     Past Surgical History:  Procedure Laterality Date  . EYE SURGERY    . TONSILLECTOMY      There were no vitals filed for this visit.  Subjective Assessment - 02/05/19 0856    Subjective  Just a little pain in the lt knee today.  Otherwise I'm feeling good.    Limitations  Walking    How long can you walk comfortably?  house to car    Diagnostic tests  while hospitalized    Patient Stated Goals  get a little stronger mainly in my knee    Currently in Pain?  Yes    Pain Score  4     Pain Location  Knee    Pain Orientation  Left    Pain Descriptors / Indicators  Aching    Pain Onset  1 to 4 weeks ago    Pain Frequency  Intermittent         [facility] PT Assessment - 02/05/19 0001      Dynamic Gait Index   Level Surface  Normal    Change in Gait Speed  Normal    Gait with Horizontal Head Turns  Normal    Gait with Vertical Head Turns  Normal    Gait and Pivot Turn  Normal    Step Over Obstacle  Normal    Step Around Obstacles  Normal    Steps  Normal    Total Score  24    DGI comment:  done with single point cane without difficulty                   [facility] Adult PT  Treatment/Exercise - 02/05/19 0001      Exercises   Exercises  Knee/Hip;Hand;Ankle;Shoulder      Knee/Hip Exercises: Aerobic   Nustep  seat 13 arms 11, L5 x 6' PT cued use Lt side greater than Rt, PT present to discuss start of HEP      Knee/Hip Exercises: Seated   Sit to Sand  10 reps;without UE support   arms crossed, seated on black pad in chair     Shoulder Exercises: Standing   Flexion  Strengthening;Both;Theraband;20 reps    Theraband Level (Shoulder Flexion)  Level 2 (Red)    Flexion Limitations  standing on red band with one foot    ABduction  Strengthening;Both;Theraband;12 reps    Theraband Level (Shoulder ABduction)  Level 2 (Red)    ABduction Limitations  standing with one foot on red band    Row  Strengthening;Both;20 reps;Theraband    Theraband  Level (Shoulder Row)  Level 4 (Blue)    Row Limitations  standing on band in lunge, bent over row Lt foot forward standing on band      Hand Exercises   Other Hand Exercises  towel squeeze 10x3" seated      Ankle Exercises: Seated   Towel Inversion/Eversion Limitations  10x3 sec hold ball squeeze and blue band bil eversion seated with blue band under feet, crossed and held in bil UEs    Heel Raises  Both    Heel Raises Limitations  15, bil UE support             PT Education - 02/05/19 0931    Education Details  Access Code: [facility]    Person(s) Educated  Patient    Methods  Explanation;Demonstration;Verbal cues;Handout    Comprehension  Verbalized understanding;Returned demonstration       PT Short Term Goals - 02/05/19 0939      PT SHORT TERM GOAL #1   Title  The patient Lonnie demonstrate understanding of initial HEP for left sided strengthening and balance    Baseline  initiated HEP for Lt sided strength    Status  On-going        PT Long Term Goals - 01/28/19 2020      PT LONG TERM GOAL #1   Title  The patient Lonnie be independent in safe self progression of HEP    Time  8    Period  Weeks     Status  New    Target Date  03/25/19      PT LONG TERM GOAL #2   Title  The patient Lonnie have improved left UE strength to grossly 5-/5 and grip to 52# of force on left    Time  8    Period  Weeks    Status  New      PT LONG TERM GOAL #3   Title  The patient Lonnie be able to lift/carry medium objects as needed for return to work    Time  8    Period  Weeks    Status  New      PT LONG TERM GOAL #4   Title  The patient Lonnie be able to walk 500 feet without assistive device and  without loss of balance    Time  8    Period  Weeks    Status  New      PT LONG TERM GOAL #5   Title  LE strength including ankle eversion/inversion grossly 4+/5 needed for return to work    Time  8    Period  Weeks    Status  New      Additional Long Term Goals   Additional Long Term Goals  Yes      PT LONG TERM GOAL #6   Title  BERG balance score improved to 54/56 needed for lower risk of falls    Time  8    Period  Weeks    Status  New            Plan - 02/05/19 0910    Clinical Impression Statement  Pt arrived late and needed to take a phone call during session.  He was able to perform DGI on all levels without any difficulty or LOB but with use of SPC which he hopes to d/c over time.  PT used time available within session to do NuStep for Lt sided strength at L5 with  cue to use Lt UE/LE > Rt side and introduced initial HEP targeting multiple muscle groups having Pt perform one set of each to ensure Pt understood and performed properly with appropriate level of challenge.  He needed occassional breaks to catch his breath but reported he very much has missed working out and enjoyed the ther ex today.  He Lonnie continue to benefit from skilled PT to address strength and functional task deficits to be able to return to work following CVA.    Comorbidities  HTN; diabetes, chronic kidney disease; obesity    PT Next Visit Plan  NuStep L5 with emphasis on Lt side, review HEP, progress HEP to include a  balance task and knee specific strength, endurance (add UBE?)    PT Home Exercise Plan  Access Code: [email or web address]    Consulted and Agree with Plan of Care  Patient       Patient Lonnie benefit from skilled therapeutic intervention in order to improve the following deficits and impairments:     Visit Diagnosis: Muscle weakness (generalized)  Difficulty in walking, not elsewhere classified     Problem List Patient Active Problem List   Diagnosis Date Noted  . CVA (cerebral vascular accident) (HCC) 01/20/2019  . Hyperlipidemia associated with type 2 diabetes mellitus (HCC) 12/02/2018  . Chronic midline low back pain without sciatica 09/02/2018  . Controlled type 2 diabetes mellitus without complication, without long-term current use of insulin (HCC) 03/01/2018  . Erectile dysfunction 03/01/2018  . Noncompliance with medications 03/01/2018  . CKD (chronic kidney disease) stage 3, GFR 30-59 ml/min 08/27/2017  . Hyperlipidemia 09/25/2016  . Morbid obesity (HCC) 07/20/2015  . Achilles tendinitis 04/09/2015  . Hypertension 03/04/2015   Morton Peters, PT 02/05/19 9:41 AM   [facility] [address], [address] [place], [place], [address] Phone: [phone]   Fax:  [phone]  Name: Lonnie Brown MRN: [id number] Date of Birth: 1960/01/14

## 2019-02-05 NOTE — Patient Instructions (Signed)
Access Code: [facility]  URL: [email or web address]  Date: 02/05/2019  Prepared by: Loistine Simas Rica Heather   Exercises  Heel rises with counter support - 20 reps - 2 sets - 2x daily - 7x weekly  Seated Ankle Eversion with Resistance - 15 reps - 2 sets - 2 hold - 2x daily - 7x weekly  Isometric Ankle Inversion - 15 reps - 2 sets - 2 hold - 2x daily - 7x weekly  Seated Gripping Towel - 10 reps - 2 sets - 3 hold - 2x daily - 7x weekly  Standing Shoulder Abduction with Resistance - 15 reps - 2 sets - 2x daily - 7x weekly  Standing Shoulder Flexion with Resistance - 20 reps - 2 sets - 2x daily - 7x weekly  Standing Bent Over Shoulder Row with Resistance - 20 reps - 2 sets - 2x daily - 7x weekly  Sit to Stand with Arms Crossed - 10 reps - 2 sets - 2x daily - 7x weekly

## 2019-02-06 ENCOUNTER — Other Ambulatory Visit: Payer: Self-pay

## 2019-02-06 ENCOUNTER — Other Ambulatory Visit: Payer: Self-pay | Admitting: Internal Medicine

## 2019-02-06 ENCOUNTER — Ambulatory Visit: Payer: Self-pay | Attending: Internal Medicine | Admitting: Internal Medicine

## 2019-02-06 DIAGNOSIS — I663 Occlusion and stenosis of cerebellar arteries: Secondary | ICD-10-CM | POA: Insufficient documentation

## 2019-02-06 DIAGNOSIS — E1169 Type 2 diabetes mellitus with other specified complication: Secondary | ICD-10-CM

## 2019-02-06 DIAGNOSIS — I10 Essential (primary) hypertension: Secondary | ICD-10-CM

## 2019-02-06 DIAGNOSIS — Z09 Encounter for follow-up examination after completed treatment for conditions other than malignant neoplasm: Secondary | ICD-10-CM

## 2019-02-06 DIAGNOSIS — G459 Transient cerebral ischemic attack, unspecified: Secondary | ICD-10-CM

## 2019-02-06 DIAGNOSIS — E049 Nontoxic goiter, unspecified: Secondary | ICD-10-CM

## 2019-02-06 DIAGNOSIS — E785 Hyperlipidemia, unspecified: Secondary | ICD-10-CM

## 2019-02-06 MED ORDER — HYDROCHLOROTHIAZIDE 12.5 MG PO TABS: 12.5000 mg | ORAL_TABLET | Freq: Every day | ORAL | 3 refills | Status: DC

## 2019-02-06 MED FILL — HYDROCHLOROTHIAZIDE 12.5 MG: 12.5 | 30 days supply | Qty: 30 | Fill #0

## 2019-02-06 NOTE — Progress Notes (Signed)
Virtual Visit via Telephone Note Due to current restrictions/limitations of in-office visits due to the COVID-19 pandemic, this scheduled clinical appointment was converted to a telehealth visit  I connected with Lonnie Brown on 02/06/19 at 11:45 a.m by telephone and verified that I am speaking with the correct person using two identifiers. I am in my office.  The patient is at home.  Only the patient and myself participated in this encounter.  I discussed the limitations, risks, security and privacy concerns of performing an evaluation and management service by telephone and the availability of in person appointments. I also discussed with the patient that there may be a patient responsible charge related to this service. The patient expressed understanding and agreed to proceed.   History of Present Illness: History of HTN, CKDstage 3, obesity, HL, DM, ED. purpose of today's visit is transition of care. Patient hospitalized 1/11-13/2021. Date of phone call by caseworker: 01/23/2019  Patient was admitted with numbness in the upper and lower extremity left side.  CAT scan of the head was negative.  CTA of the head revealed moderate right posterior cerebral artery stenosis and mild stenosis left posterior cerebral artery.  Incidental finding was enlarged left lobe of the thyroid with ill-defined low density area.  Thyroid ultrasound to rule out nodule recommended.  Carotid ultrasounds were negative.  Echo negative.  Patient was discharged on Plavix for 3 weeks, aspirin, Lipitor increased to 80 mg daily, Norvasc continued, HCTZ discontinued.  TIA: doing better.   -some residual numbness in LT jaw, foot and hand.  Has a soapy taste in mouth since hosp dischg.  No worse.  No weakness in hands.   Reports compliance with Norvasc, Plavix, ASA and Lipitor.  No muscle cramps/aches from increase dose of Lipitor.   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  He has a form  that he will drop off this afternoon that ask about when he can return to work.  Patient currently seeing physical therapist once a week.  He has had 2 sessions so far.  He has not been released as yet. Does not have insurance or [employer] discount  HTN:  No device to check BP.  HCTZ was discontinued by hosp.  Continue on Norvasc.   Limits salt in foods.  No CP/SOB  DM:  Doing better with eating habits.  Eating more salads and fruits.  Reports compliance with Metformin  Outpatient Encounter Medications as of 02/06/2019  Medication Sig  . amLODipine (NORVASC) 10 MG tablet Take 1 tablet (10 mg total) by mouth daily.  Marland Kitchen aspirin EC 81 MG EC tablet Take 1 tablet (81 mg total) by mouth daily.  Marland Kitchen atorvastatin (LIPITOR) 80 MG tablet Take 1 tablet (80 mg total) by mouth daily at 6 PM.  . clopidogrel (PLAVIX) 75 MG tablet Take 1 tablet (75 mg total) by mouth daily.  . metFORMIN (GLUCOPHAGE) 500 MG tablet Take 2 tablets (1,000 mg total) by mouth 2 (two) times daily with a meal.  . sildenafil (VIAGRA) 50 MG tablet 1 tab half hour to 1 hour before sexual intercourse.  Limit to 1 tablet per 24-hour. (Patient not taking: Reported on 01/20/2019)   No facility-administered encounter medications on file as of 02/06/2019.    Observations/Objective:   Chemistry      Component Value Date/Time   NA 138 01/22/2019 0304   NA 141 09/02/2018 1645   K 4.3 01/22/2019 0304   CL 103 01/22/2019 0304  CO2 27 01/22/2019 0304   BUN 15 01/22/2019 0304   BUN 14 09/02/2018 1645   CREATININE 1.48 (H) 01/22/2019 0304      Component Value Date/Time   CALCIUM 9.0 01/22/2019 0304   ALKPHOS 76 01/20/2019 0736   AST 23 01/20/2019 0736   ALT 36 01/20/2019 0736   BILITOT 0.8 01/20/2019 0736   BILITOT 0.3 09/02/2018 1645     Lab Results  Component Value Date   CHOL 129 01/20/2019   HDL 30 (L) 01/20/2019   LDLCALC 85 01/20/2019   TRIG 68 01/20/2019   CHOLHDL 4.3 01/20/2019   Lab Results  Component Value Date   WBC  6.7 01/20/2019   HGB 14.3 01/20/2019   HCT 42.0 01/20/2019   MCV 90.1 01/20/2019   PLT 251 01/20/2019   Lab Results  Component Value Date   HGBA1C 7.0 (H) 01/20/2019   Assessment and Plan: 1. Hospital discharge follow-up 2. TIA (transient ischemic attack) Patient will complete 3 weeks course of Plavix and continue on aspirin.  He is tolerating the high-dose Lipitor. Discussed the importance of good blood pressure, diabetes and cholesterol control. He will drop off the form for me today.  However I have told him that I will release him to return to work once he is done with physical therapy  3. Cerebellar artery occlusion or stenosis See #2 above  4. Essential hypertension Continue Norvasc.  When he comes today to drop off forms I will have my CMA check his blood pressure.  5. Hyperlipidemia associated with type 2 diabetes mellitus ([place]) Continue Lipitor  6. Type 2 diabetes mellitus with other specified complication, without long-term current use of insulin (HCC) Continue Metformin.  Dietary counseling given.  7. Thyroid enlarged - US Soft Tissue Head/Neck; Future   Follow Up Instructions: 3 mths   I discussed the assessment and treatment plan with the patient. The patient was provided an opportunity to ask questions and all were answered. The patient agreed with the plan and demonstrated an understanding of the instructions.   The patient was advised to call back or seek an in-person evaluation if the symptoms worsen or if the condition fails to improve as anticipated.  I provided 18 minutes of non-face-to-face time during this encounter.   Karle Plumber, MD

## 2019-02-06 NOTE — Progress Notes (Signed)
Pt states their a soapy taste in his mouth  Pt states this has been going on since being discharged from the hospital

## 2019-02-07 ENCOUNTER — Ambulatory Visit: Payer: Self-pay | Attending: Internal Medicine

## 2019-02-11 ENCOUNTER — Other Ambulatory Visit: Payer: Self-pay

## 2019-02-11 ENCOUNTER — Ambulatory Visit: Payer: Self-pay | Attending: Internal Medicine | Admitting: Physical Therapy

## 2019-02-11 ENCOUNTER — Encounter: Payer: Self-pay | Admitting: Physical Therapy

## 2019-02-11 DIAGNOSIS — M6281 Muscle weakness (generalized): Secondary | ICD-10-CM | POA: Insufficient documentation

## 2019-02-11 DIAGNOSIS — R262 Difficulty in walking, not elsewhere classified: Secondary | ICD-10-CM | POA: Insufficient documentation

## 2019-02-11 DIAGNOSIS — I69852 Hemiplegia and hemiparesis following other cerebrovascular disease affecting left dominant side: Secondary | ICD-10-CM | POA: Insufficient documentation

## 2019-02-11 NOTE — Therapy (Signed)
[facility] [address], [address] [place], [place], [address] Phone: [phone]   Fax:  [phone]  Physical Therapy Treatment  Patient Details  Name: Lonnie Brown MRN: [id number] Date of Birth: 1960/08/19 Referring Provider (PT): Dr. Earl Lagos    Encounter Date: 02/11/2019  PT End of Session - 02/11/19 1403    Visit Number  3    Date for PT Re-Evaluation  03/25/19    Authorization Type  no insurance    PT Start Time  1032   pt late   PT Stop Time  1100    PT Time Calculation (min)  28 min    Activity Tolerance  Patient tolerated treatment well       Past Medical History:  Diagnosis Date  . Diabetes (HCC)   . Hypercholesterolemia   . Hypertension     Past Surgical History:  Procedure Laterality Date  . EYE SURGERY    . TONSILLECTOMY      There were no vitals filed for this visit.  Subjective Assessment - 02/11/19 1033    Subjective  Slight pain in left knee.  It comes and goes.  Uses cane around the house only.  Denies loss of balance.  I feel like my left arm is still numb and things slip from my hand.  Left leg might have a little weakness b/c of the pain.  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  Yes    Pain Score  3     Pain Location  Knee    Pain Orientation  Left    Pain Type  Chronic pain                       [facility] Adult PT Treatment/Exercise - 02/11/19 0001      Knee/Hip Exercises: Stretches   Active Hamstring Stretch  Right;Left;3 reps    Active Hamstring Stretch Limitations  2nd step     Hip Flexor Stretch  Right;Left;5 reps    Hip Flexor Stretch Limitations  on 2nd step       Knee/Hip Exercises: Aerobic   Nustep  seat 13 5 min       Knee/Hip Exercises: Standing   Heel Raises  Both;15 reps    Heel Raises Limitations  and toe raises 15x     Hip Extension  Stengthening;Right;Left    Extension Limitations  leaning  on table     Forward Step Up  Right;Left;1 set;5 sets    Wall Squat Limitations  high table 10# sit to stand small range 10x     Other Standing Knee Exercises  wall climbs 15x     Other Standing Knee Exercises  dead lift 10# 10x       Knee/Hip Exercises: Seated   Sit to Sand  5 reps   painful on left knee so discontinued     Shoulder Exercises: Seated   Other Seated Exercises  black band seated dead lifts 15x    Other Seated Exercises  10# seated Vs for core strengthening      Shoulder Exercises: Standing   Other Standing Exercises  wall push ups 15x    Other Standing Exercises  table top push ups 15x                PT Short Term Goals - 02/05/19 0939      PT SHORT TERM  GOAL #1   Title  The patient will demonstrate understanding of initial HEP for left sided strengthening and balance    Baseline  initiated HEP for Lt sided strength    Status  On-going        PT Long Term Goals - 01/28/19 2020      PT LONG TERM GOAL #1   Title  The patient will be independent in safe self progression of HEP    Time  8    Period  Weeks    Status  New    Target Date  03/25/19      PT LONG TERM GOAL #2   Title  The patient will have improved left UE strength to grossly 5-/5 and grip to 52# of force on left    Time  8    Period  Weeks    Status  New      PT LONG TERM GOAL #3   Title  The patient will be able to lift/carry medium objects as needed for return to work    Time  8    Period  Weeks    Status  New      PT LONG TERM GOAL #4   Title  The patient will be able to walk 500 feet without assistive device and  without loss of balance    Time  8    Period  Weeks    Status  New      PT LONG TERM GOAL #5   Title  LE strength including ankle eversion/inversion grossly 4+/5 needed for return to work    Time  8    Period  Weeks    Status  New      Additional Long Term Goals   Additional Long Term Goals  Yes      PT LONG TERM GOAL #6   Title  BERG balance score  improved to 54/56 needed for lower risk of falls    Time  8    Period  Weeks    Status  New            Plan - 02/11/19 1404    Clinical Impression Statement  The patient reports improving UE and LE mobility although he states 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  He is able to perform a progression of strengthening ex's with increased intensity and repetition.  Modifications needed secondary to left anterior knee pain with sit to stand.  Therapist monitoring response with all.    Comorbidities  HTN; diabetes, chronic kidney disease; obesity    Rehab Potential  Good    PT Frequency  1x / week    PT Duration  8 weeks    PT Treatment/Interventions  ADLs/Self Care Home Management;Gait training;Functional mobility training;Therapeutic activities;Therapeutic exercise;Balance training;Neuromuscular re-education;Manual techniques;Patient/family education    PT Next Visit Plan  strengthening, balance challenges to prepare for return to work    PT Home Exercise Plan  Access Code: [facility]       Patient will benefit from skilled therapeutic intervention in order to improve the following deficits and impairments:  Difficulty walking, Pain, Impaired UE functional use, Decreased activity tolerance, Impaired perceived functional ability, Decreased balance, Decreased strength  Visit Diagnosis: Muscle weakness (generalized)  Difficulty in walking, not elsewhere classified  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  TIA (transient ischemic attack) 02/06/2019  . Cerebellar artery occlusion or stenosis 02/06/2019  . Thyroid enlarged 02/06/2019  . CVA (cerebral vascular accident) ([place]) 01/20/2019  . Hyperlipidemia associated with type 2 diabetes mellitus ([place]) 12/02/2018  . Chronic midline low back pain without sciatica 09/02/2018  .  Controlled type 2 diabetes mellitus without complication, without long-term current use of insulin ([place]) 03/01/2018  . Erectile dysfunction 03/01/2018  . Noncompliance with medications 03/01/2018  . CKD (chronic kidney disease) stage 3, GFR 30-59 ml/min 08/27/2017  . Hyperlipidemia 09/25/2016  . Morbid obesity ([place]) 07/20/2015  . Achilles tendinitis 04/09/2015  . Hypertension 03/04/2015   Ruben Im, PT 02/11/19 6:00 PM Phone: [phone] Fax: [phone] Alvera Singh 02/11/2019, 5:59 PM  [facility] [address] [place], [place], [address] Phone: [phone]   Fax:  [phone]  Name: Lonnie Brown MRN: [id number] Date of Birth: [birth year]

## 2019-02-12 ENCOUNTER — Telehealth: Payer: Self-pay | Admitting: Internal Medicine

## 2019-02-12 NOTE — Telephone Encounter (Signed)
Phone call placed today to Arvid Right the leave management specialist for patient's employer.  I had received FMLA form to be completed for the patient.  The FMLA form was due today but I will not have access to a fax machine until tomorrow so I spoke with Ms. Vear Clock to make sure this is okay.  She said that would be fine to send it tomorrow.  In terms of the physicians released to return to work form, I told her I will not complete this until patient has been released from physical therapy.  She expressed understanding.

## 2019-02-13 ENCOUNTER — Telehealth: Payer: Self-pay

## 2019-02-13 NOTE — Telephone Encounter (Signed)
Contacted pt and made aware that FMLA paperwork is ready for pickup. Pt doesn't have any questions or concerns

## 2019-02-18 ENCOUNTER — Other Ambulatory Visit: Payer: Self-pay

## 2019-02-18 ENCOUNTER — Other Ambulatory Visit: Payer: Self-pay | Admitting: Internal Medicine

## 2019-02-18 ENCOUNTER — Ambulatory Visit: Payer: Self-pay | Admitting: Physical Therapy

## 2019-02-18 ENCOUNTER — Ambulatory Visit (HOSPITAL_COMMUNITY)
Admission: RE | Admit: 2019-02-18 | Discharge: 2019-02-18 | Disposition: A | Discharge status: Home / Self Care | Payer: Self-pay | Source: Ambulatory Visit | Attending: Internal Medicine | Admitting: Internal Medicine

## 2019-02-18 ENCOUNTER — Encounter: Payer: Self-pay | Admitting: Physical Therapy

## 2019-02-18 DIAGNOSIS — I69852 Hemiplegia and hemiparesis following other cerebrovascular disease affecting left dominant side: Secondary | ICD-10-CM

## 2019-02-18 DIAGNOSIS — R262 Difficulty in walking, not elsewhere classified: Secondary | ICD-10-CM

## 2019-02-18 DIAGNOSIS — E049 Nontoxic goiter, unspecified: Secondary | ICD-10-CM

## 2019-02-18 DIAGNOSIS — M6281 Muscle weakness (generalized): Secondary | ICD-10-CM

## 2019-02-18 NOTE — Therapy (Signed)
[facility] [address], [address] [place], [place], [address] Phone: [phone]   Fax:  [phone]  Physical Therapy Treatment  Patient Details  Name: Lonnie Brown MRN: [id number] Date of Birth: [birth year] Referring Provider (PT): Dr. Earl Lagos    Encounter Date: 02/18/2019  PT End of Session - 02/18/19 1442    Visit Number  4    Date for PT Re-Evaluation  03/25/19    Authorization Type  no insurance    PT Start Time  1015    PT Stop Time  1055    PT Time Calculation (min)  40 min    Activity Tolerance  Patient tolerated treatment well       Past Medical History:  Diagnosis Date  . Diabetes (HCC)   . Hypercholesterolemia   . Hypertension     Past Surgical History:  Procedure Laterality Date  . EYE SURGERY    . TONSILLECTOMY      There were no vitals filed for this visit.  Subjective Assessment - 02/18/19 1022    Subjective  Not much change in UE numbness.  Mild left knee pain.  My walking time has gotten better.    How long can you walk comfortably?  I think I could walk 50 yards    Currently in Pain?  Yes    Pain Score  2     Pain Location  Knee    Pain Orientation  Left                       [facility] Adult PT Treatment/Exercise - 02/18/19 0001      Therapeutic Activites    Work Counselling psychologist  carrying objects 10# and 25# stepping over obstacles and stooping       Knee/Hip Exercises: Aerobic   Nustep  seat 13 10 min L3 while discussing status     Other Aerobic  seated UBE L1 1 minute each direction as fast as possible 3 min total       Knee/Hip Exercises: Standing   Heel Raises  Both;15 reps    Heel Raises Limitations  and toe raises 15x     Hip Extension  Stengthening;Right;Left    Extension Limitations  leaning on table     Forward Step Up Limitations  2 sets of 30 sec     Other Standing Knee Exercises  hip hinge 10# to knee level     Other Standing Knee Exercises  dead  lift 10# 10x 25# 10x       Knee/Hip Exercises: Seated   Sit to Sand  10 reps   holding 10# weight; then 25# weight      Shoulder Exercises: Standing   Other Standing Exercises  wall push ups 30 sec                PT Short Term Goals - 02/18/19 1448      PT SHORT TERM GOAL #1   Title  The patient will demonstrate understanding of initial HEP for left sided strengthening and balance    Status  Achieved      PT SHORT TERM GOAL #2   Title  BERG balance score improved to 52/56    Time  4    Period  Weeks    Status  On-going        PT Long Term Goals - 01/28/19 2020      PT LONG TERM GOAL #1   Title  The patient will be independent in safe self progression of HEP    Time  8    Period  Weeks    Status  New    Target Date  03/25/19      PT LONG TERM GOAL #2   Title  The patient will have improved left UE strength to grossly 5-/5 and grip to 52# of force on left    Time  8    Period  Weeks    Status  New      PT LONG TERM GOAL #3   Title  The patient will be able to lift/carry medium objects as needed for return to work    Time  8    Period  Weeks    Status  New      PT LONG TERM GOAL #4   Title  The patient will be able to walk 500 feet without assistive device and  without loss of balance    Time  8    Period  Weeks    Status  New      PT LONG TERM GOAL #5   Title  LE strength including ankle eversion/inversion grossly 4+/5 needed for return to work    Time  8    Period  Weeks    Status  New      Additional Long Term Goals   Additional Long Term Goals  Yes      PT LONG TERM GOAL #6   Title  BERG balance score improved to 54/56 needed for lower risk of falls    Time  8    Period  Weeks    Status  New            Plan - 02/18/19 1442    Clinical Impression Statement  The patient presents with his cane but does not use it at all in the gym.  He is able to perform increased intensity of exercise today including 25# carries and stooping to  simulate work tasks.  He has no loss of balance during the treatment session.  He is able to perform short bouts of aerobic type exercise and fatigues fairly quickly.  It is unclear if his deconditioning is a long term problem or newer onset with this CVA/TIA event.  He reports he has not tried to walk more than 1/2 a football field.  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   Therapist monitoring response throughout treatment session.    Comorbidities  HTN; diabetes, chronic kidney disease; obesity    Rehab Potential  Good    PT Frequency  1x / week    PT Duration  8 weeks    PT Treatment/Interventions  ADLs/Self Care Home Management;Gait training;Functional mobility training;Therapeutic activities;Therapeutic exercise;Balance training;Neuromuscular re-education;Manual techniques;Patient/family education    PT Next Visit Plan  strengthening, balance challenges to prepare for return to work;  6 min walk test;  grip test;  recheck BERG    PT Home Exercise Plan  Access Code: [facility]       Patient will benefit from skilled therapeutic intervention in order to improve the following deficits and impairments:  Difficulty walking, Pain, Impaired UE functional use, Decreased activity tolerance, Impaired perceived functional ability, Decreased balance, Decreased strength  Visit Diagnosis: Muscle weakness (generalized)  Difficulty in walking, not elsewhere classified  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 TIA (transient ischemic attack) 02/06/2019  . Cerebellar artery occlusion or stenosis 02/06/2019  . Thyroid enlarged 02/06/2019  . CVA (cerebral vascular accident) (HCC) 01/20/2019  . Hyperlipidemia associated with type 2 diabetes mellitus (HCC) 12/02/2018  . Chronic midline low back pain  without sciatica 09/02/2018  . Controlled type 2 diabetes mellitus without complication, without long-term current use of insulin (HCC) 03/01/2018  . Erectile dysfunction 03/01/2018  . Noncompliance with medications 03/01/2018  . CKD (chronic kidney disease) stage 3, GFR 30-59 ml/min 08/27/2017  . Hyperlipidemia 09/25/2016  . Morbid obesity (HCC) 07/20/2015  . Achilles tendinitis 04/09/2015  . Hypertension 03/04/2015   Lavinia Sharps, PT 02/18/19 2:50 PM Phone: [phone] Fax: [phone] Vivien Presto 02/18/2019, 2:49 PM  [facility] [address], [address] [place], [place], [address] Phone: [phone]   Fax:  [phone]  Name: Lonnie Brown MRN: [id number] Date of Birth: 12/25/1960

## 2019-02-19 ENCOUNTER — Telehealth: Payer: Self-pay | Admitting: Internal Medicine

## 2019-02-19 DIAGNOSIS — E049 Nontoxic goiter, unspecified: Secondary | ICD-10-CM

## 2019-02-19 NOTE — Telephone Encounter (Signed)
PC placed to pt today to discuss thyroid US results.  Informed pt that the  thyroid ultrasound revealed a nodule on the right and left thyroid gland.  The one on the left is 3.8 cm which is considered a fairly large size.  The radiologist recommends biopsy of this nodule to make sure that it is not cancerous.  Pt is agreeable to having the FNA done.  I will set up with interventional radiologist.  Before doing so, I have asked pt to come to lab to have lab draw for thyroid panel.  He said he will come to lab tomorrow. He expressed understanding of plan and all questions were answered.

## 2019-02-25 ENCOUNTER — Ambulatory Visit: Payer: Self-pay | Admitting: Physical Therapy

## 2019-02-25 ENCOUNTER — Other Ambulatory Visit: Payer: Self-pay

## 2019-02-25 DIAGNOSIS — I69852 Hemiplegia and hemiparesis following other cerebrovascular disease affecting left dominant side: Secondary | ICD-10-CM

## 2019-02-25 DIAGNOSIS — M6281 Muscle weakness (generalized): Secondary | ICD-10-CM

## 2019-02-25 DIAGNOSIS — R262 Difficulty in walking, not elsewhere classified: Secondary | ICD-10-CM

## 2019-02-25 NOTE — Therapy (Signed)
[facility] [address] [place], [place], [address] Phone: [phone]   Fax:  [phone]  Physical Therapy Treatment  Patient Details  Name: Lonnie Brown MRN: [id number] Date of Birth: 1960/02/21 Referring Provider (PT): Dr. Aldine Contes    Encounter Date: 02/25/2019  PT End of Session - 02/25/19 1241    Visit Number  5    Date for PT Re-Evaluation  03/25/19    PT Start Time  2248    PT Stop Time  1057    PT Time Calculation (min)  42 min    Activity Tolerance  Patient tolerated treatment well       Past Medical History:  Diagnosis Date  . Diabetes ([place])   . Hypercholesterolemia   . Hypertension     Past Surgical History:  Procedure Laterality Date  . EYE SURGERY    . TONSILLECTOMY      There were no vitals filed for this visit.  Subjective Assessment - 02/25/19 1023    Subjective  Pretty good week.  Sometimes my knee gives me trouble.  I had trouble getting down onto the toilet b/c of the knee.  Denies previous knee issues.  Presents without the cane.   It's still numb in the side of my face, arm and foot.  I haven't been walking b/c of the rain.    Currently in Pain?  No/denies    Pain Score  0-No pain    Pain Location  Knee    Pain Orientation  Left         [facility] PT Assessment - 02/25/19 0001      Strength   Right Hand Grip (lbs)  60    Left Hand Grip (lbs)  50    Left Hip Flexion  4+/5    Left Knee Extension  4+/5    Left Ankle Plantar Flexion  4/5    Left Ankle Inversion  4-/5    Left Ankle Eversion  4-/5                   [facility] Adult PT Treatment/Exercise - 02/25/19 0001      Therapeutic Activites    Work Economist  stooping, golfers lift; dead lifting; stair climbing      Knee/Hip Exercises: Therapist, art Limitations  2nd step     Hip Flexor Stretch  Right;Left;5 reps    Hip Flexor  Stretch Limitations  on 2nd step       Knee/Hip Exercises: Aerobic   Nustep  seat 13 10 min L4 while discussing status       Knee/Hip Exercises: Standing   Heel Raises  Both;15 reps    Heel Raises Limitations  and toe raises 15x     Forward Step Up Limitations  1 minute     Other Standing Knee Exercises  dead lifts 25# 13x       Knee/Hip Exercises: Seated   Sit to Sand  2 sets;10 reps   holding 25# weight high table      Shoulder Exercises: Standing   Other Standing Exercises  single leg dead lift 10# 5x each leg holding on for balance single hand     Other Standing Exercises  counter top  push ups 20x      Shoulder Exercises: Power Warden/ranger Exercises  55# 15x; 85# 15x seated horizontal row  Other Power Museum/gallery curator  45# lat bar 2x 10                PT Short Term Goals - 02/18/19 1448      PT SHORT TERM GOAL #1   Title  The patient will demonstrate understanding of initial HEP for left sided strengthening and balance    Status  Achieved      PT SHORT TERM GOAL #2   Title  BERG balance score improved to 52/56    Time  4    Period  Weeks    Status  On-going        PT Long Term Goals - 02/25/19 1050      PT LONG TERM GOAL #1   Title  The patient will be independent in safe self progression of HEP    Status  On-going      PT LONG TERM GOAL #2   Title  The patient will have improved left UE strength to grossly 5-/5 and grip to 52# of force on left    Status  On-going      PT LONG TERM GOAL #3   Title  The patient will be able to lift/carry medium objects as needed for return to work    Status  Achieved      PT LONG TERM GOAL #4   Title  The patient will be able to walk 500 feet without assistive device and  without loss of balance    Time  8    Period  Weeks    Status  On-going      PT LONG TERM GOAL #5   Title  LE strength including ankle eversion/inversion grossly 4+/5 needed for return to work    Time  8    Period  Weeks     Status  On-going      PT LONG TERM GOAL #6   Title  BERG balance score improved to 54/56 needed for lower risk of falls    Time  8    Period  Weeks    Status  On-going            Plan - 02/25/19 1241    Clinical Impression Statement  The patient is able to perform a moderate intensity exercise program including some lifting and carrying of 25# weights and resisted pulling.  He complains of knee pain on arrival although it is not a limiting factor with exercise.  He needs 2-3 seated short rest breaks.  No loss of balance during treatment session.  Improved left grip strength from initial assessment.  Therapist monitoring response with all treatment interventions.  Discussed progress and plan for return to work and he agreed that he would like to wait to follow up with PT in 2 weeks.    Comorbidities  HTN; diabetes, chronic kidney disease; obesity    Examination-Activity Limitations  Lift;Locomotion Level;Carry;Stand    Rehab Potential  Good    PT Frequency  1x / week    PT Duration  8 weeks    PT Treatment/Interventions  ADLs/Self Care Home Management;Gait training;Functional mobility training;Therapeutic activities;Therapeutic exercise;Balance training;Neuromuscular re-education;Manual techniques;Patient/family education    PT Next Visit Plan  strengthening, balance challenges to prepare for return to work;  6 min walk test;   recheck BERG    PT Home Exercise Plan  Access Code: [facility]       Patient will benefit from skilled therapeutic intervention in order to improve the following deficits  and impairments:  Difficulty walking, Pain, Impaired UE functional use, Decreased activity tolerance, Impaired perceived functional ability, Decreased balance, Decreased strength  Visit Diagnosis: Muscle weakness (generalized)  Difficulty in walking, not elsewhere classified  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 TIA (transient ischemic attack) 02/06/2019  . Cerebellar artery occlusion or stenosis 02/06/2019  . Thyroid enlarged 02/06/2019  . CVA (cerebral vascular accident) (HCC) 01/20/2019  . Hyperlipidemia associated with type 2 diabetes mellitus (HCC) 12/02/2018  . Chronic midline low back pain without sciatica 09/02/2018  . Controlled type 2 diabetes mellitus without complication, without long-term current use of insulin (HCC) 03/01/2018  . Erectile dysfunction 03/01/2018  . Noncompliance with medications 03/01/2018  . CKD (chronic kidney disease) stage 3, GFR 30-59 ml/min 08/27/2017  . Hyperlipidemia 09/25/2016  . Morbid obesity (HCC) 07/20/2015  . Achilles tendinitis 04/09/2015  . Hypertension 03/04/2015   Lavinia Sharps, PT 02/25/19 12:50 PM Phone: [phone] Fax: [phone] Vivien Presto 02/25/2019, 12:50 PM  [facility] [address], [address] [place], [place], [address] Phone: [phone]   Fax:  [phone]  Name: Lonnie Brown MRN: [id number] Date of Birth: 1960/09/03

## 2019-02-27 ENCOUNTER — Inpatient Hospital Stay: Payer: No Typology Code available for payment source | Admitting: Adult Health

## 2019-03-06 ENCOUNTER — Telehealth: Payer: Self-pay

## 2019-03-06 NOTE — Telephone Encounter (Signed)
Patient is in need of return to work note. Would like to return on Monday March 1st. Please send to fax number on file. Please call patient once the info is sent

## 2019-03-06 NOTE — Telephone Encounter (Signed)
Will forward to pcp

## 2019-03-11 ENCOUNTER — Ambulatory Visit: Payer: Self-pay | Attending: Internal Medicine | Admitting: Physical Therapy

## 2019-03-11 ENCOUNTER — Other Ambulatory Visit: Payer: Self-pay

## 2019-03-11 DIAGNOSIS — R262 Difficulty in walking, not elsewhere classified: Secondary | ICD-10-CM | POA: Insufficient documentation

## 2019-03-11 DIAGNOSIS — M6281 Muscle weakness (generalized): Secondary | ICD-10-CM | POA: Insufficient documentation

## 2019-03-11 DIAGNOSIS — I69852 Hemiplegia and hemiparesis following other cerebrovascular disease affecting left dominant side: Secondary | ICD-10-CM | POA: Insufficient documentation

## 2019-03-11 NOTE — Therapy (Signed)
[facility] [address], [address] [place], [place], [address] Phone: [phone]   Fax:  [phone]  Physical Therapy Treatment  Patient Details  Name: Lonnie Brown MRN: [id number] Date of Birth: 02/23/1960 Referring Provider (PT): Dr. Nischal Narendra    Encounter Date: 03/11/2019  PT End of Session - 03/11/19 1402    Visit Number  6    Date for PT Re-Evaluation  03/25/19    PT Start Time  0930    PT Stop Time  1015    PT Time Calculation (min)  45 min    Activity Tolerance  Patient tolerated treatment well       Past Medical History:  Diagnosis Date  . Diabetes (HCC)   . Hypercholesterolemia   . Hypertension     Past Surgical History:  Procedure Laterality Date  . EYE SURGERY    . TONSILLECTOMY      There were no vitals filed for this visit.  Subjective Assessment - 03/11/19 0940    Subjective  Still numb in hand but not as bad.  I don't use the cane at home.  I may go back to work at the end of the month.  I've been walking in the apartment area.    How long can you walk comfortably?  15 minutes    Currently in Pain?  No/denies    Pain Score  0-No pain         [facility] PT Assessment - 03/11/19 0001      Strength   Overall Strength Comments  50# grip left    Left Hand Grip (lbs)  50    Left Hip Flexion  5/5    Left Knee Extension  5/5    Left Ankle Dorsiflexion  4+/5    Left Ankle Plantar Flexion  5/5    Left Ankle Inversion  4+/5    Left Ankle Eversion  4+/5      6 minute walk test results    Aerobic Endurance Distance Walked  1040      Berg Balance Test   Sit to Stand  Able to stand without using hands and stabilize independently    Standing Unsupported  Able to stand safely 2 minutes    Sitting with Back Unsupported but Feet Supported on Floor or Stool  Able to sit safely and securely 2 minutes    Stand to Sit  Sits safely with minimal use of hands    Transfers  Able to transfer safely,  minor use of hands    Standing Unsupported with Eyes Closed  Able to stand 10 seconds safely    Standing Unsupported with Feet Together  Able to place feet together independently and stand 1 minute safely    From Standing, Reach Forward with Outstretched Arm  Can reach forward >12 cm safely (5")    From Standing Position, Pick up Object from Floor  Able to pick up shoe safely and easily    From Standing Position, Turn to Look Behind Over each Shoulder  Looks behind from both sides and weight shifts well    Turn 360 Degrees  Able to turn 360 degrees safely in 4 seconds or less    Standing Unsupported, Alternately Place Feet on Step/Stool  Able to stand independently and safely and complete 8 steps in 20 seconds    Standing Unsupported, One Foot in Front  Able to plae foot ahead of the other independently and hold 30 seconds      Standing on One Leg  Able to lift leg independently and hold equal to or more than 3 seconds    Total Score  52      Dynamic Gait Index   Level Surface  Normal    Change in Gait Speed  Normal    Gait with Horizontal Head Turns  Normal    Gait with Vertical Head Turns  Normal    Gait and Pivot Turn  Normal    Step Over Obstacle  Normal    Step Around Obstacles  Normal    Steps  Normal    Total Score  24    DGI comment:  no device       Timed Up and Go Test   Normal TUG (seconds)  7                   [facility] Adult PT Treatment/Exercise - 03/11/19 0001      Self-Care   Self-Care  ADL's    ADL's  discussed walking program progression       Neuro Re-ed    Neuro Re-ed Details   moderate dynamic and static balance challenges       Knee/Hip Exercises: Aerobic   Nustep  seat 13 10 min L4 while discussing status       Knee/Hip Exercises: Standing   Heel Raises Limitations  review of HEP and re-issued handout     Forward Lunges Limitations  attempted lunges but painful on knee so discontinued    Hip ADduction  AROM;Right;Left;10 reps    Hip Extension   AROM;Right;Left;10 reps             PT Education - 03/11/19 1402    Education Details  Access Code: [email or web address]      standing counter hip extension, hip abduction    Person(s) Educated  Patient    Methods  Explanation;Demonstration;Handout    Comprehension  Returned demonstration;Verbalized understanding       PT Short Term Goals - 03/11/19 1559      PT SHORT TERM GOAL #1   Title  The patient will demonstrate understanding of initial HEP for left sided strengthening and balance    Status  Achieved      PT SHORT TERM GOAL #2   Title  BERG balance score improved to 52/56    Status  Achieved        PT Long Term Goals - 03/11/19 0958      PT LONG TERM GOAL #1   Title  The patient will be independent in safe self progression of HEP    Status  Achieved      PT LONG TERM GOAL #2   Title  The patient will have improved left UE strength to grossly 5-/5 and grip to 52# of force on left    Status  Partially Met      PT LONG TERM GOAL #3   Title  The patient will be able to lift/carry medium objects as needed for return to work    Status  Achieved      PT LONG TERM GOAL #4   Title  The patient will be able to walk 500 feet without assistive device and  without loss of balance    Status  Achieved      PT LONG TERM GOAL #5   Title  LE strength including ankle eversion/inversion grossly 4+/5 needed for return to work    Status  Achieved        PT LONG TERM GOAL #6   Title  BERG balance score improved to 54/56 needed for lower risk of falls    Status  Partially Met            Plan - 03/11/19 1008    Clinical Impression Statement  The patient requests discharge today from PT.  He expresses readiness for return to work later this month and we discussed continuation of a HEP and initiating a walking program to better prepare him.  His Timed up and Go score has improved from 9.76 sec to 7 sec.  Six minute walk test: 1,040 feet without use of his cane with a "moderate" level of  self perceived exertion.  His BERG balance test is 52/56 which indicates a very low risk of falls.  Dynamic Gait Index is 24/24.  The patient has met the majority of goals and should make further improvements as he continues with his HEP.  Will discharge from PT at this time.    Comorbidities  HTN; diabetes, chronic kidney disease; obesity    Examination-Participation Restrictions  Community Activity;Driving;Other;Personal Finances    Rehab Potential  Good    PT Frequency  1x / week    PT Duration  8 weeks    PT Treatment/Interventions  ADLs/Self Care Home Management;Gait training;Functional mobility training;Therapeutic activities;Therapeutic exercise;Balance training;Neuromuscular re-education;Manual techniques;Patient/family education    PT Home Exercise Plan  Access Code: [facility]       Patient will benefit from skilled therapeutic intervention in order to improve the following deficits and impairments:  Difficulty walking, Pain, Impaired UE functional use, Decreased activity tolerance, Impaired perceived functional ability, Decreased balance, Decreased strength  Visit Diagnosis: Muscle weakness (generalized)  Difficulty in walking, not elsewhere classified  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  Patient goals were partially met. Patient is being discharged due to being pleased with the current functional level.  ?????     Problem List Patient Active Problem List   Diagnosis Date Noted  . TIA (transient ischemic attack) 02/06/2019  . Cerebellar artery occlusion or stenosis 02/06/2019  . Thyroid enlarged 02/06/2019  . CVA (cerebral vascular  accident) ([place]) 01/20/2019  . Hyperlipidemia associated with type 2 diabetes mellitus ([place]) 12/02/2018  . Chronic midline low back pain without sciatica 09/02/2018  . Controlled type 2 diabetes mellitus without complication, without long-term current use of insulin ([place]) 03/01/2018  . Erectile dysfunction 03/01/2018  . Noncompliance with medications 03/01/2018  . CKD (chronic kidney disease) stage 3, GFR 30-59 ml/min 08/27/2017  . Hyperlipidemia 09/25/2016  . Morbid obesity ([place]) 07/20/2015  . Achilles tendinitis 04/09/2015  . Hypertension 03/04/2015   Ruben Im, PT 03/11/19 4:02 PM Phone: [phone] Fax: [phone] Alvera Singh 03/11/2019, 4:00 PM  [facility] [place] [facility] [address] [place], [place], [address] Phone: [phone]   Fax:  [phone]  Name: Lonnie Brown MRN: [id number] Date of Birth: 01-31-1960

## 2019-03-11 NOTE — Patient Instructions (Signed)
Access Code: [facility]  URL: [email or web address]  Date: 03/11/2019  Prepared by: Lavinia Sharps   Exercises Heel rises with counter support - 20 reps - 2 sets - 2x daily - 7x weekly Seated Ankle Eversion with Resistance - 15 reps - 2 sets - 2 hold - 2x daily - 7x weekly Isometric Ankle Inversion - 15 reps - 2 sets - 2 hold - 2x daily - 7x weekly Seated Gripping Towel - 10 reps - 2 sets - 3 hold - 2x daily - 7x weekly Standing Shoulder Abduction with Resistance - 15 reps - 2 sets - 2x daily - 7x weekly Standing Shoulder Flexion with Resistance - 20 reps - 2 sets - 2x daily - 7x weekly Standing Bent Over Shoulder Row with Resistance - 20 reps - 2 sets - 2x daily - 7x weekly Sit to Stand with Arms Crossed - 10 reps - 2 sets - 2x daily - 7x weekly Standing Hip Extension with Unilateral Counter Support - 10 reps - 1 sets - 1x daily - 7x weekly Standing Hip Abduction with Unilateral Counter Support - 10 reps                   - 1 sets - 1x daily - 7x weekly

## 2019-03-12 ENCOUNTER — Telehealth: Payer: Self-pay | Admitting: Internal Medicine

## 2019-03-12 NOTE — Telephone Encounter (Signed)
-----   Message from Vivien Presto, PT sent at 03/07/2019  8:44 AM EST ----- He reported the onset of knee pain with this TIA/CVA and that may limit his ability to kneel, squat and climb.  He was able to partially squat to pick up cones from the floor with minimal difficulty but repetitively may be difficult.  He could probably kneel on his other knee but rising would probably be difficult without something to pull up from.  We have worked on some  6 inch step climbing (less than what a ladder would be) and he was OK with 5-10 reps until his knee started to bother him.  The most we have tried lifting in the clinic was 25# which he handled pretty well.   ----- Message ----- From: Marcine Matar, MD Sent: 03/07/2019   7:55 AM EST To: Vivien Presto, PT  So are there any restrictions he would recommend at this time.  In terms of the standing I can put standing for 8 hours a day with breaks in between.  They asked about maximum lifting that he can do and whether he can kneel, squat,and climb.  Any advice on that? ----- Message ----- From: Vivien Presto, PT Sent: 03/06/2019   6:47 PM EST To: Marcine Matar, MD  He seems to be doing quite well.  On his last visit, he was walking without a cane and able to lift and carry 25#.  I'm not sure about his standing tolerance for a full 8 hour shift secondary to general deconditioning.  He expressed that he could possibly return to some paperwork type tasks initially until he built up his stamina.    ----- Message ----- From: Marcine Matar, MD Sent: 03/06/2019   6:23 PM EST To: Vivien Presto, PT  I am the PCP for this patient.  Patient called today wanting a release to return to work.  I wanted to touch base with you to see if he has been released from P.T and is safe to return to work.

## 2019-03-12 NOTE — Telephone Encounter (Signed)
Contacted pt and made aware that form is ready for pick up pt is schedule for a lab appointment 3/4 at 2pm

## 2019-03-13 ENCOUNTER — Ambulatory Visit: Payer: Self-pay | Attending: Internal Medicine

## 2019-03-13 ENCOUNTER — Other Ambulatory Visit: Payer: Self-pay

## 2019-03-13 DIAGNOSIS — E049 Nontoxic goiter, unspecified: Secondary | ICD-10-CM

## 2019-03-13 DIAGNOSIS — Z125 Encounter for screening for malignant neoplasm of prostate: Secondary | ICD-10-CM

## 2019-03-13 DIAGNOSIS — E119 Type 2 diabetes mellitus without complications: Secondary | ICD-10-CM

## 2019-03-13 NOTE — Telephone Encounter (Signed)
Patient picked up form from front office at lab appointment.

## 2019-03-14 ENCOUNTER — Other Ambulatory Visit: Payer: Self-pay | Admitting: Internal Medicine

## 2019-03-14 DIAGNOSIS — E041 Nontoxic single thyroid nodule: Secondary | ICD-10-CM

## 2019-03-14 LAB — TSH+T4F+T3FREE
Free T4: 1.09 ng/dL (ref 0.82–1.77)
T3, Free: 2.9 pg/mL (ref 2.0–4.4)
TSH: 2.05 u[IU]/mL (ref 0.450–4.500)

## 2019-03-14 LAB — HEMOGLOBIN A1C
Est. average glucose Bld gHb Est-mCnc: 151 mg/dL
Hgb A1c MFr Bld: 6.9 % — ABNORMAL HIGH (ref 4.8–5.6)

## 2019-03-14 LAB — PSA: Prostate Specific Ag, Serum: 1.7 ng/mL (ref 0.0–4.0)

## 2019-03-19 ENCOUNTER — Telehealth: Payer: Self-pay | Admitting: Internal Medicine

## 2019-03-19 NOTE — Telephone Encounter (Signed)
Patient called to inform pcp that he is currently not taking the plavix medication.

## 2019-03-26 ENCOUNTER — Encounter: Payer: Self-pay | Admitting: Adult Health

## 2019-03-26 ENCOUNTER — Ambulatory Visit
Admission: RE | Admit: 2019-03-26 | Discharge: 2019-03-26 | Disposition: A | Discharge status: Home / Self Care | Payer: Self-pay | Source: Ambulatory Visit | Attending: Internal Medicine | Admitting: Internal Medicine

## 2019-03-26 ENCOUNTER — Ambulatory Visit (INDEPENDENT_AMBULATORY_CARE_PROVIDER_SITE_OTHER): Payer: Self-pay | Admitting: Adult Health

## 2019-03-26 ENCOUNTER — Other Ambulatory Visit (HOSPITAL_COMMUNITY)
Admission: RE | Admit: 2019-03-26 | Discharge: 2019-03-26 | Disposition: A | Discharge status: Home / Self Care | Payer: Self-pay | Source: Ambulatory Visit | Attending: Physician Assistant | Admitting: Physician Assistant

## 2019-03-26 ENCOUNTER — Other Ambulatory Visit: Payer: Self-pay

## 2019-03-26 VITALS — BP 172/102 | HR 84 | Temp 98.2°F | Ht 71.0 in | Wt 309.2 lb

## 2019-03-26 DIAGNOSIS — I10 Essential (primary) hypertension: Secondary | ICD-10-CM

## 2019-03-26 DIAGNOSIS — E119 Type 2 diabetes mellitus without complications: Secondary | ICD-10-CM

## 2019-03-26 DIAGNOSIS — E785 Hyperlipidemia, unspecified: Secondary | ICD-10-CM

## 2019-03-26 DIAGNOSIS — E1169 Type 2 diabetes mellitus with other specified complication: Secondary | ICD-10-CM

## 2019-03-26 DIAGNOSIS — I6621 Occlusion and stenosis of right posterior cerebral artery: Secondary | ICD-10-CM

## 2019-03-26 DIAGNOSIS — E041 Nontoxic single thyroid nodule: Secondary | ICD-10-CM | POA: Insufficient documentation

## 2019-03-26 DIAGNOSIS — G459 Transient cerebral ischemic attack, unspecified: Secondary | ICD-10-CM

## 2019-03-26 DIAGNOSIS — R29818 Other symptoms and signs involving the nervous system: Secondary | ICD-10-CM

## 2019-03-26 NOTE — Procedures (Signed)
PROCEDURE SUMMARY:  Using direct ultrasound guidance, 5 passes were made using 25 g needles into the nodule within the left lobe of the thyroid.   Ultrasound was used to confirm needle placements on all occasions.   EBL = trace  Specimens were sent to Pathology for analysis.  See procedure note under Imaging tab in Epic for full procedure details.  Betzabeth Derringer S Trica Usery PA-C 03/26/2019 4:09 PM

## 2019-03-26 NOTE — Progress Notes (Signed)
[facility] [address] [place]. [place] [address] [phone]       HOSPITAL FOLLOW UP NOTE  Mr. Lonnie Brown Date of Birth:  July 25, 1960 Medical Record Number:  [id number]   Reason for Referral:  hospital stroke follow up    CHIEF COMPLAINT:  Chief Complaint  Patient presents with  . Hospitalization Follow-up    TxtRm , alone.  Had TIA, 01/20/2019. Manual Bp 172/102.  pcp Lonnie Brown.  PT done.    HPI: Lonnie Brown being seen today for in office hospital follow-up regarding right brain TIA versus questionable small infarct on 01/20/2019.  History obtained from patient and chart review. Reviewed all radiology images and labs personally.  Mr. Lonnie Brown is a 59 y.o. male with history of HTN, HLD, DB  who presented on 01/20/2019 with progressive L arm tingling which progressed to weakness then resolved except for the tingling.   Evaluated by stroke team and Dr. Roda Brown with possible right brain TIA versus small infarct with questionable right PCA territory infarct possibly due to right PCA stenosis with vessel imaging showing moderate right PCA and mild left PCA stenosis.  Incidental finding of enlarged left thyroid lobe and advised to follow-up outpatient with PCP.  Recommended DAPT for 3 weeks and aspirin alone.  HTN stable and recommended long-term BP goal normotensive range.  LDL 85 and increase atorvastatin from 10 mg to 80 mg daily.  Controlled DM with A1c 7.0.  Other stroke risk factors include morbid obesity but no prior history of stroke.  Other active problems include CKD stage IIIa.  Discharged home in stable condition without therapy needs.  [name] is a 59 year old male who is being seen today for hospital follow-up.  He has been doing well since discharge with mild left hemisensory deficit greatest left face, left hand and leg and gait impairment but has improved and recently discharged from physical therapy. He does report occasional soapy  taste when eating but this has been improving.  He is able to ambulate without assistive device without difficulty -previously using a cane after discharge.  He has since returned to work and doing well without difficulties. Completed 3 weeks DAPT and continues on aspirin alone without bleeding or bruising.  Continues on atorvastatin 80 mg daily without myalgias.  Blood pressure elevated today at 172/92 and similar on recheck.  Asymptomatic at this time and does endorse not taking antihypertensives this morning.  He does not routinely monitor blood pressure at home.  Continues to follow with PCP for HTN, HLD and DM management.  No concerns at this time.    ROS:   14 system review of systems performed and negative with exception of numbness/tingling  PMH:  Past Medical History:  Diagnosis Date  . Diabetes (HCC)   . Hypercholesterolemia   . Hypertension     PSH:  Past Surgical History:  Procedure Laterality Date  . EYE SURGERY    . TONSILLECTOMY      Social History:  Social History   Socioeconomic History  . Marital status: Married    Spouse name: Not on file  . Number of children: Not on file  . Years of education: Not on file  . Highest education level: Not on file  Occupational History  . Not on file  Tobacco Use  . Smoking status: Never Smoker  . Smokeless tobacco: Never Used  Substance and Sexual Activity  . Alcohol use: No  . Drug use: No  . Sexual activity: Not on  file  Other Topics Concern  . Not on file  Social History Narrative  . Not on file   Social Determinants of Health   Financial Resource Strain:   . Difficulty of Paying Living Expenses:   Food Insecurity:   . Worried About Charity fundraiser in the Last Year:   . Arboriculturist in the Last Year:   Transportation Needs:   . Film/video editor (Medical):   Marland Kitchen Lack of Transportation (Non-Medical):   Physical Activity:   . Days of Exercise per Week:   . Minutes of Exercise per Session:     Stress:   . Feeling of Stress :   Social Connections:   . Frequency of Communication with Friends and Family:   . Frequency of Social Gatherings with Friends and Family:   . Attends Religious Services:   . Active Member of Clubs or Organizations:   . Attends Archivist Meetings:   Marland Kitchen Marital Status:   Intimate Partner Violence:   . Fear of Current or Ex-Partner:   . Emotionally Abused:   Marland Kitchen Physically Abused:   . Sexually Abused:     Family History:  Family History  Problem Relation Age of Onset  . Diabetes Mother   . Hypertension Mother   . Alcoholism Father     Medications:   Current Outpatient Medications on File Prior to Visit  Medication Sig Dispense Refill  . amLODipine (NORVASC) 10 MG tablet Take 1 tablet (10 mg total) by mouth daily. 30 tablet 6  . aspirin EC 81 MG EC tablet Take 1 tablet (81 mg total) by mouth daily. 30 tablet 1  . atorvastatin (LIPITOR) 80 MG tablet Take 1 tablet (80 mg total) by mouth daily at 6 PM. 30 tablet 1  . hydrochlorothiazide (HYDRODIURIL) 12.5 MG tablet Take 1 tablet (12.5 mg total) by mouth daily. 90 tablet 3  . metFORMIN (GLUCOPHAGE) 500 MG tablet Take 2 tablets (1,000 mg total) by mouth 2 (two) times daily with a meal. 120 tablet 1  . sildenafil (VIAGRA) 50 MG tablet 1 tab half hour to 1 hour before sexual intercourse.  Limit to 1 tablet per 24-hour. 10 tablet 5   No current facility-administered medications on file prior to visit.    Allergies:   Allergies  Allergen Reactions  . Lisinopril Other (See Comments)    Angioedema  . Banana Nausea And Vomiting     Physical Exam  Vitals:   03/26/19 0816  BP: (!) 172/102  Pulse: 84  Temp: 98.2 F (36.8 C)  SpO2: 98%  Weight: (!) 309 lb 3.2 oz (140.3 kg)  Height: 5\' 11"  (1.803 m)   Body mass index is 43.12 kg/m. No exam data present  Depression screen [facility] 2/9 03/26/2019  Decreased Interest 0  Down, Depressed, Hopeless 0  PHQ - 2 Score 0  Altered sleeping -   Change in appetite -  Trouble concentrating -  Moving slowly or fidgety/restless -  Suicidal thoughts -  PHQ-9 Score -     General: Obese pleasant middle-aged African-American male, seated, in no evident distress Head: head normocephalic and atraumatic.   Neck: supple with no carotid or supraclavicular bruits Cardiovascular: regular rate and rhythm, no murmurs Musculoskeletal: no deformity Skin:  no rash/petichiae Vascular:  Normal pulses all extremities   Neurologic Exam Mental Status: Awake and fully alert.   Normal speech and language.  Oriented to place and time. Recent and remote memory intact. Attention span, concentration and  fund of knowledge appropriate. Mood and affect appropriate.  Cranial Nerves: Fundoscopic exam reveals sharp disc margins. Pupils equal, briskly reactive to light. Extraocular movements full without nystagmus. Visual fields full to confrontation. Hearing intact. Facial sensation intact. Face, tongue, palate moves normally and symmetrically.  Motor: Normal bulk and tone. Normal strength in all tested extremity muscles. Sensory.: intact to touch , pinprick , position and vibratory sensation -subjective tingling left side of face, left hand and leg.  Coordination: Rapid alternating movements normal in all extremities. Finger-to-nose and heel-to-shin performed accurately bilaterally. Gait and Station: Arises from chair without difficulty. Stance is normal. Gait demonstrates normal stride length and balance without use of assistive device Reflexes: 1+ and symmetric. Toes downgoing.     NIHSS  0 Modified Rankin  1    Diagnostic Data (Labs, Imaging, Testing)   CT head Unremarkable  CTA head & neck no significant carotid or VA stenosis. Moderate R PCA and mild L PCA stenosis. Enlargement L thyroid lobe.    Carotid Doppler  B ICA 1-39% stenosis, VAs antegrade   2D Echo EF 60-65%. No source of embolus   LDL 85  HgbA1c 7.0   ASSESSMENT: Lonnie Brown is a 59 y.o. year old male presented with left arm tingling progressing to weakness on 01/20/2019 due to right brain TIA versus questionable right PCA territory infarct possibly due to right PCA stenosis. Vascular risk factors include HTN, HLD and DM.  Residual deficits of left hemisensory impairment but overall greatly improving    PLAN:  1. TIA versus small stroke: Continue aspirin 81 mg daily  and atorvastatin for secondary stroke prevention. Maintain strict control of hypertension with blood pressure goal below 130/90, diabetes with hemoglobin A1c goal below 6.5% and cholesterol with LDL cholesterol (bad cholesterol) goal below 70 mg/dL.  I also advised the patient to eat a healthy diet with plenty of whole grains, cereals, fruits and vegetables, exercise regularly with at least 30 minutes of continuous activity daily and maintain ideal body weight. 2. HTN: Advised to continue current treatment regimen.  Today's BP elevated likely due to not taking antihypertensives this morning.  Advised to ensure he takes once he gets home and to follow-up with PCP if remains elevated.  Currently asymptomatic and advised if he becomes symptomatic or blood pressure increases to proceed to ED for further evaluation 3. HLD: Advised to continue current treatment regimen along with continued follow-up with PCP for future prescribing and monitoring of lipid panel 4. DMII: Advised to continue to monitor glucose levels at home along with continued follow-up with PCP for management and monitoring 5. Left hemisensory impairment, poststroke: Encouraged ongoing HEP as recommended during therapy.  Also provided patient with education regarding numbness post stroke 6. Bilateral PCA stenosis: Discussion regarding importance of managing stroke risk factors, increasing activity, ensuring healthy diet and importance of weight loss.  May consider repeating imaging at follow-up visit for surveillance monitoring.    Follow  up in 4 months or call earlier if needed   I spent 40 minutes of face-to-face and non-face-to-face time with patient.  This included previsit chart review, lab review, study review, order entry, electronic health record documentation, patient education    Ihor Austin, [facility]  [facility] [address] [place], [place] [address]  Phone [phone] Fax [phone] Note: This document was prepared with digital dictation and possible smart phrase technology. Any transcriptional errors that result from this process are unintentional.

## 2019-03-26 NOTE — Patient Instructions (Signed)
Continue aspirin 81 mg daily  and atorvastatin 80 mg daily for secondary stroke prevention  Continue to follow up with PCP regarding cholesterol, blood pressure and diabetes management   Highly recommend you start to monitor blood pressure at home - if remains high, please follow up with PCP  Maintain strict control of hypertension with blood pressure goal below 130/90, diabetes with hemoglobin A1c goal below 6.5% and cholesterol with LDL cholesterol (bad cholesterol) goal below 70 mg/dL. I also advised the patient to eat a healthy diet with plenty of whole grains, cereals, fruits and vegetables, exercise regularly and maintain ideal body weight.  Followup in the future with me in 4 months or call earlier if needed       Thank you for coming to see [place] at [facility]. I hope we have been able to provide you high quality care today.  You may receive a patient satisfaction survey over the next few weeks. We would appreciate your feedback and comments so that we may continue to improve ourselves and the health of our patients.

## 2019-03-27 LAB — CYTOLOGY - NON PAP

## 2019-04-02 ENCOUNTER — Telehealth: Payer: Self-pay | Admitting: Internal Medicine

## 2019-04-02 DIAGNOSIS — E041 Nontoxic single thyroid nodule: Secondary | ICD-10-CM

## 2019-04-02 NOTE — Telephone Encounter (Signed)
Contacted pt and went over Dr. Laural Benes message pt states he is willing to get biopsy redone also pt is schedule for May

## 2019-04-02 NOTE — Telephone Encounter (Signed)
PC placed to pt today. I left VMM informing him that the thyroid bx results were inconclusive and in this situation a repeat biopsy is recommended.  Please give me a call back so we can discuss or let me know if you are willing to have repeat biopsy.

## 2019-04-07 ENCOUNTER — Ambulatory Visit: Payer: Self-pay | Admitting: Internal Medicine

## 2019-04-10 ENCOUNTER — Other Ambulatory Visit: Payer: Self-pay | Admitting: Internal Medicine

## 2019-04-10 MED FILL — ?METFORMIN HCL 500MG TABLET: 500 | 30 days supply | Qty: 120 | Fill #1

## 2019-04-10 MED FILL — HYDROCHLOROTHIAZIDE 12.5 MG: 12.5 | 30 days supply | Qty: 30 | Fill #1

## 2019-04-10 MED FILL — ATORVASTATIN 80 MG TABLET: 80 | 30 days supply | Qty: 30 | Fill #1

## 2019-04-11 NOTE — Progress Notes (Signed)
I agree with the above plan 

## 2019-04-29 ENCOUNTER — Other Ambulatory Visit (HOSPITAL_COMMUNITY)
Admission: RE | Admit: 2019-04-29 | Discharge: 2019-04-29 | Disposition: A | Discharge status: Home / Self Care | Payer: Self-pay | Source: Ambulatory Visit | Attending: Radiology | Admitting: Radiology

## 2019-04-29 ENCOUNTER — Ambulatory Visit
Admission: RE | Admit: 2019-04-29 | Discharge: 2019-04-29 | Disposition: A | Discharge status: Home / Self Care | Payer: Self-pay | Source: Ambulatory Visit | Attending: Internal Medicine | Admitting: Internal Medicine

## 2019-04-29 DIAGNOSIS — E041 Nontoxic single thyroid nodule: Secondary | ICD-10-CM

## 2019-04-30 LAB — CYTOLOGY - NON PAP

## 2019-05-02 ENCOUNTER — Telehealth: Payer: Self-pay

## 2019-05-02 NOTE — Telephone Encounter (Signed)
Contacted pt to go over biospy results pt is aware and doesn't have any questions or concerns

## 2019-05-08 ENCOUNTER — Encounter (HOSPITAL_COMMUNITY): Payer: Self-pay

## 2019-05-23 ENCOUNTER — Ambulatory Visit: Payer: Self-pay | Admitting: Internal Medicine

## 2019-05-24 ENCOUNTER — Emergency Department (HOSPITAL_COMMUNITY)
Admission: EM | Admit: 2019-05-24 | Discharge: 2019-05-25 | Disposition: A | Discharge status: Home / Self Care | Payer: Self-pay | Attending: Emergency Medicine | Admitting: Emergency Medicine

## 2019-05-24 ENCOUNTER — Encounter (HOSPITAL_COMMUNITY): Payer: Self-pay | Admitting: *Deleted

## 2019-05-24 ENCOUNTER — Other Ambulatory Visit: Payer: Self-pay

## 2019-05-24 DIAGNOSIS — E1122 Type 2 diabetes mellitus with diabetic chronic kidney disease: Secondary | ICD-10-CM | POA: Insufficient documentation

## 2019-05-24 DIAGNOSIS — Z79899 Other long term (current) drug therapy: Secondary | ICD-10-CM | POA: Insufficient documentation

## 2019-05-24 DIAGNOSIS — N183 Chronic kidney disease, stage 3 unspecified: Secondary | ICD-10-CM | POA: Insufficient documentation

## 2019-05-24 DIAGNOSIS — K8020 Calculus of gallbladder without cholecystitis without obstruction: Secondary | ICD-10-CM | POA: Insufficient documentation

## 2019-05-24 DIAGNOSIS — K760 Fatty (change of) liver, not elsewhere classified: Secondary | ICD-10-CM | POA: Insufficient documentation

## 2019-05-24 DIAGNOSIS — I129 Hypertensive chronic kidney disease with stage 1 through stage 4 chronic kidney disease, or unspecified chronic kidney disease: Secondary | ICD-10-CM | POA: Insufficient documentation

## 2019-05-24 DIAGNOSIS — R1013 Epigastric pain: Secondary | ICD-10-CM

## 2019-05-24 DIAGNOSIS — Z7984 Long term (current) use of oral hypoglycemic drugs: Secondary | ICD-10-CM | POA: Insufficient documentation

## 2019-05-24 DIAGNOSIS — Z7982 Long term (current) use of aspirin: Secondary | ICD-10-CM | POA: Insufficient documentation

## 2019-05-24 LAB — URINALYSIS, ROUTINE W REFLEX MICROSCOPIC
Bilirubin Urine: NEGATIVE
Glucose, UA: NEGATIVE mg/dL
Hgb urine dipstick: NEGATIVE
Ketones, ur: NEGATIVE mg/dL
Leukocytes,Ua: NEGATIVE
Nitrite: NEGATIVE
Protein, ur: NEGATIVE mg/dL
Specific Gravity, Urine: 1.015 (ref 1.005–1.030)
pH: 5 (ref 5.0–8.0)

## 2019-05-24 LAB — COMPREHENSIVE METABOLIC PANEL
ALT: 36 U/L (ref 0–44)
AST: 24 U/L (ref 15–41)
Albumin: 4.1 g/dL (ref 3.5–5.0)
Alkaline Phosphatase: 77 U/L (ref 38–126)
Anion gap: 9 (ref 5–15)
BUN: 17 mg/dL (ref 6–20)
CO2: 26 mmol/L (ref 22–32)
Calcium: 9.8 mg/dL (ref 8.9–10.3)
Chloride: 102 mmol/L (ref 98–111)
Creatinine, Ser: 1.54 mg/dL — ABNORMAL HIGH (ref 0.61–1.24)
GFR calc Af Amer: 56 mL/min — ABNORMAL LOW (ref >60)
GFR calc non Af Amer: 48 mL/min — ABNORMAL LOW (ref >60)
Glucose, Bld: 110 mg/dL — ABNORMAL HIGH (ref 70–99)
Potassium: 3.9 mmol/L (ref 3.5–5.1)
Sodium: 137 mmol/L (ref 135–145)
Total Bilirubin: 0.8 mg/dL (ref 0.3–1.2)
Total Protein: 8.4 g/dL — ABNORMAL HIGH (ref 6.5–8.1)

## 2019-05-24 LAB — CBC
HCT: 48.1 % (ref 39.0–52.0)
Hemoglobin: 15.6 g/dL (ref 13.0–17.0)
MCH: 28.6 pg (ref 26.0–34.0)
MCHC: 32.4 g/dL (ref 30.0–36.0)
MCV: 88.3 fL (ref 80.0–100.0)
Platelets: 262 10*3/uL (ref 150–400)
RBC: 5.45 MIL/uL (ref 4.22–5.81)
RDW: 13.2 % (ref 11.5–15.5)
WBC: 7 10*3/uL (ref 4.0–10.5)
nRBC: 0 % (ref 0.0–0.2)

## 2019-05-24 LAB — LIPASE, BLOOD: Lipase: 58 U/L — ABNORMAL HIGH (ref 11–51)

## 2019-05-24 MED ORDER — HYDROMORPHONE HCL 1 MG/ML IJ SOLN
1.0000 mg | Freq: Once | INTRAMUSCULAR | Status: AC
  Administered 2019-05-24: 1 mg via INTRAVENOUS
  Filled 2019-05-24: qty 1

## 2019-05-24 MED ORDER — ONDANSETRON HCL 4 MG/2ML IJ SOLN
4.0000 mg | Freq: Once | INTRAMUSCULAR | Status: AC
  Administered 2019-05-24: 4 mg via INTRAVENOUS
  Filled 2019-05-24: qty 2

## 2019-05-24 MED ORDER — SODIUM CHLORIDE 0.9 % IV BOLUS
1000.0000 mL | Freq: Once | INTRAVENOUS | Status: AC
  Administered 2019-05-24: 1000 mL via INTRAVENOUS

## 2019-05-24 MED ORDER — SODIUM CHLORIDE 0.9% FLUSH: 3.0000 mL | Freq: Once | INTRAVENOUS | Status: DC

## 2019-05-24 NOTE — ED Provider Notes (Signed)
[facility] EMERGENCY DEPARTMENT Provider Note   CSN: [id number] Arrival date & time: 05/24/19  2019     History Chief Complaint  Patient presents with  . Abdominal Pain    Lonnie Brown is a 59 y.o. male.  Patient presents to the emergency department with a chief complaint of abdominal pain.  He states pain started after dinner tonight.  He states that it is in his upper abdomen.  States pain is severe.  He denies nausea or vomiting.  He denies having had pain like this before.  Denies any chest pain or shortness of breath.  He has not taken anything to alleviate the pain.  Denies any fevers or chills.  He does not drink alcohol.  The history is provided by the patient. No language interpreter was used.       Past Medical History:  Diagnosis Date  . Diabetes ([place])   . Hypercholesterolemia   . Hypertension     Patient Active Problem List   Diagnosis Date Noted  . TIA (transient ischemic attack) 02/06/2019  . Cerebellar artery occlusion or stenosis 02/06/2019  . Thyroid enlarged 02/06/2019  . CVA (cerebral vascular accident) ([place]) 01/20/2019  . Hyperlipidemia associated with type 2 diabetes mellitus ([place]) 12/02/2018  . Chronic midline low back pain without sciatica 09/02/2018  . Controlled type 2 diabetes mellitus without complication, without long-term current use of insulin ([place]) 03/01/2018  . Erectile dysfunction 03/01/2018  . Noncompliance with medications 03/01/2018  . CKD (chronic kidney disease) stage 3, GFR 30-59 ml/min 08/27/2017  . Hyperlipidemia 09/25/2016  . Morbid obesity ([place]) 07/20/2015  . Achilles tendinitis 04/09/2015  . Hypertension 03/04/2015    Past Surgical History:  Procedure Laterality Date  . EYE SURGERY    . TONSILLECTOMY         Family History  Problem Relation Age of Onset  . Diabetes Mother   . Hypertension Mother   . Alcoholism Father     Social History   Tobacco Use  . Smoking status: Never Smoker  .  Smokeless tobacco: Never Used  Substance Use Topics  . Alcohol use: No  . Drug use: No    Home Medications Prior to Admission medications   Medication Sig Start Date End Date Taking? Authorizing Provider  amLODipine (NORVASC) 10 MG tablet Take 1 tablet (10 mg total) by mouth daily. 05/30/18  Yes Ladell Pier, MD  aspirin EC 81 MG EC tablet Take 1 tablet (81 mg total) by mouth daily. 01/22/19  Yes Jose Persia, MD  atorvastatin (LIPITOR) 80 MG tablet Take 1 tablet (80 mg total) by mouth daily at 6 PM. 01/22/19  Yes Jose Persia, MD  hydrochlorothiazide (HYDRODIURIL) 12.5 MG tablet Take 1 tablet (12.5 mg total) by mouth daily. 02/06/19  Yes Ladell Pier, MD  metFORMIN (GLUCOPHAGE) 500 MG tablet Take 2 tablets (1,000 mg total) by mouth 2 (two) times daily with a meal. 01/22/19  Yes Jose Persia, MD  sildenafil (VIAGRA) 50 MG tablet 1 tab half hour to 1 hour before sexual intercourse.  Limit to 1 tablet per 24-hour. Patient taking differently: Take 50 mg by mouth as needed for erectile dysfunction.  02/28/18  Yes Ladell Pier, MD    Allergies    Lisinopril and Banana  Review of Systems   Review of Systems  All other systems reviewed and are negative.   Physical Exam Updated Vital Signs BP (!) 206/125 (BP Location: Right Arm)   Pulse 86   Temp  97.8 F (36.6 C) (Oral)   Resp 19   Wt (!) 140.3 kg   SpO2 97%   BMI 43.14 kg/m   Physical Exam Vitals and nursing note reviewed.  Constitutional:      Appearance: He is well-developed.  HENT:     Head: Normocephalic and atraumatic.  Eyes:     Conjunctiva/sclera: Conjunctivae normal.  Cardiovascular:     Rate and Rhythm: Normal rate and regular rhythm.     Heart sounds: No murmur.  Pulmonary:     Effort: Pulmonary effort is normal. No respiratory distress.     Breath sounds: Normal breath sounds.  Abdominal:     Palpations: Abdomen is soft.     Tenderness: There is abdominal tenderness.     Comments:  Epigastric and RUQ tenderness  Musculoskeletal:     Cervical back: Neck supple.  Skin:    General: Skin is warm and dry.  Neurological:     Mental Status: He is alert.     ED Results / Procedures / Treatments   Labs (all labs ordered are listed, but only abnormal results are displayed) Labs Reviewed  LIPASE, BLOOD - Abnormal; Notable for the following components:      Result Value   Lipase 58 (*)    All other components within normal limits  COMPREHENSIVE METABOLIC PANEL - Abnormal; Notable for the following components:   Glucose, Bld 110 (*)    Creatinine, Ser 1.54 (*)    Total Protein 8.4 (*)    GFR calc non Af Amer 48 (*)    GFR calc Af Amer 56 (*)    All other components within normal limits  CBC  URINALYSIS, ROUTINE W REFLEX MICROSCOPIC    EKG EKG Interpretation  Date/Time:  Saturday May 24 2019 22:40:10 EDT Ventricular Rate:  86 PR Interval:    QRS Duration: 120 QT Interval:  384 QTC Calculation: 460 R Axis:   -31 Text Interpretation: Sinus rhythm IVCD, consider atypical RBBB Inferior infarct, old No significant change since last tracing Confirmed by Zadie Rhine ([id number]) on 05/24/2019 11:10:52 PM   Radiology No results found.  Procedures Procedures (including critical care time)  Medications Ordered in ED Medications  sodium chloride flush (NS) 0.9 % injection 3 mL (has no administration in time range)  HYDROmorphone (DILAUDID) injection 1 mg (has no administration in time range)  ondansetron (ZOFRAN) injection 4 mg (has no administration in time range)  sodium chloride 0.9 % bolus 1,000 mL (has no administration in time range)    ED Course  I have reviewed the triage vital signs and the nursing notes.  Pertinent labs & imaging results that were available during my care of the patient were reviewed by me and considered in my medical decision making (see chart for details).  I was notified by nursing staff that telemetry picked up a short run of V.  tach.  I was present at the bedside when this occurred.  The patient was tapping on his electrodes during the physical exam.    MDM Rules/Calculators/A&P                      This patient complains of upper abdominal pain, this involves an extensive number of treatment options, and is a complaint that carries with it a high risk of complications and morbidity.  The differential diagnosis includes pancreatitis, cholecystitis, GERD, gastritis.  Pertinent Labs I ordered, reviewed, and interpreted labs, which included lipase is mildly elevated at 58.  Creatinine is 1.54, which is about baseline for the patient.  LFTs are all normal.  T bili is normal at 0.8.  Imaging Interpretation I ordered imaging studies which included CT abdomen pelvis.  Per radiologist, CT showed gallstones, hepatic steatosis, and mildly enlarged appendix, but without definitive findings of appendicitis.  Medications I ordered medication Dilaudid for pain.  Reassessments After the interventions stated above, I reevaluated the patient and found feeling much improved.  Given that the patient has no abdominal pain on repeat exam, I feel the patient can likely be discharged home.  He does not have any right lower abdominal tenderness or periumbilical tenderness.  He does not have elevated white blood cell count, nor does he have fever.  My suspicion of appendicitis is very low.  Patient does not have any right upper quadrant tenderness or elevated LFTs.  I doubt cholecystitis.    I did discuss the CT findings with the patient and with Dr. Bebe Shaggy, both are in agreement with discharge at this time and strict return precautions.    Final Clinical Impression(s) / ED Diagnoses Final diagnoses:  Epigastric pain  Gallstones  Hepatic steatosis    Rx / DC Orders ED Discharge Orders    None       Roxy Horseman, PA-C 05/25/19 0230    Zadie Rhine, MD 05/25/19 [phone]

## 2019-05-24 NOTE — ED Triage Notes (Signed)
The pt started having abd pain after he ate a tatet tot casserole at 1730 nausea

## 2019-05-25 ENCOUNTER — Emergency Department (HOSPITAL_COMMUNITY): Payer: Self-pay

## 2019-05-25 MED ORDER — HYDROMORPHONE HCL 1 MG/ML IJ SOLN: 1.0000 mg | Freq: Once | INTRAMUSCULAR | Status: DC | PRN

## 2019-05-25 MED ORDER — IOHEXOL 300 MG/ML  SOLN
125.0000 mL | Freq: Once | INTRAMUSCULAR | Status: AC | PRN
  Administered 2019-05-25: 125 mL via INTRAVENOUS

## 2019-05-25 NOTE — Discharge Instructions (Addendum)
Your labs showed a mildly elevated lipase level, which is an enzyme from the pancreas.  This can indicate pancreatitis, but your CT scan didn't reveal any evidence of pancreatitis.  Your CT scan did have some incidental findings including gallstones, hepatic steatosis (which is fatty liver).  You should discuss these findings with your doctor.  Your CT also showed a slightly larger than normal appendix, but with signs concerning for definitive appendicitis.  We don't think this is anything you need to be concerned about at this time, but if you developed pain around your belly button or in the right lower side of your abdomen, you should return to the ER.  Additionally, if you have fever, worsening pain, or persistent vomiting please return to the ER.

## 2019-05-25 NOTE — ED Notes (Signed)
Patient verbalizes understanding of discharge instructions. Opportunity for questioning and answers were provided. Armband removed by staff, pt discharged from ED. Pt. ambulatory and discharged home.  

## 2019-05-25 NOTE — ED Provider Notes (Signed)
Patient seen/examined in the Emergency Department in conjunction with Midlevel Provider [facility] Patient reports abdominal pain and nausea Exam : Alert, no acute distress.  Patient's abdomen does appear distended, but no focal tenderness Plan: Labs and imaging are pending at this time   Zadie Rhine, MD 05/25/19 [phone]

## 2019-07-28 ENCOUNTER — Encounter: Payer: Self-pay | Admitting: Adult Health

## 2019-07-28 ENCOUNTER — Ambulatory Visit: Payer: No Typology Code available for payment source | Admitting: Adult Health

## 2019-08-08 IMAGING — CR DG CHEST 2V
2 series · 2 of 2 positions shown · non-contrast
Comparison: Chest radiograph 04/19/2015

CLINICAL DATA: Shortness of breath

EXAM:
CHEST  2 VIEW

[chest pa]
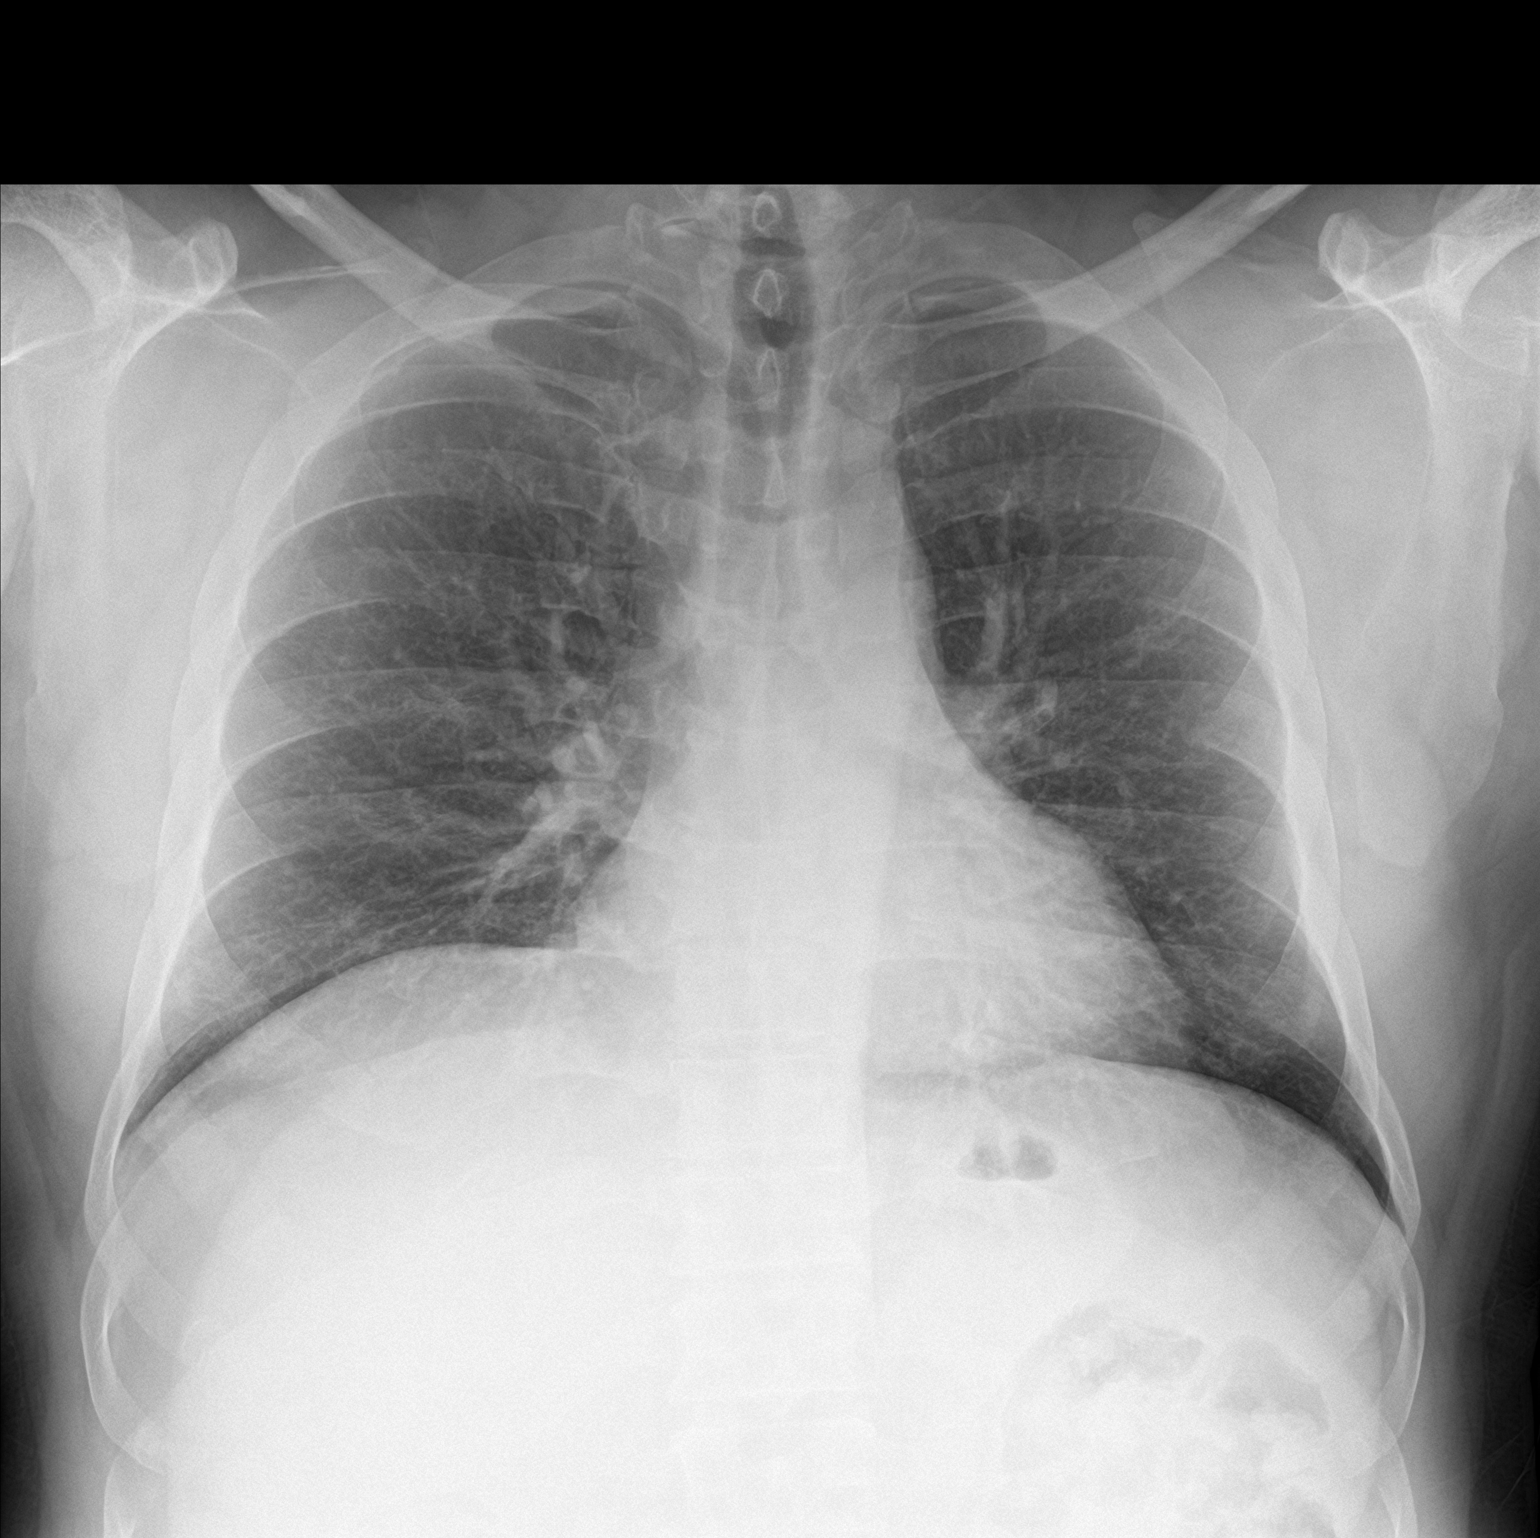

[chest lat]
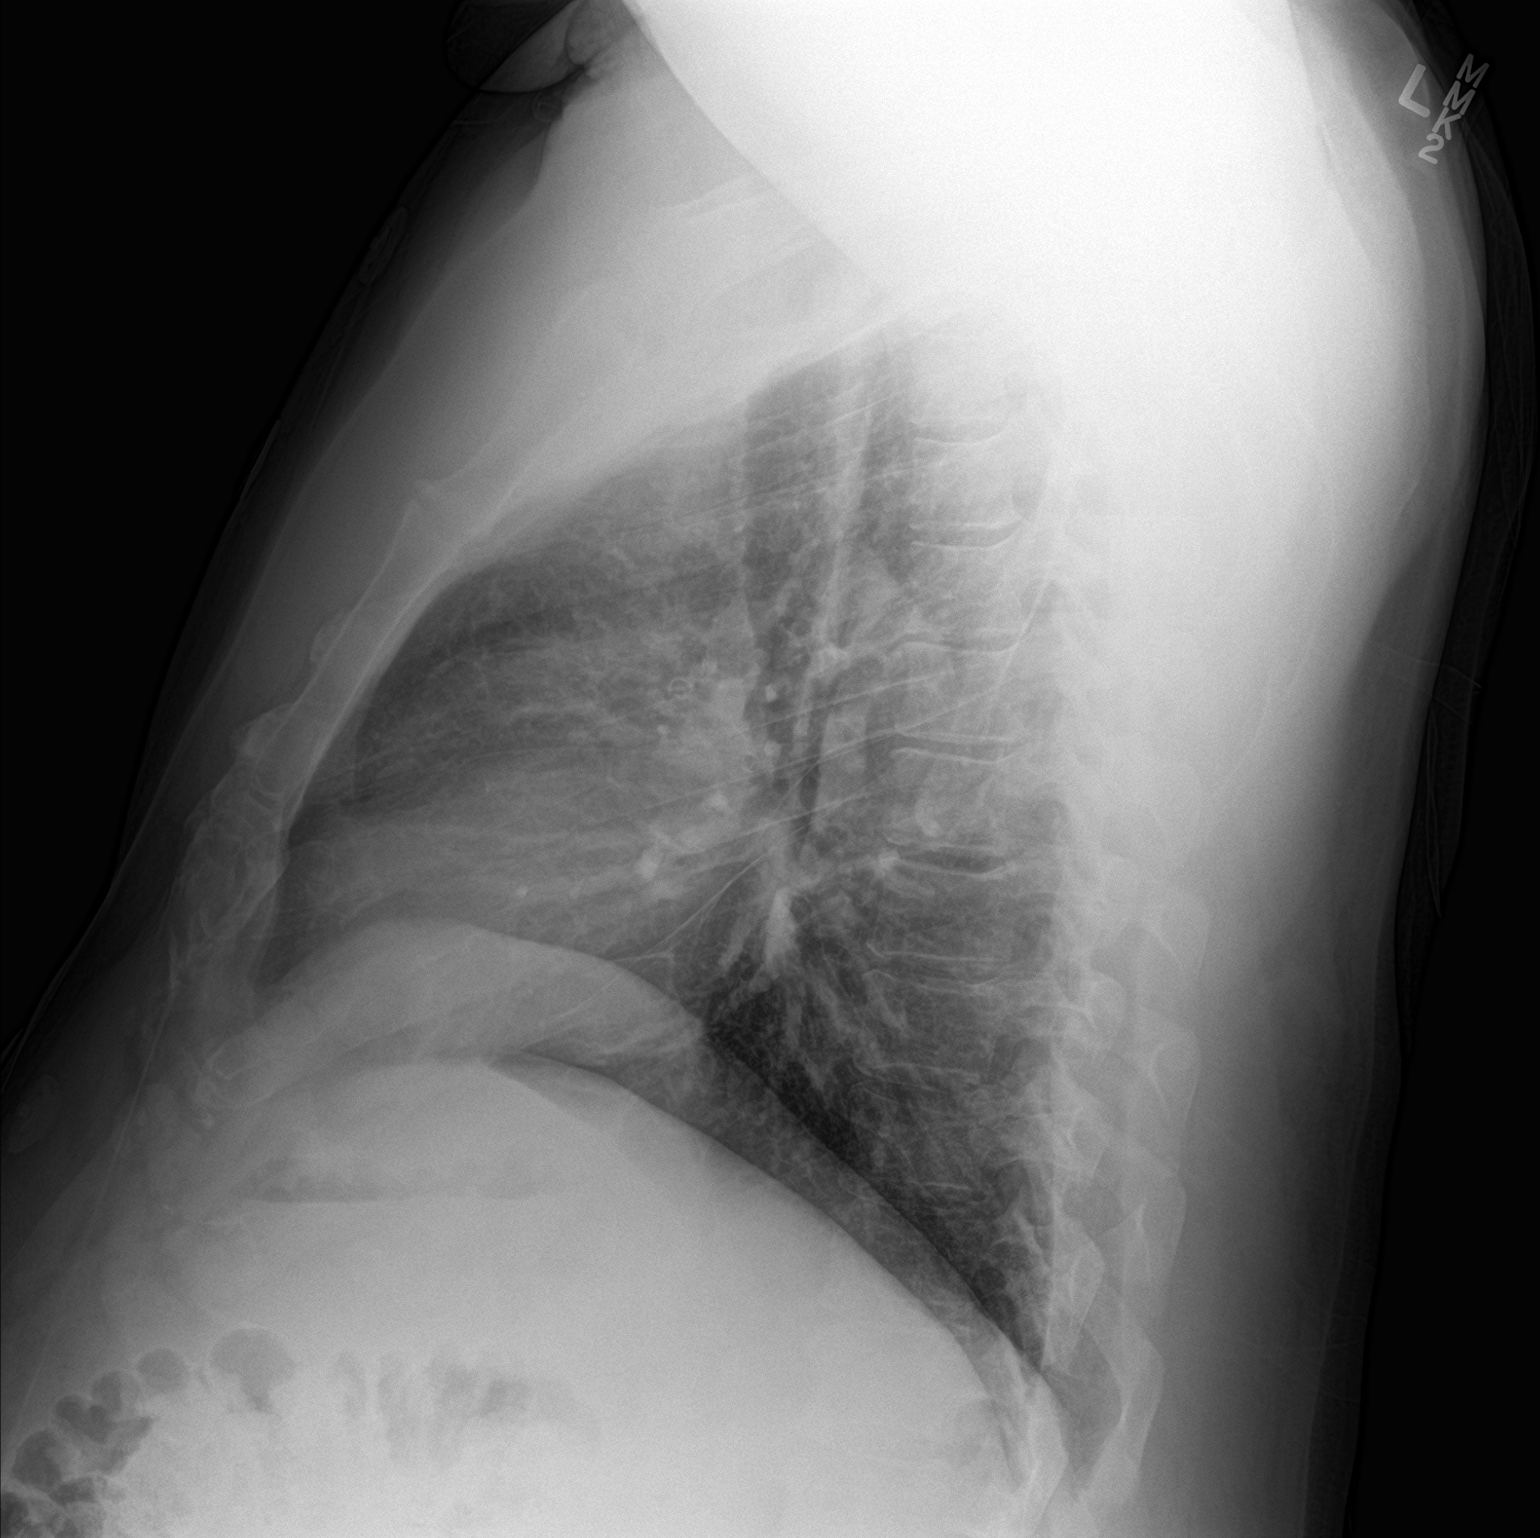

[2 of 2 positions shown; findings below may reference images not displayed]

FINDINGS: The heart size and mediastinal contours are within normal limits.
Both lungs are clear. The visualized skeletal structures are
unremarkable.
IMPRESSION: No active cardiopulmonary disease.

## 2019-09-23 ENCOUNTER — Other Ambulatory Visit: Payer: Self-pay | Admitting: Internal Medicine

## 2019-09-23 DIAGNOSIS — I10 Essential (primary) hypertension: Secondary | ICD-10-CM

## 2019-09-23 DIAGNOSIS — E1122 Type 2 diabetes mellitus with diabetic chronic kidney disease: Secondary | ICD-10-CM

## 2019-09-23 MED FILL — HYDROCHLOROTHIAZIDE 12.5 MG: 12.5 | 30 days supply | Qty: 30 | Fill #2

## 2019-09-23 MED FILL — AMLODIPINE BESYLATE 10 MG T: 10 | 30 days supply | Qty: 30 | Fill #0

## 2019-09-23 NOTE — Telephone Encounter (Signed)
Called and LM on VM for pt to call office make appt. 30 day courtesy RF given

## 2019-10-02 MED FILL — AMLODIPINE BESYLATE 10 MG T: 10 | 30 days supply | Qty: 30 | Fill #0

## 2019-11-20 ENCOUNTER — Other Ambulatory Visit: Payer: Self-pay

## 2019-11-20 ENCOUNTER — Encounter: Payer: Self-pay | Admitting: Internal Medicine

## 2019-11-20 ENCOUNTER — Ambulatory Visit: Payer: Self-pay | Attending: Internal Medicine | Admitting: Internal Medicine

## 2019-11-20 ENCOUNTER — Other Ambulatory Visit: Payer: Self-pay | Admitting: Internal Medicine

## 2019-11-20 ENCOUNTER — Ambulatory Visit (HOSPITAL_BASED_OUTPATIENT_CLINIC_OR_DEPARTMENT_OTHER): Payer: Self-pay | Admitting: Pharmacist

## 2019-11-20 DIAGNOSIS — Z23 Encounter for immunization: Secondary | ICD-10-CM

## 2019-11-20 DIAGNOSIS — N1831 Chronic kidney disease, stage 3a: Secondary | ICD-10-CM

## 2019-11-20 DIAGNOSIS — Z9114 Patient's other noncompliance with medication regimen: Secondary | ICD-10-CM

## 2019-11-20 DIAGNOSIS — I10 Essential (primary) hypertension: Secondary | ICD-10-CM

## 2019-11-20 DIAGNOSIS — E1169 Type 2 diabetes mellitus with other specified complication: Secondary | ICD-10-CM

## 2019-11-20 DIAGNOSIS — I663 Occlusion and stenosis of cerebellar arteries: Secondary | ICD-10-CM

## 2019-11-20 LAB — POCT GLYCOSYLATED HEMOGLOBIN (HGB A1C): HbA1c, POC (controlled diabetic range): 6.6 % (ref 0.0–7.0)

## 2019-11-20 LAB — GLUCOSE, POCT (MANUAL RESULT ENTRY): POC Glucose: 146 mg/dl — AB (ref 70–99)

## 2019-11-20 MED ORDER — ASPIRIN 81 MG PO TBEC: 81.0000 mg | DELAYED_RELEASE_TABLET | Freq: Every day | ORAL | 1 refills | Status: AC

## 2019-11-20 MED ORDER — METFORMIN HCL 500 MG PO TABS: 500.0000 mg | ORAL_TABLET | Freq: Every day | ORAL | 2 refills | Status: DC

## 2019-11-20 MED ORDER — ATORVASTATIN CALCIUM 80 MG PO TABS: 80.0000 mg | ORAL_TABLET | Freq: Every day | ORAL | 2 refills | Status: DC

## 2019-11-20 MED ORDER — HYDROCHLOROTHIAZIDE 12.5 MG PO TABS: 12.5000 mg | ORAL_TABLET | Freq: Every day | ORAL | 3 refills | Status: DC

## 2019-11-20 MED ORDER — AMLODIPINE BESYLATE 10 MG PO TABS: 10.0000 mg | ORAL_TABLET | Freq: Every day | ORAL | 1 refills | Status: DC

## 2019-11-20 MED FILL — ATORVASTATIN CALCIUM 80 MG: 80 | 30 days supply | Qty: 30 | Fill #0

## 2019-11-20 MED FILL — HYDROCHLOROTHIAZIDE 12.5 MG: 12.5 | 30 days supply | Qty: 30 | Fill #0

## 2019-11-20 MED FILL — METFORMIN HCL 500 MG TABS: 500 | 30 days supply | Qty: 30 | Fill #0

## 2019-11-20 MED FILL — AMLODIPINE BESYLATE 10 MG T: 10 | 30 days supply | Qty: 30 | Fill #0

## 2019-11-20 NOTE — Patient Instructions (Addendum)
Influenza Virus Vaccine injection (Fluarix) What is this medicine? INFLUENZA VIRUS VACCINE (in floo EN zuh VAHY ruhs vak SEEN) helps to reduce the risk of getting influenza also known as the flu. This medicine may be used for other purposes; ask your health care provider or pharmacist if you have questions. COMMON BRAND NAME(S): Fluarix, Fluzone What should I tell my health care provider before I take this medicine? They need to know if you have any of these conditions:  bleeding disorder like hemophilia  fever or infection  Guillain-Barre syndrome or other neurological problems  immune system problems  infection with the human immunodeficiency virus (HIV) or AIDS  low blood platelet counts  multiple sclerosis  an unusual or allergic reaction to influenza virus vaccine, eggs, chicken proteins, latex, gentamicin, other medicines, foods, dyes or preservatives  pregnant or trying to get pregnant  breast-feeding How should I use this medicine? This vaccine is for injection into a muscle. It is given by a health care professional. A copy of Vaccine Information Statements will be given before each vaccination. Read this sheet carefully each time. The sheet may change frequently. Talk to your pediatrician regarding the use of this medicine in children. Special care may be needed. Overdosage: If you think you have taken too much of this medicine contact a poison control center or emergency room at once. NOTE: This medicine is only for you. Do not share this medicine with others. What if I miss a dose? This does not apply. What may interact with this medicine?  chemotherapy or radiation therapy  medicines that lower your immune system like etanercept, anakinra, infliximab, and adalimumab  medicines that treat or prevent blood clots like warfarin  phenytoin  steroid medicines like prednisone or cortisone  theophylline  vaccines This list may not describe all possible  interactions. Give your health care provider a list of all the medicines, herbs, non-prescription drugs, or dietary supplements you use. Also tell them if you smoke, drink alcohol, or use illegal drugs. Some items may interact with your medicine. What should I watch for while using this medicine? Report any side effects that do not go away within 3 days to your doctor or health care professional. Call your health care provider if any unusual symptoms occur within 6 weeks of receiving this vaccine. You may still catch the flu, but the illness is not usually as bad. You cannot get the flu from the vaccine. The vaccine will not protect against colds or other illnesses that may cause fever. The vaccine is needed every year. What side effects may I notice from receiving this medicine? Side effects that you should report to your doctor or health care professional as soon as possible:  allergic reactions like skin rash, itching or hives, swelling of the face, lips, or tongue Side effects that usually do not require medical attention (report to your doctor or health care professional if they continue or are bothersome):  fever  headache  muscle aches and pains  pain, tenderness, redness, or swelling at site where injected  weak or tired This list may not describe all possible side effects. Call your doctor for medical advice about side effects. You may report side effects to FDA at 1-800-FDA-1088. Where should I keep my medicine? This vaccine is only given in a clinic, pharmacy, doctor's office, or other health care setting and will not be stored at home. NOTE: This sheet is a summary. It may not cover all possible information. If you have questions   about this medicine, talk to your doctor, pharmacist, or health care provider.  2020 Elsevier/Gold Standard (2007-07-24 09:30:40)  

## 2019-11-20 NOTE — Progress Notes (Signed)
Patient ID: Lonnie Brown, male    DOB: 30-Mar-1960  MRN: [id number]  CC: Annual Exam, Diabetes, and Hypertension   Subjective: Lonnie Brown is a 59 y.o. male who presents for chronic ds management His concerns today include:  History of HTN, CKDstage 3, obesity, HL, DM, ED, TIA, Cerebellar artery occlusion, thyroid nodule  DIABETES TYPE 2 Last A1C:   Results for orders placed or performed in visit on 11/20/19  POCT glucose (manual entry)  Result Value Ref Range   POC Glucose 146 (A) 70 - 99 mg/dl  POCT glycosylated hemoglobin (Hb A1C)  Result Value Ref Range   Hemoglobin A1C     HbA1c POC (<> result, manual entry)     HbA1c, POC (prediabetic range)     HbA1c, POC (controlled diabetic range) 6.6 0.0 - 7.0 %    Med Adherence: He is supposed to be on Metformin 1 g twice a day but admits that he has not been taking it consistently.   Medication side effects:  []  Yes    [x]  No Home Monitoring?  []  Yes    [x]  No Home glucose results range: Diet Adherence:  Reports eating habits getting better.  Eating cooked meals.  Gained 5 lbs since 03/2019 Exercise: not very active Hypoglycemic episodes?: []  Yes    []  No Numbness of the feet? []  Yes    []  No Retinopathy hx? []  Yes    []  No Last eye exam: blurred vision sometimes.  No insurance. Comments:   HYPERTENSION Currently taking: see medication list.  Took meds today but admits that he has not been taking medications consistently. Med Adherence: []  Yes    [x]  No Medication side effects: []  Yes    [x]  No Adherence with salt restriction: [x]  Yes    []  No Home Monitoring?: []  Yes    [x]  No Monitoring Frequency: []  Yes    []  No Home BP results range: []  Yes    []  No SOB? []  Yes    [x]  No Chest Pain?: []  Yes    [x]  No Leg swelling?: []  Yes    [x]  No Headaches?: []  Yes    [x]  No Dizziness? []  Yes    [x]  No Comments:    HM:  Did not do FIT test and not wanting to do it.  He has completed the COVID-19 vaccines.  He is  agreeable to getting his shot. Patient Active Problem List   Diagnosis Date Noted  . TIA (transient ischemic attack) 02/06/2019  . Cerebellar artery occlusion or stenosis 02/06/2019  . Thyroid enlarged 02/06/2019  . CVA (cerebral vascular accident) (HCC) 01/20/2019  . Hyperlipidemia associated with type 2 diabetes mellitus (HCC) 12/02/2018  . Chronic midline low back pain without sciatica 09/02/2018  . Controlled type 2 diabetes mellitus without complication, without long-term current use of insulin (HCC) 03/01/2018  . Erectile dysfunction 03/01/2018  . Noncompliance with medications 03/01/2018  . CKD (chronic kidney disease) stage 3, GFR 30-59 ml/min (HCC) 08/27/2017  . Hyperlipidemia 09/25/2016  . Morbid obesity (HCC) 07/20/2015  . Achilles tendinitis 04/09/2015  . Hypertension 03/04/2015     Current Outpatient Medications on File Prior to Visit  Medication Sig Dispense Refill  . sildenafil (VIAGRA) 50 MG tablet 1 tab half hour to 1 hour before sexual intercourse.  Limit to 1 tablet per 24-hour. (Patient taking differently: Take 50 mg by mouth as needed for erectile dysfunction. ) 10 tablet 5   No current facility-administered medications on file prior to  visit.    Allergies  Allergen Reactions  . Lisinopril Other (See Comments)    Angioedema  . Banana Nausea And Vomiting    Social History   Socioeconomic History  . Marital status: Married    Spouse name: Not on file  . Number of children: Not on file  . Years of education: Not on file  . Highest education level: Not on file  Occupational History  . Not on file  Tobacco Use  . Smoking status: Never Smoker  . Smokeless tobacco: Never Used  Vaping Use  . Vaping Use: Never used  Substance and Sexual Activity  . Alcohol use: No  . Drug use: No  . Sexual activity: Not on file  Other Topics Concern  . Not on file  Social History Narrative  . Not on file   Social Determinants of Health   Financial Resource  Strain:   . Difficulty of Paying Living Expenses: Not on file  Food Insecurity:   . Worried About Programme researcher, broadcasting/film/video in the Last Year: Not on file  . Ran Out of Food in the Last Year: Not on file  Transportation Needs:   . Lack of Transportation (Medical): Not on file  . Lack of Transportation (Non-Medical): Not on file  Physical Activity:   . Days of Exercise per Week: Not on file  . Minutes of Exercise per Session: Not on file  Stress:   . Feeling of Stress : Not on file  Social Connections:   . Frequency of Communication with Friends and Family: Not on file  . Frequency of Social Gatherings with Friends and Family: Not on file  . Attends Religious Services: Not on file  . Active Member of Clubs or Organizations: Not on file  . Attends Banker Meetings: Not on file  . Marital Status: Not on file  Intimate Partner Violence:   . Fear of Current or Ex-Partner: Not on file  . Emotionally Abused: Not on file  . Physically Abused: Not on file  . Sexually Abused: Not on file    Family History  Problem Relation Age of Onset  . Diabetes Mother   . Hypertension Mother   . Alcoholism Father     Past Surgical History:  Procedure Laterality Date  . EYE SURGERY    . TONSILLECTOMY      ROS: Review of Systems Negative except as stated above  PHYSICAL EXAM: BP 133/84   Pulse 90   Resp 16   Ht 6' (1.829 m)   Wt (!) 314 lb 9.6 oz (142.7 kg)   SpO2 96%   BMI 42.67 kg/m   Wt Readings from Last 3 Encounters:  11/20/19 (!) 314 lb 9.6 oz (142.7 kg)  05/24/19 (!) 309 lb 4.9 oz (140.3 kg)  03/26/19 (!) 309 lb 3.2 oz (140.3 kg)    Physical Exam  General appearance - alert, well appearing, obese African-American male and in no distress Mental status - normal mood, behavior, speech, dress, motor activity, and thought processes Mouth - mucous membranes moist, pharynx normal without lesions Neck - supple, no significant adenopathy Breasts: Mild gynecomastia Chest -  clear to auscultation, no wheezes, rales or rhonchi, symmetric air entry Heart - normal rate, regular rhythm, normal S1, S2, no murmurs, rubs, clicks or gallops Extremities -trace lower extremity edema  CMP Latest Ref Rng & Units 05/24/2019 01/22/2019 01/20/2019  Glucose 70 - 99 mg/dL 865(H) 846(N) 629(B)  BUN 6 - 20 mg/dL 17 15 17  Creatinine 0.61 - 1.24 mg/dL 0.10(U) 7.25(D) 6.64(Q)  Sodium 135 - 145 mmol/L 137 138 139  Potassium 3.5 - 5.1 mmol/L 3.9 4.3 4.1  Chloride 98 - 111 mmol/L 102 103 104  CO2 22 - 32 mmol/L 26 27 -  Calcium 8.9 - 10.3 mg/dL 9.8 9.0 -  Total Protein 6.5 - 8.1 g/dL 0.3(K) - -  Total Bilirubin 0.3 - 1.2 mg/dL 0.8 - -  Alkaline Phos 38 - 126 U/L 77 - -  AST 15 - 41 U/L 24 - -  ALT 0 - 44 U/L 36 - -   Lipid Panel     Component Value Date/Time   CHOL 129 01/20/2019 1330   CHOL 131 09/02/2018 1645   TRIG 68 01/20/2019 1330   HDL 30 (L) 01/20/2019 1330   HDL 32 (L) 09/02/2018 1645   CHOLHDL 4.3 01/20/2019 1330   VLDL 14 01/20/2019 1330   LDLCALC 85 01/20/2019 1330   LDLCALC 73 09/02/2018 1645    CBC    Component Value Date/Time   WBC 7.0 05/24/2019 2102   RBC 5.45 05/24/2019 2102   HGB 15.6 05/24/2019 2102   HGB 13.1 09/02/2018 1645   HCT 48.1 05/24/2019 2102   HCT 39.6 09/02/2018 1645   PLT 262 05/24/2019 2102   PLT 272 09/02/2018 1645   MCV 88.3 05/24/2019 2102   MCV 89 09/02/2018 1645   MCH 28.6 05/24/2019 2102   MCHC 32.4 05/24/2019 2102   RDW 13.2 05/24/2019 2102   RDW 13.7 09/02/2018 1645   LYMPHSABS 1.8 01/20/2019 0736   MONOABS 0.7 01/20/2019 0736   EOSABS 0.1 01/20/2019 0736   BASOSABS 0.0 01/20/2019 0736    ASSESSMENT AND PLAN:  1. Type 2 diabetes mellitus with morbid obesity (HCC) Commended him that his diabetes is well controlled considering he has not been taking the Metformin consistently.  However I recommend continuing Metformin but decreasing the dose to 500 mg once a day.  Healthy eating habits discussed and encouraged.   Advised him to try to move more as tolerated. Encouraged him to get an eye exam when he is able to afford. - POCT glucose (manual entry) - POCT glycosylated hemoglobin (Hb A1C) - metFORMIN (GLUCOPHAGE) 500 MG tablet; Take 1 tablet (500 mg total) by mouth daily with breakfast.  Dispense: 90 tablet; Refill: 2 - Microalbumin / creatinine urine ratio  2. Essential hypertension Not at goal.  He admits that he has not been compliant with medications.  Stressed the importance of good blood pressure control given his history of CKD and TIA earlier this year. - Basic Metabolic Panel - hydrochlorothiazide (HYDRODIURIL) 12.5 MG tablet; Take 1 tablet (12.5 mg total) by mouth daily.  Dispense: 90 tablet; Refill: 3 - amLODipine (NORVASC) 10 MG tablet; Take 1 tablet (10 mg total) by mouth daily.  Dispense: 90 tablet; Refill: 1  3. Stage 3a chronic kidney disease (HCC) Advised to avoid NSAIDs.  4. Cerebellar artery occlusion or stenosis Encouraged him to take the aspirin daily along with the atorvastatin - atorvastatin (LIPITOR) 80 MG tablet; Take 1 tablet (80 mg total) by mouth daily at 6 PM.  Dispense: 90 tablet; Refill: 2 - aspirin 81 MG EC tablet; Take 1 tablet (81 mg total) by mouth daily.  Dispense: 90 tablet; Refill: 1  5. Need for influenza vaccination Given today.  6. Noncompliance with medications Encourage compliance with medications to prevent cardiovascular events.   Patient was given the opportunity to ask questions.  Patient verbalized understanding of  the plan and was able to repeat key elements of the plan.   Orders Placed This Encounter  Procedures  . Basic Metabolic Panel  . Microalbumin / creatinine urine ratio  . POCT glucose (manual entry)  . POCT glycosylated hemoglobin (Hb A1C)     Requested Prescriptions   Signed Prescriptions Disp Refills  . metFORMIN (GLUCOPHAGE) 500 MG tablet 90 tablet 2    Sig: Take 1 tablet (500 mg total) by mouth daily with breakfast.  .  hydrochlorothiazide (HYDRODIURIL) 12.5 MG tablet 90 tablet 3    Sig: Take 1 tablet (12.5 mg total) by mouth daily.  Marland Kitchen. atorvastatin (LIPITOR) 80 MG tablet 90 tablet 2    Sig: Take 1 tablet (80 mg total) by mouth daily at 6 PM.  . amLODipine (NORVASC) 10 MG tablet 90 tablet 1    Sig: Take 1 tablet (10 mg total) by mouth daily.  Marland Kitchen. aspirin 81 MG EC tablet 90 tablet 1    Sig: Take 1 tablet (81 mg total) by mouth daily.    Return in about 4 months (around 03/19/2020).  Jonah Blueeborah Kendallyn Lippold, MD, FACP

## 2019-11-20 NOTE — Progress Notes (Signed)
Patient presents for vaccination against influenza per orders of Dr. Johnson. Consent given. Counseling provided. No contraindications exists. Vaccine administered without incident.  ° °Luke Van Ausdall, PharmD, CPP °Clinical Pharmacist °[facility] °[phone] ° °

## 2019-11-21 ENCOUNTER — Telehealth: Payer: Self-pay

## 2019-11-21 LAB — BASIC METABOLIC PANEL
BUN/Creatinine Ratio: 14 (ref 10–24)
BUN: 19 mg/dL (ref 8–27)
CO2: 25 mmol/L (ref 20–29)
Calcium: 9.7 mg/dL (ref 8.6–10.2)
Chloride: 101 mmol/L (ref 96–106)
Creatinine, Ser: 1.38 mg/dL — ABNORMAL HIGH (ref 0.76–1.27)
GFR calc Af Amer: 63 mL/min/{1.73_m2} (ref >59)
GFR calc non Af Amer: 55 mL/min/{1.73_m2} — ABNORMAL LOW (ref >59)
Glucose: 112 mg/dL — ABNORMAL HIGH (ref 65–99)
Potassium: 4.5 mmol/L (ref 3.5–5.2)
Sodium: 142 mmol/L (ref 134–144)

## 2019-11-21 LAB — MICROALBUMIN / CREATININE URINE RATIO
Creatinine, Urine: 288.7 mg/dL
Microalb/Creat Ratio: 11 mg/g creat (ref 0–29)
Microalbumin, Urine: 30.8 ug/mL

## 2019-11-21 NOTE — Progress Notes (Signed)
Let patient know that his kidney function is not 100% but improved compared to 6 months ago.

## 2019-11-21 NOTE — Telephone Encounter (Signed)
Contacted pt to go over lab results pt didn't answer lvm  

## 2020-01-05 ENCOUNTER — Ambulatory Visit: Payer: Self-pay | Attending: Internal Medicine

## 2020-01-05 DIAGNOSIS — Z23 Encounter for immunization: Secondary | ICD-10-CM

## 2020-01-05 NOTE — Progress Notes (Signed)
   Covid-19 Vaccination Clinic  Name:  Vivan Agostino    MRN: [id number] DOB: Feb 05, 1960  01/05/2020  Mr. Duerson was observed post Covid-19 immunization for 15 minutes without incident. He was provided with Vaccine Information Sheet and instruction to access the V-Safe system.   Mr. Meisenheimer was instructed to call 911 with any severe reactions post vaccine: Marland Kitchen Difficulty breathing  . Swelling of face and throat  . A fast heartbeat  . A bad rash all over body  . Dizziness and weakness   Immunizations Administered    Name Date Dose VIS Date Route   Pfizer COVID-19 Vaccine 01/05/2020  2:58 PM 0.3 mL 10/29/2019 Intramuscular   Manufacturer: [employer], [employer]   Lot: [id number]   NDC: [address]-5

## 2020-01-22 MED FILL — METFORMIN HCL 500 MG TABS: 500 | 30 days supply | Qty: 30 | Fill #1

## 2020-04-21 ENCOUNTER — Other Ambulatory Visit: Payer: Self-pay

## 2020-04-21 MED FILL — Hydrochlorothiazide Cap 12.5 MG: ORAL | 30 days supply | Qty: 30 | Fill #0 | Status: AC

## 2020-04-21 MED FILL — Metformin HCl Tab 500 MG: ORAL | 30 days supply | Qty: 30 | Fill #0 | Status: AC

## 2020-04-21 MED FILL — Amlodipine Besylate Tab 10 MG (Base Equivalent): ORAL | 30 days supply | Qty: 30 | Fill #0 | Status: AC

## 2020-04-21 MED FILL — Atorvastatin Calcium Tab 80 MG (Base Equivalent): ORAL | 30 days supply | Qty: 30 | Fill #0 | Status: AC

## 2020-04-22 ENCOUNTER — Other Ambulatory Visit: Payer: Self-pay

## 2020-06-14 ENCOUNTER — Other Ambulatory Visit (HOSPITAL_BASED_OUTPATIENT_CLINIC_OR_DEPARTMENT_OTHER): Payer: Self-pay

## 2020-06-14 ENCOUNTER — Ambulatory Visit: Payer: Self-pay

## 2020-06-17 ENCOUNTER — Ambulatory Visit: Payer: Self-pay | Attending: Internal Medicine

## 2020-06-17 ENCOUNTER — Other Ambulatory Visit (HOSPITAL_BASED_OUTPATIENT_CLINIC_OR_DEPARTMENT_OTHER): Payer: Self-pay

## 2020-06-17 ENCOUNTER — Other Ambulatory Visit: Payer: Self-pay

## 2020-06-17 DIAGNOSIS — Z23 Encounter for immunization: Secondary | ICD-10-CM

## 2020-06-17 MED ORDER — COVID-19 MRNA VACC (MODERNA) 100 MCG/0.5ML IM SUSP
INTRAMUSCULAR | 0 refills | Status: DC
  Filled 2020-06-17: qty 0.25, 1d supply, fill #0

## 2020-06-17 NOTE — Progress Notes (Signed)
   Covid-19 Vaccination Clinic  Name:  Lonnie Brown    MRN: [id number] DOB: 04-Mar-1960  06/17/2020  [name] was observed post Covid-19 immunization for 15 minutes without incident. He was provided with Vaccine Information Sheet and instruction to access the V-Safe system.   [name] was instructed to call 911 with any severe reactions post vaccine: Difficulty breathing  Swelling of face and throat  A fast heartbeat  A bad rash all over body  Dizziness and weakness   Immunizations Administered     Name Date Dose VIS Date Route   Moderna Covid-19 Booster Vaccine 06/17/2020  3:17 PM 0.25 mL 10/29/2019 Intramuscular   Manufacturer: Moderna   Lot: [id number]   NDC: 21115-520-80

## 2020-07-06 ENCOUNTER — Other Ambulatory Visit: Payer: Self-pay

## 2020-07-08 ENCOUNTER — Other Ambulatory Visit: Payer: Self-pay

## 2020-07-08 MED FILL — Hydrochlorothiazide Cap 12.5 MG: ORAL | 30 days supply | Qty: 30 | Fill #1 | Status: CN

## 2020-07-08 MED FILL — Amlodipine Besylate Tab 10 MG (Base Equivalent): ORAL | 30 days supply | Qty: 30 | Fill #1 | Status: CN

## 2020-07-08 MED FILL — Atorvastatin Calcium Tab 80 MG (Base Equivalent): ORAL | 30 days supply | Qty: 30 | Fill #1 | Status: CN

## 2020-07-08 MED FILL — Metformin HCl Tab 500 MG: ORAL | 30 days supply | Qty: 30 | Fill #1 | Status: CN

## 2020-07-13 ENCOUNTER — Other Ambulatory Visit: Payer: Self-pay

## 2020-07-15 ENCOUNTER — Other Ambulatory Visit: Payer: Self-pay

## 2020-07-15 MED FILL — Amlodipine Besylate Tab 10 MG (Base Equivalent): ORAL | 30 days supply | Qty: 30 | Fill #1 | Status: AC

## 2020-07-15 MED FILL — Hydrochlorothiazide Cap 12.5 MG: ORAL | 30 days supply | Qty: 30 | Fill #1 | Status: AC

## 2020-07-15 MED FILL — Metformin HCl Tab 500 MG: ORAL | 30 days supply | Qty: 30 | Fill #1 | Status: AC

## 2020-07-15 MED FILL — Atorvastatin Calcium Tab 80 MG (Base Equivalent): ORAL | 30 days supply | Qty: 30 | Fill #1 | Status: AC

## 2020-07-16 ENCOUNTER — Other Ambulatory Visit: Payer: Self-pay

## 2020-08-10 ENCOUNTER — Other Ambulatory Visit: Payer: Self-pay

## 2020-08-10 ENCOUNTER — Encounter: Payer: Self-pay | Admitting: Internal Medicine

## 2020-08-10 ENCOUNTER — Ambulatory Visit: Payer: Self-pay | Attending: Internal Medicine | Admitting: Internal Medicine

## 2020-08-10 DIAGNOSIS — Z23 Encounter for immunization: Secondary | ICD-10-CM

## 2020-08-10 DIAGNOSIS — E1169 Type 2 diabetes mellitus with other specified complication: Secondary | ICD-10-CM

## 2020-08-10 DIAGNOSIS — I10 Essential (primary) hypertension: Secondary | ICD-10-CM

## 2020-08-10 DIAGNOSIS — E785 Hyperlipidemia, unspecified: Secondary | ICD-10-CM

## 2020-08-10 DIAGNOSIS — N1831 Chronic kidney disease, stage 3a: Secondary | ICD-10-CM

## 2020-08-10 LAB — POCT GLYCOSYLATED HEMOGLOBIN (HGB A1C): HbA1c, POC (controlled diabetic range): 6.9 % (ref 0.0–7.0)

## 2020-08-10 LAB — GLUCOSE, POCT (MANUAL RESULT ENTRY): POC Glucose: 138 mg/dl — AB (ref 70–99)

## 2020-08-10 NOTE — Progress Notes (Signed)
Patient ID: Lonnie Brown, male    DOB: 13-May-1960  MRN: [id number]  CC: Diabetes and Hypertension   Subjective: Lonnie Brown is a 60 y.o. male who presents for chronic ds management His concerns today include:  History of HTN, CKD stage 3, obesity, HL, DM, ED, TIA, Cerebellar artery occlusion, thyroid nodule   Did not bring meds with him.   HYPERTENSION Currently taking: see medication list.  Should be on Norvasc and HCTZ Med Adherence: not taking meds consistently because "I don't like taking them every day." Reports taking them today Medication side effects: []  Yes    [x]  No Adherence with salt restriction: [x]  Yes    []  No Home Monitoring?: []  Yes    [x]  No Monitoring Frequency:  Home BP results range:  SOB? []  Yes    [x]  No Chest Pain?: []  Yes    [x]  No Leg swelling?: []  Yes    [x]  No Headaches?: []  Yes    [x]  No Dizziness? []  Yes    [x]  No Comments:   DIABETES TYPE 2/Obesity Last A1C:   Results for orders placed or performed in visit on 08/10/20  POCT glucose (manual entry)  Result Value Ref Range   POC Glucose 138 (A) 70 - 99 mg/dl  POCT glycosylated hemoglobin (Hb A1C)  Result Value Ref Range   Hemoglobin A1C     HbA1c POC (<> result, manual entry)     HbA1c, POC (prediabetic range)     HbA1c, POC (controlled diabetic range) 6.9 0.0 - 7.0 %    Med Adherence:  []  Yes    [x]  No - not taking Metformin every day for same reason he is not taking his BP meds every day Medication side effects:  []  Yes    [x]  No Home Monitoring?  []  Yes    [x]  No Home glucose results range: Diet Adherence: feels he is doing better. 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  Weight is down 9 pounds since November of last year. Hypoglycemic episodes?: []  Yes    []  No Numbness of the feet? [x]  Yes -still has LT side numbness from stroke last yr Retinopathy hx? []   Yes    []  No Last eye exam: over due for eye exam.  Now has insurance but does not have card Comments:   HL:  taking atorvastatin but again not consistently as he should.  He takes aspirin on and off.  CKD 3: GFR has been 50-63.  Creat range 1.38-1.5.  Not on NSAIDS.  Makes good urine.  No microalbuminuria  HM:  have had 3 COVID shots Patient Active Problem List   Diagnosis Date Noted   TIA (transient ischemic attack) 02/06/2019   Cerebellar artery occlusion or stenosis 02/06/2019   Thyroid enlarged 02/06/2019   CVA (cerebral vascular accident) (HCC) 01/20/2019   Hyperlipidemia associated with type 2 diabetes mellitus (HCC) 12/02/2018   Chronic midline low back pain without sciatica 09/02/2018   Controlled type 2 diabetes mellitus without complication, without long-term current use of insulin (HCC) 03/01/2018   Erectile dysfunction 03/01/2018   Noncompliance with medications 03/01/2018   CKD (chronic kidney disease) stage 3, GFR 30-59 ml/min (HCC) 08/27/2017   Hyperlipidemia 09/25/2016   Morbid obesity (HCC) 07/20/2015   Achilles tendinitis 04/09/2015   Hypertension 03/04/2015     Current Outpatient Medications on File Prior to  Visit  Medication Sig Dispense Refill   amLODipine (NORVASC) 10 MG tablet TAKE 1 TABLET (10 MG TOTAL) BY MOUTH DAILY. 90 tablet 1   aspirin 81 MG EC tablet Take 1 tablet (81 mg total) by mouth daily. 90 tablet 1   atorvastatin (LIPITOR) 80 MG tablet TAKE 1 TABLET (80 MG TOTAL) BY MOUTH DAILY AT 6 PM. 90 tablet 2   COVID-19 mRNA vaccine, Moderna, 100 MCG/0.5ML injection Inject into the muscle. 0.25 mL 0   hydrochlorothiazide (HYDRODIURIL) 12.5 MG tablet Take 1 tablet (12.5 mg total) by mouth daily. 90 tablet 3   hydrochlorothiazide (MICROZIDE) 12.5 MG capsule TAKE 1 TABLET (12.5 MG TOTAL) BY MOUTH DAILY. 90 capsule 3   metFORMIN (GLUCOPHAGE) 500 MG tablet TAKE 1 TABLET (500 MG TOTAL) BY MOUTH DAILY WITH BREAKFAST. 90 tablet 2   sildenafil (VIAGRA) 50 MG  tablet 1 tab half hour to 1 hour before sexual intercourse.  Limit to 1 tablet per 24-hour. (Patient taking differently: Take 50 mg by mouth as needed for erectile dysfunction. ) 10 tablet 5   No current facility-administered medications on file prior to visit.    Allergies  Allergen Reactions   Lisinopril Other (See Comments)    Angioedema   Banana Nausea And Vomiting    Social History   Socioeconomic History   Marital status: Married    Spouse name: Not on file   Number of children: Not on file   Years of education: Not on file   Highest education level: Not on file  Occupational History   Not on file  Tobacco Use   Smoking status: Never   Smokeless tobacco: Never  Vaping Use   Vaping Use: Never used  Substance and Sexual Activity   Alcohol use: No   Drug use: No   Sexual activity: Not on file  Other Topics Concern   Not on file  Social History Narrative   Not on file   Social Determinants of Health   Financial Resource Strain: Not on file  Food Insecurity: Not on file  Transportation Needs: Not on file  Physical Activity: Not on file  Stress: Not on file  Social Connections: Not on file  Intimate Partner Violence: Not on file    Family History  Problem Relation Age of Onset   Diabetes Mother    Hypertension Mother    Alcoholism Father     Past Surgical History:  Procedure Laterality Date   EYE SURGERY     TONSILLECTOMY      ROS: Review of Systems Negative except as stated above  PHYSICAL EXAM: BP (!) 158/99   Pulse 82   Resp 16   Ht 5\' 11"  (1.803 m)   Wt (!) 305 lb 9.6 oz (138.6 kg)   SpO2 95%   BMI 42.62 kg/m   Wt Readings from Last 3 Encounters:  08/10/20 (!) 305 lb 9.6 oz (138.6 kg)  11/20/19 (!) 314 lb 9.6 oz (142.7 kg)  05/24/19 (!) 309 lb 4.9 oz (140.3 kg)    Physical Exam   General appearance - alert, well appearing, and in no distress Mental status - normal mood, behavior, speech, dress, motor activity, and thought  processes Neck - supple, no significant adenopathy Chest - clear to auscultation, no wheezes, rales or rhonchi, symmetric air entry Heart - normal rate, regular rhythm, normal S1, S2, no murmurs, rubs, clicks or gallops Extremities - peripheral pulses normal, no pedal edema, no clubbing or cyanosis  CMP Latest Ref Rng &  Units 11/20/2019 05/24/2019 01/22/2019  Glucose 65 - 99 mg/dL 062(B) 762(G) 315(V)  BUN 8 - 27 mg/dL 19 17 15   Creatinine 0.76 - 1.27 mg/dL ) 7.61(Y) 0.73(X)  Sodium 134 - 144 mmol/L 142 137 138  Potassium 3.5 - 5.2 mmol/L 4.5 3.9 4.3  Chloride 96 - 106 mmol/L 101 102 103  CO2 20 - 29 mmol/L 25 26 27   Calcium 8.6 - 10.2 mg/dL 9.7 9.8 9.0  Total Protein 6.5 - 8.1 g/dL - 8.4(H) -  Total Bilirubin 0.3 - 1.2 mg/dL - 0.8 -  Alkaline Phos 38 - 126 U/L - 77 -  AST 15 - 41 U/L - 24 -  ALT 0 - 44 U/L - 36 -   Lipid Panel     Component Value Date/Time   CHOL 129 01/20/2019 1330   CHOL 131 09/02/2018 1645   TRIG 68 01/20/2019 1330   HDL 30 (L) 01/20/2019 1330   HDL 32 (L) 09/02/2018 1645   CHOLHDL 4.3 01/20/2019 1330   VLDL 14 01/20/2019 1330   LDLCALC 85 01/20/2019 1330   LDLCALC 73 09/02/2018 1645    CBC    Component Value Date/Time   WBC 7.0 05/24/2019 2102   RBC 5.45 05/24/2019 2102   HGB 15.6 05/24/2019 2102   HGB 13.1 09/02/2018 1645   HCT 48.1 05/24/2019 2102   HCT 39.6 09/02/2018 1645   PLT 262 05/24/2019 2102   PLT 272 09/02/2018 1645   MCV 88.3 05/24/2019 2102   MCV 89 09/02/2018 1645   MCH 28.6 05/24/2019 2102   MCHC 32.4 05/24/2019 2102   RDW 13.2 05/24/2019 2102   RDW 13.7 09/02/2018 1645   LYMPHSABS 1.8 01/20/2019 0736   MONOABS 0.7 01/20/2019 0736   EOSABS 0.1 01/20/2019 0736   BASOSABS 0.0 01/20/2019 0736    ASSESSMENT AND PLAN: 1. Type 2 diabetes mellitus with morbid obesity (HCC) Diabetes is under good control.  Encouraged him to take the metformin consistently. Discussed and encourage healthy eating habits.  Encouraged him to  cut back on sugary drinks including energy drinks that are filled with caffeine.  Encouraged him to stay active.  Encouraged to get his eye exam done. - POCT glucose (manual entry) - POCT glycosylated hemoglobin (Hb A1C) - Ambulatory referral to Ophthalmology - CBC - Comprehensive metabolic panel  2. Essential hypertension Not at goal.  No changes made in his medicines today since he has not been taking medicines consistently. Discussed health risks associated with uncontrolled blood pressure.  Encouraged him to take his medications consistently every day.  Encouraged him to purchase a med box so that he is able to fill it once a week and this will help remind him to take his medicines every day. -Follow-up with clinical pharmacist in 3 to 4 weeks for recheck of blood pressure.  3. Hyperlipidemia associated with type 2 diabetes mellitus (HCC) Continue atorvastatin.  Check lipid profile today to see if his LDL is at goal. - Lipid panel  4. Stage 3a chronic kidney disease (HCC) Advised to avoid NSAIDs.  Check chemistry today to make sure his creatinine and GFR are stable.  Discussed the importance of good diabetes and blood pressure control to help prevent progression of kidney disease.  5. Need for shingles vaccine Patient agreeable to getting the first shot of his Shingrix vaccine today.  We will give the second shot on his subsequent visit.   Patient was given the opportunity to ask questions.  Patient verbalized understanding of the plan  and was able to repeat key elements of the plan.   No orders of the defined types were placed in this encounter.    Requested Prescriptions    No prescriptions requested or ordered in this encounter    No follow-ups on file.  Jonah Blue, MD, FACP

## 2020-08-10 NOTE — Patient Instructions (Signed)
I recommend purchasing a medication box so that you are able to fell it once a week to help remind yourself to take your medicines.  Healthy Eating Following a healthy eating pattern may help you to achieve and maintain a healthy body weight, reduce the risk of chronic disease, and live a long and productive life. It is important to follow a healthy eating pattern at an appropriate calorie level for your body. Your nutritional needs should be metprimarily through food by choosing a variety of nutrient-rich foods. What are tips for following this plan? Reading food labels Read labels and choose the following: Reduced or low sodium. Juices with 100% fruit juice. Foods with low saturated fats and high polyunsaturated and monounsaturated fats. Foods with whole grains, such as whole wheat, cracked wheat, brown rice, and wild rice. Whole grains that are fortified with folic acid. This is recommended for women who are pregnant or who want to become pregnant. Read labels and avoid the following: Foods with a lot of added sugars. These include foods that contain brown sugar, corn sweetener, corn syrup, dextrose, fructose, glucose, high-fructose corn syrup, honey, invert sugar, lactose, malt syrup, maltose, molasses, raw sugar, sucrose, trehalose, or turbinado sugar. Do not eat more than the following amounts of added sugar per day: 6 teaspoons (25 g) for women. 9 teaspoons (38 g) for men. Foods that contain processed or refined starches and grains. Refined grain products, such as white flour, degermed cornmeal, white bread, and white rice. Shopping Choose nutrient-rich snacks, such as vegetables, whole fruits, and nuts. Avoid high-calorie and high-sugar snacks, such as potato chips, fruit snacks, and candy. Use oil-based dressings and spreads on foods instead of solid fats such as butter, stick margarine, or cream cheese. Limit pre-made sauces, mixes, and "instant" products such as flavored rice,  instant noodles, and ready-made pasta. Try more plant-protein sources, such as tofu, tempeh, black beans, edamame, lentils, nuts, and seeds. Explore eating plans such as the Mediterranean diet or vegetarian diet. Cooking Use oil to saut or stir-fry foods instead of solid fats such as butter, stick margarine, or lard. Try baking, boiling, grilling, or broiling instead of frying. Remove the fatty part of meats before cooking. Steam vegetables in water or broth. Meal planning  At meals, imagine dividing your plate into fourths: One-half of your plate is fruits and vegetables. One-fourth of your plate is whole grains. One-fourth of your plate is protein, especially lean meats, poultry, eggs, tofu, beans, or nuts. Include low-fat dairy as part of your daily diet.  Lifestyle Choose healthy options in all settings, including home, work, school, restaurants, or stores. Prepare your food safely: Wash your hands after handling raw meats. Keep food preparation surfaces clean by regularly washing with hot, soapy water. Keep raw meats separate from ready-to-eat foods, such as fruits and vegetables. Cook seafood, meat, poultry, and eggs to the recommended internal temperature. Store foods at safe temperatures. In general: Keep cold foods at 82F (4.4C) or below. Keep hot foods at 182F (60C) or above. Keep your freezer at [facility] (-17.8C) or below. Foods are no longer safe to eat when they have been between the temperatures of 40-182F (4.4-60C) for more than 2 hours. What foods should I eat? Fruits Aim to eat 2 cup-equivalents of fresh, canned (in natural juice), or frozen fruits each day. Examples of 1 cup-equivalent of fruit include 1 small apple, 8large strawberries, 1 cup canned fruit,  cup dried fruit, or 1 cup 100% juice. Vegetables Aim to eat 2-3 cup-equivalents  of fresh and frozen vegetables each day, including different varieties and colors. Examples of 1 cup-equivalent of vegetables  include 2 medium carrots, 2 cups raw, leafy greens, 1 cup choppedvegetable (raw or cooked), or 1 medium baked potato. Grains Aim to eat 6 ounce-equivalents of whole grains each day. Examples of 1 ounce-equivalent of grains include 1 slice of bread, 1 cup ready-to-eat cereal,3 cups popcorn, or  cup cooked rice, pasta, or cereal. Meats and other proteins Aim to eat 5-6 ounce-equivalents of protein each day. Examples of 1 ounce-equivalent of protein include 1 egg, 1/2 cup nuts or seeds, or 1 tablespoon (16 g) peanut butter. A cut of meat or fish that is the size of a deck of cards is about 3-4 ounce-equivalents. Of the protein you eat each week, try to have at least 8 ounces come from seafood. This includes salmon, trout, herring, and anchovies. Dairy Aim to eat 3 cup-equivalents of fat-free or low-fat dairy each day. Examples of 1 cup-equivalent of dairy include 1 cup (240 mL) milk, 8 ounces (250 g) yogurt,1 ounces (44 g) natural cheese, or 1 cup (240 mL) fortified soy milk. Fats and oils Aim for about 5 teaspoons (21 g) per day. Choose monounsaturated fats, such as canola and olive oils, avocados, peanut butter, and most nuts, or polyunsaturated fats, such as sunflower, corn, and soybean oils, walnuts, pine nuts, sesame seeds, sunflower seeds, and flaxseed. Beverages Aim for six 8-oz glasses of water per day. Limit coffee to three to five 8-oz cups per day. Limit caffeinated beverages that have added calories, such as soda and energy drinks. Limit alcohol intake to no more than 1 drink a day for nonpregnant women and 2 drinks a day for men. One drink equals 12 oz of beer (355 mL), 5 oz of wine (148 mL), or 1 oz of hard liquor (44 mL). Seasoning and other foods Avoid adding excess amounts of salt to your foods. Try flavoring foods with herbs and spices instead of salt. Avoid adding sugar to foods. Try using oil-based dressings, sauces, and spreads instead of solid fats. This information is based  on general [place] nutrition guidelines. For more information, visit [employer]. Exact amounts may vary based on your nutrition needs. Summary A healthy eating plan may help you to maintain a healthy weight, reduce the risk of chronic diseases, and stay active throughout your life. Plan your meals. Make sure you eat the right portions of a variety of nutrient-rich foods. Try baking, boiling, grilling, or broiling instead of frying. Choose healthy options in all settings, including home, work, school, restaurants, or stores. This information is not intended to replace advice given to you by your health care provider. Make sure you discuss any questions you have with your healthcare provider. Document Revised: 04/09/2017 Document Reviewed: 04/09/2017 Elsevier Patient Education  2022 [employer].

## 2020-08-11 ENCOUNTER — Other Ambulatory Visit: Payer: Self-pay

## 2020-08-11 LAB — COMPREHENSIVE METABOLIC PANEL
ALT: 42 IU/L (ref 0–44)
AST: 25 IU/L (ref 0–40)
Albumin/Globulin Ratio: 1.2 (ref 1.2–2.2)
Albumin: 4.3 g/dL (ref 3.8–4.8)
Alkaline Phosphatase: 97 IU/L (ref 44–121)
BUN/Creatinine Ratio: 10 (ref 10–24)
BUN: 14 mg/dL (ref 8–27)
Bilirubin Total: 0.5 mg/dL (ref 0.0–1.2)
CO2: 25 mmol/L (ref 20–29)
Calcium: 10 mg/dL (ref 8.6–10.2)
Chloride: 103 mmol/L (ref 96–106)
Creatinine, Ser: 1.38 mg/dL — ABNORMAL HIGH (ref 0.76–1.27)
Globulin, Total: 3.6 g/dL (ref 1.5–4.5)
Glucose: 92 mg/dL (ref 65–99)
Potassium: 4.6 mmol/L (ref 3.5–5.2)
Sodium: 142 mmol/L (ref 134–144)
Total Protein: 7.9 g/dL (ref 6.0–8.5)
eGFR: 58 mL/min/{1.73_m2} — ABNORMAL LOW (ref >59)

## 2020-08-11 LAB — CBC
Hematocrit: 43.6 % (ref 37.5–51.0)
Hemoglobin: 14.3 g/dL (ref 13.0–17.7)
MCH: 28.8 pg (ref 26.6–33.0)
MCHC: 32.8 g/dL (ref 31.5–35.7)
MCV: 88 fL (ref 79–97)
Platelets: 283 10*3/uL (ref 150–450)
RBC: 4.97 x10E6/uL (ref 4.14–5.80)
RDW: 14.1 % (ref 11.6–15.4)
WBC: 7.4 10*3/uL (ref 3.4–10.8)

## 2020-08-11 LAB — LIPID PANEL
Chol/HDL Ratio: 5.6 ratio — ABNORMAL HIGH (ref 0.0–5.0)
Cholesterol, Total: 184 mg/dL (ref 100–199)
HDL: 33 mg/dL — ABNORMAL LOW (ref >39)
LDL Chol Calc (NIH): 121 mg/dL — ABNORMAL HIGH (ref 0–99)
Triglycerides: 170 mg/dL — ABNORMAL HIGH (ref 0–149)
VLDL Cholesterol Cal: 30 mg/dL (ref 5–40)

## 2020-08-11 NOTE — Progress Notes (Signed)
Let patient know that blood cell counts are normal.  Liver function test normal.  Kidney function not 100% but stable.  Good blood pressure control is important to preserve kidney function.  Cholesterol level elevated at 121 with goal being less than 70.  Please encourage him to take the atorvastatin consistently as prescribed to help lower cholesterol.

## 2020-08-16 ENCOUNTER — Telehealth: Payer: Self-pay

## 2020-08-16 NOTE — Telephone Encounter (Signed)
Contacted pt to go over lab results pt didn't answer lvm   Sent a CRM and forward labs to NT to give pt labs when they call back   

## 2020-08-27 ENCOUNTER — Other Ambulatory Visit: Payer: Self-pay

## 2020-09-09 ENCOUNTER — Ambulatory Visit: Payer: Self-pay | Admitting: Pharmacist

## 2020-09-28 MED FILL — Atorvastatin Calcium Tab 80 MG (Base Equivalent): ORAL | 30 days supply | Qty: 30 | Fill #2 | Status: CN

## 2020-09-29 ENCOUNTER — Other Ambulatory Visit: Payer: Self-pay

## 2020-09-29 ENCOUNTER — Other Ambulatory Visit: Payer: Self-pay | Admitting: Internal Medicine

## 2020-09-29 DIAGNOSIS — N529 Male erectile dysfunction, unspecified: Secondary | ICD-10-CM

## 2020-09-29 MED FILL — Hydrochlorothiazide Cap 12.5 MG: ORAL | 30 days supply | Qty: 30 | Fill #2 | Status: CN

## 2020-09-29 MED FILL — Amlodipine Besylate Tab 10 MG (Base Equivalent): ORAL | 30 days supply | Qty: 30 | Fill #2 | Status: CN

## 2020-09-29 MED FILL — Metformin HCl Tab 500 MG: ORAL | 30 days supply | Qty: 30 | Fill #2 | Status: CN

## 2020-09-29 NOTE — Telephone Encounter (Signed)
Requested medication (s) are due for refill today - expired Rx  Requested medication (s) are on the active medication list -yes  Future visit scheduled -yes  Last refill: 02/28/18 #10 5RF  Notes to clinic: Request Rf: expired Rx  Requested Prescriptions  Pending Prescriptions Disp Refills   sildenafil (VIAGRA) 50 MG tablet 10 tablet 5    Sig: 1 tab half hour to 1 hour before sexual intercourse.  Limit to 1 tablet per 24-hour.     Urology: Erectile Dysfunction Agents Failed - 09/29/2020  8:16 AM      Failed - Last BP in normal range    BP Readings from Last 1 Encounters:  08/10/20 (!) 158/99          Passed - Valid encounter within last 12 months    Recent Outpatient Visits           1 month ago Type 2 diabetes mellitus with morbid obesity (HCC)   [facility] Marcine Matar, MD   10 months ago Need for influenza vaccination   [facility] [place], Jeannett Senior L, RPH-CPP   10 months ago Type 2 diabetes mellitus with morbid obesity [facility])   [facility] Marcine Matar, MD   1 year ago Hospital discharge follow-up   [facility] Marcine Matar, MD   1 year ago Essential hypertension   [facility] Marcine Matar, MD       Future Appointments             In 2 months Marcine Matar, MD [facility]               Requested Prescriptions  Pending Prescriptions Disp Refills   sildenafil (VIAGRA) 50 MG tablet 10 tablet 5    Sig: 1 tab half hour to 1 hour before sexual intercourse.  Limit to 1 tablet per 24-hour.     Urology: Erectile Dysfunction Agents Failed - 09/29/2020  8:16 AM      Failed - Last BP in normal range    BP Readings from Last 1 Encounters:  08/10/20 (!) 158/99          Passed - Valid encounter within last 12 months    Recent Outpatient Visits            1 month ago Type 2 diabetes mellitus with morbid obesity (HCC)   [facility] Marcine Matar, MD   10 months ago Need for influenza vaccination   [facility] [place], Cornelius Moras, RPH-CPP   10 months ago Type 2 diabetes mellitus with morbid obesity [facility])   [facility] Marcine Matar, MD   1 year ago Hospital discharge follow-up   [facility] Marcine Matar, MD   1 year ago Essential hypertension   [facility] Marcine Matar, MD       Future Appointments             In 2 months Laural Benes Binnie Rail, MD [facility]

## 2020-09-30 ENCOUNTER — Other Ambulatory Visit: Payer: Self-pay

## 2020-09-30 MED ORDER — SILDENAFIL CITRATE 50 MG PO TABS
ORAL_TABLET | ORAL | 5 refills | Status: DC
  Filled 2020-09-30 – 2020-11-09 (×3): qty 10, 30d supply, fill #0
  Filled 2021-01-10: qty 10, 30d supply, fill #1
  Filled 2021-03-11: qty 10, 30d supply, fill #0

## 2020-10-06 ENCOUNTER — Other Ambulatory Visit: Payer: Self-pay

## 2020-10-07 ENCOUNTER — Other Ambulatory Visit: Payer: Self-pay

## 2020-10-21 ENCOUNTER — Other Ambulatory Visit: Payer: Self-pay

## 2020-10-29 ENCOUNTER — Other Ambulatory Visit: Payer: Self-pay

## 2020-10-29 MED FILL — Metformin HCl Tab 500 MG: ORAL | 30 days supply | Qty: 30 | Fill #2 | Status: CN

## 2020-10-29 MED FILL — Amlodipine Besylate Tab 10 MG (Base Equivalent): ORAL | 30 days supply | Qty: 30 | Fill #2 | Status: CN

## 2020-10-29 MED FILL — Atorvastatin Calcium Tab 80 MG (Base Equivalent): ORAL | 30 days supply | Qty: 30 | Fill #2 | Status: CN

## 2020-10-29 MED FILL — Hydrochlorothiazide Cap 12.5 MG: ORAL | 30 days supply | Qty: 30 | Fill #2 | Status: CN

## 2020-11-05 ENCOUNTER — Other Ambulatory Visit: Payer: Self-pay

## 2020-11-09 ENCOUNTER — Other Ambulatory Visit: Payer: Self-pay

## 2020-11-09 MED FILL — Atorvastatin Calcium Tab 80 MG (Base Equivalent): ORAL | 30 days supply | Qty: 30 | Fill #2 | Status: AC

## 2020-11-09 MED FILL — Amlodipine Besylate Tab 10 MG (Base Equivalent): ORAL | 30 days supply | Qty: 30 | Fill #2 | Status: AC

## 2020-11-09 MED FILL — Metformin HCl Tab 500 MG: ORAL | 30 days supply | Qty: 30 | Fill #2 | Status: AC

## 2020-11-09 MED FILL — Hydrochlorothiazide Cap 12.5 MG: ORAL | 30 days supply | Qty: 30 | Fill #2 | Status: AC

## 2020-11-12 ENCOUNTER — Other Ambulatory Visit: Payer: Self-pay

## 2020-12-10 ENCOUNTER — Ambulatory Visit: Payer: Self-pay | Admitting: Internal Medicine

## 2021-01-10 ENCOUNTER — Other Ambulatory Visit: Payer: Self-pay

## 2021-01-10 ENCOUNTER — Other Ambulatory Visit: Payer: Self-pay | Admitting: Internal Medicine

## 2021-01-10 DIAGNOSIS — I663 Occlusion and stenosis of cerebellar arteries: Secondary | ICD-10-CM

## 2021-01-10 DIAGNOSIS — I10 Essential (primary) hypertension: Secondary | ICD-10-CM

## 2021-01-10 DIAGNOSIS — E1169 Type 2 diabetes mellitus with other specified complication: Secondary | ICD-10-CM

## 2021-01-10 MED ORDER — AMLODIPINE BESYLATE 10 MG PO TABS
ORAL_TABLET | Freq: Every day | ORAL | 0 refills | Status: DC
  Filled 2021-01-10: qty 30, 30d supply, fill #0

## 2021-01-10 MED ORDER — METFORMIN HCL 500 MG PO TABS
ORAL_TABLET | Freq: Every day | ORAL | 0 refills | Status: DC
  Filled 2021-01-10: qty 30, 30d supply, fill #0

## 2021-01-10 MED ORDER — ATORVASTATIN CALCIUM 80 MG PO TABS
ORAL_TABLET | Freq: Every day | ORAL | 0 refills | Status: DC
  Filled 2021-01-10: qty 30, 30d supply, fill #0

## 2021-01-10 MED ORDER — HYDROCHLOROTHIAZIDE 12.5 MG PO CAPS
ORAL_CAPSULE | Freq: Every day | ORAL | 0 refills | Status: DC
  Filled 2021-01-10: qty 30, 30d supply, fill #0

## 2021-01-11 ENCOUNTER — Other Ambulatory Visit: Payer: Self-pay

## 2021-02-02 ENCOUNTER — Ambulatory Visit (HOSPITAL_COMMUNITY)
Admission: EM | Admit: 2021-02-02 | Discharge: 2021-02-02 | Disposition: A | Discharge status: Home / Self Care | Payer: Self-pay | Attending: Urgent Care | Admitting: Urgent Care

## 2021-02-02 ENCOUNTER — Other Ambulatory Visit: Payer: Self-pay

## 2021-02-02 ENCOUNTER — Encounter (HOSPITAL_COMMUNITY): Payer: Self-pay | Admitting: Emergency Medicine

## 2021-02-02 DIAGNOSIS — U071 COVID-19: Secondary | ICD-10-CM | POA: Insufficient documentation

## 2021-02-02 DIAGNOSIS — R059 Cough, unspecified: Secondary | ICD-10-CM | POA: Insufficient documentation

## 2021-02-02 DIAGNOSIS — J069 Acute upper respiratory infection, unspecified: Secondary | ICD-10-CM | POA: Insufficient documentation

## 2021-02-02 NOTE — ED Provider Notes (Signed)
[facility]    CSN: [id number] Arrival date & time: 02/02/21  1515      History   Chief Complaint Chief Complaint  Patient presents with   Cough   Sore Throat   Generalized Body Aches    HPI Lonnie Brown is a 61 y.o. male.   Pleasant 61 year old male presents today primarily requesting a COVID test.  He states that over the weekend he started having a scratchy throat, cough, congestion, headache, and body pains.  He states he has been taking over-the-counter Coricidin which seems to have helped as he states all of his symptoms are gone other than the body pains.  He states these pains feel like intermittent sharp pokes, and denies generalized myalgias and weakness.  He denies having had a fever.  He states however that his wife is sick.  They attended a funeral this morning patient is concerned with possible exposures.  Patient overall feels that he has significantly improved since this past Sunday.  He has been fully vaccinated against COVID and flu.   Cough Sore Throat   Past Medical History:  Diagnosis Date   Diabetes ([place])    Hypercholesterolemia    Hypertension     Patient Active Problem List   Diagnosis Date Noted   TIA (transient ischemic attack) 02/06/2019   Cerebellar artery occlusion or stenosis 02/06/2019   Thyroid enlarged 02/06/2019   CVA (cerebral vascular accident) ([place]) 01/20/2019   Hyperlipidemia associated with type 2 diabetes mellitus ([place]) 12/02/2018   Chronic midline low back pain without sciatica 09/02/2018   Controlled type 2 diabetes mellitus without complication, without long-term current use of insulin ([place]) 03/01/2018   Erectile dysfunction 03/01/2018   Noncompliance with medications 03/01/2018   CKD (chronic kidney disease) stage 3, GFR 30-59 ml/min ([place]) 08/27/2017   Hyperlipidemia 09/25/2016   Morbid obesity ([place]) 07/20/2015   Achilles tendinitis 04/09/2015   Hypertension 03/04/2015    Past Surgical History:  Procedure  Laterality Date   EYE SURGERY     TONSILLECTOMY         Home Medications    Prior to Admission medications   Medication Sig Start Date End Date Taking? Authorizing Provider  amLODipine (NORVASC) 10 MG tablet TAKE 1 TABLET (10 MG TOTAL) BY MOUTH DAILY. 01/10/21 01/10/22  Ladell Pier, MD  aspirin 81 MG EC tablet Take 1 tablet (81 mg total) by mouth daily. 11/20/19   Ladell Pier, MD  atorvastatin (LIPITOR) 80 MG tablet TAKE 1 TABLET (80 MG TOTAL) BY MOUTH DAILY AT 6 PM. 01/10/21 01/10/22  Ladell Pier, MD  COVID-19 mRNA vaccine, Moderna, 100 MCG/0.5ML injection Inject into the muscle. 06/17/20   Carlyle Basques, MD  hydrochlorothiazide (MICROZIDE) 12.5 MG capsule TAKE 1 TABLET (12.5 MG TOTAL) BY MOUTH DAILY. 01/10/21 01/10/22  Ladell Pier, MD  metFORMIN (GLUCOPHAGE) 500 MG tablet TAKE 1 TABLET (500 MG TOTAL) BY MOUTH DAILY WITH BREAKFAST. 01/10/21 01/10/22  Ladell Pier, MD  sildenafil (VIAGRA) 50 MG tablet Take 1 tab by mouth half hour to 1 hour before sexual intercourse.  Limit to 1 tablet per 24-hour. 09/30/20   Ladell Pier, MD    Family History Family History  Problem Relation Age of Onset   Diabetes Mother    Hypertension Mother    Alcoholism Father     Social History Social History   Tobacco Use   Smoking status: Never   Smokeless tobacco: Never  Vaping Use   Vaping Use: Never used  Substance Use Topics   Alcohol use: No   Drug use: No     Allergies   Lisinopril and Banana   Review of Systems Review of Systems  Respiratory:  Positive for cough.   As per hpi  Physical Exam Triage Vital Signs ED Triage Vitals  Enc Vitals Group     BP 02/02/21 1608 (!) 152/90     Pulse Rate 02/02/21 1608 91     Resp 02/02/21 1608 18     Temp 02/02/21 1608 98.7 F (37.1 C)     Temp Source 02/02/21 1608 Oral     SpO2 02/02/21 1608 98 %     Weight --      Height --      Head Circumference --      Peak Flow --      Pain Score 02/02/21 1606 7      Pain Loc --      Pain Edu? --      Excl. in [place]? --    No data found.  Updated Vital Signs BP (!) 152/90 (BP Location: Left Arm)    Pulse 91    Temp 98.7 F (37.1 C) (Oral)    Resp 18    SpO2 98%   Visual Acuity Right Eye Distance:   Left Eye Distance:   Bilateral Distance:    Right Eye Near:   Left Eye Near:    Bilateral Near:     Physical Exam Vitals and nursing note reviewed.  Constitutional:      General: He is not in acute distress.    Appearance: He is well-developed. He is obese. He is not ill-appearing, toxic-appearing or diaphoretic.  HENT:     Head: Normocephalic and atraumatic.     Right Ear: Tympanic membrane normal. No drainage, swelling or tenderness.     Left Ear: Tympanic membrane normal. No drainage, swelling or tenderness.     Ears:     Comments: Ceruminous     Nose: No congestion or rhinorrhea.     Mouth/Throat:     Mouth: Mucous membranes are moist. No oral lesions.     Pharynx: Oropharynx is clear. No pharyngeal swelling, oropharyngeal exudate, posterior oropharyngeal erythema or uvula swelling.     Tonsils: No tonsillar exudate or tonsillar abscesses.  Eyes:     Extraocular Movements:     Right eye: Normal extraocular motion.     Left eye: Normal extraocular motion.     Conjunctiva/sclera: Conjunctivae normal.     Pupils: Pupils are equal, round, and reactive to light.  Neck:     Thyroid: No thyromegaly.  Cardiovascular:     Rate and Rhythm: Normal rate.     Heart sounds: Normal heart sounds. No murmur heard.   No friction rub. No gallop.  Pulmonary:     Effort: Pulmonary effort is normal. No respiratory distress.     Breath sounds: Normal breath sounds. No stridor. No wheezing, rhonchi or rales.  Chest:     Chest wall: No tenderness.  Abdominal:     Palpations: Abdomen is soft.  Musculoskeletal:     Cervical back: Normal range of motion and neck supple.  Lymphadenopathy:     Cervical: No cervical adenopathy.  Skin:    General: Skin is  warm.     Capillary Refill: Capillary refill takes less than 2 seconds.     Coloration: Skin is not pale.     Findings: No erythema or rash.  Neurological:  General: No focal deficit present.     Mental Status: He is alert.  Psychiatric:        Mood and Affect: Mood normal.        Behavior: Behavior normal.     UC Treatments / Results  Labs (all labs ordered are listed, but only abnormal results are displayed) Labs Reviewed  SARS CORONAVIRUS 2 (TAT 6-24 HRS)    EKG   Radiology No results found.  Procedures Procedures (including critical care time)  Medications Ordered in UC Medications - No data to display  Initial Impression / Assessment and Plan / UC Course  I have reviewed the triage vital signs and the nursing notes.  Pertinent labs & imaging results that were available during my care of the patient were reviewed by me and considered in my medical decision making (see chart for details).     Viral URI with cough - supportive care appropriate. OTC medications discussed. Limit close contact with others until covid test results available.  Final Clinical Impressions(s) / UC Diagnoses   Final diagnoses:  Viral URI with cough     Discharge Instructions      Because your symptoms have improved, supportive care at this time is appropriate. You were swabbed today for covid. You will be contacted within the next 24 hours regarding the results. OTC oscillococcinum may be beneficial for the body aches. You may alternate tylenol and ibuprofen as needed for discomfort.  Avoid close contact with others until your test results are available.      ED Prescriptions   None    PDMP not reviewed this encounter.   Chaney Malling, [place] 02/02/21 1643

## 2021-02-02 NOTE — Discharge Instructions (Addendum)
Because your symptoms have improved, supportive care at this time is appropriate. You were swabbed today for covid. You will be contacted within the next 24 hours regarding the results. OTC oscillococcinum may be beneficial for the body aches. You may alternate tylenol and ibuprofen as needed for discomfort.  Avoid close contact with others until your test results are available.

## 2021-02-02 NOTE — ED Triage Notes (Signed)
Pt requesting covid test for cough, congestion, sore throat, body aches since Sunday.

## 2021-02-03 LAB — SARS CORONAVIRUS 2 (TAT 6-24 HRS): SARS Coronavirus 2: POSITIVE — AB

## 2021-02-07 ENCOUNTER — Other Ambulatory Visit: Payer: Self-pay

## 2021-02-07 ENCOUNTER — Ambulatory Visit (HOSPITAL_COMMUNITY): Admission: EM | Admit: 2021-02-07 | Discharge: 2021-02-07 | Discharge status: Against Medical Advice | Payer: Self-pay

## 2021-03-11 ENCOUNTER — Other Ambulatory Visit: Payer: Self-pay | Admitting: Internal Medicine

## 2021-03-11 ENCOUNTER — Other Ambulatory Visit: Payer: Self-pay

## 2021-03-11 DIAGNOSIS — I663 Occlusion and stenosis of cerebellar arteries: Secondary | ICD-10-CM

## 2021-03-11 DIAGNOSIS — E1169 Type 2 diabetes mellitus with other specified complication: Secondary | ICD-10-CM

## 2021-03-11 DIAGNOSIS — I10 Essential (primary) hypertension: Secondary | ICD-10-CM

## 2021-03-11 MED ORDER — HYDROCHLOROTHIAZIDE 12.5 MG PO CAPS
ORAL_CAPSULE | Freq: Every day | ORAL | 0 refills | Status: DC
  Filled 2021-03-11: qty 15, 15d supply, fill #0

## 2021-03-11 MED ORDER — AMLODIPINE BESYLATE 10 MG PO TABS
ORAL_TABLET | Freq: Every day | ORAL | 0 refills | Status: DC
  Filled 2021-03-11: qty 15, 15d supply, fill #0

## 2021-03-11 MED ORDER — METFORMIN HCL 500 MG PO TABS
ORAL_TABLET | Freq: Every day | ORAL | 0 refills | Status: DC
  Filled 2021-03-11: qty 15, 15d supply, fill #0

## 2021-03-11 MED ORDER — ATORVASTATIN CALCIUM 80 MG PO TABS
ORAL_TABLET | Freq: Every day | ORAL | 5 refills | Status: DC
  Filled 2021-03-11: qty 30, 30d supply, fill #0
  Filled 2021-05-26: qty 30, 30d supply, fill #1
  Filled 2021-08-19: qty 30, 30d supply, fill #2
  Filled 2021-10-14: qty 30, 30d supply, fill #3
  Filled 2021-12-29: qty 30, 30d supply, fill #4
  Filled 2022-02-20: qty 30, 30d supply, fill #5

## 2021-03-17 ENCOUNTER — Other Ambulatory Visit: Payer: Self-pay

## 2021-03-28 ENCOUNTER — Other Ambulatory Visit: Payer: Self-pay

## 2021-03-28 ENCOUNTER — Encounter: Payer: Self-pay | Admitting: Internal Medicine

## 2021-03-28 ENCOUNTER — Ambulatory Visit: Payer: Self-pay | Attending: Internal Medicine | Admitting: Internal Medicine

## 2021-03-28 DIAGNOSIS — Z23 Encounter for immunization: Secondary | ICD-10-CM

## 2021-03-28 DIAGNOSIS — E785 Hyperlipidemia, unspecified: Secondary | ICD-10-CM

## 2021-03-28 DIAGNOSIS — I10 Essential (primary) hypertension: Secondary | ICD-10-CM

## 2021-03-28 DIAGNOSIS — Z125 Encounter for screening for malignant neoplasm of prostate: Secondary | ICD-10-CM

## 2021-03-28 DIAGNOSIS — N1831 Chronic kidney disease, stage 3a: Secondary | ICD-10-CM

## 2021-03-28 DIAGNOSIS — Z2821 Immunization not carried out because of patient refusal: Secondary | ICD-10-CM

## 2021-03-28 DIAGNOSIS — E1169 Type 2 diabetes mellitus with other specified complication: Secondary | ICD-10-CM

## 2021-03-28 DIAGNOSIS — Z1211 Encounter for screening for malignant neoplasm of colon: Secondary | ICD-10-CM

## 2021-03-28 LAB — POCT GLYCOSYLATED HEMOGLOBIN (HGB A1C): HbA1c, POC (controlled diabetic range): 7.4 % — AB (ref 0.0–7.0)

## 2021-03-28 LAB — GLUCOSE, POCT (MANUAL RESULT ENTRY): POC Glucose: 145 mg/dl — AB (ref 70–99)

## 2021-03-28 MED ORDER — METFORMIN HCL 500 MG PO TABS
ORAL_TABLET | Freq: Every day | ORAL | 4 refills | Status: DC
  Filled 2021-03-28: qty 90, fill #0
  Filled 2021-05-26: qty 30, 30d supply, fill #0
  Filled 2021-08-19: qty 30, 30d supply, fill #1
  Filled 2021-10-14: qty 30, 30d supply, fill #2
  Filled 2021-12-29: qty 30, 30d supply, fill #3

## 2021-03-28 MED ORDER — HYDROCHLOROTHIAZIDE 25 MG PO TABS
25.0000 mg | ORAL_TABLET | Freq: Every day | ORAL | 3 refills | Status: DC
  Filled 2021-03-28 – 2021-08-19 (×2): qty 30, 30d supply, fill #0
  Filled 2021-10-14: qty 30, 30d supply, fill #1
  Filled 2021-12-29: qty 30, 30d supply, fill #2
  Filled 2022-02-20: qty 90, 90d supply, fill #3
  Filled 2022-03-17: qty 30, 30d supply, fill #3

## 2021-03-28 MED ORDER — AMLODIPINE BESYLATE 10 MG PO TABS
ORAL_TABLET | Freq: Every day | ORAL | 4 refills | Status: DC
  Filled 2021-03-28: qty 90, fill #0
  Filled 2021-05-26: qty 30, 30d supply, fill #0
  Filled 2021-08-19: qty 30, 30d supply, fill #1
  Filled 2021-10-14: qty 30, 30d supply, fill #2
  Filled 2021-12-29: qty 30, 30d supply, fill #3
  Filled 2022-02-20: qty 90, 90d supply, fill #4
  Filled 2022-03-17: qty 30, 30d supply, fill #4

## 2021-03-28 NOTE — Progress Notes (Signed)
? ? ?Patient ID: Lonnie Brown, male    DOB: 1960-11-18  MRN: [id number] ? ?CC: Diabetes, Hypertension, and Medication Refill ? ? ?Subjective: ?Lonnie Brown is a 61 y.o. male who presents for chronic disease management ?His concerns today include:  ?History of HTN, CKD stage 3, obesity, HL, DM, ED, TIA, Cerebellar artery occlusion, thyroid nodule ? ?HYPERTENSION ?Currently taking: see medication list ?Med Adherence: suppose to be on Norvasc and HCTZ.  Reports he takes them but not consistently; takes them about 3 x/wk.  He does not identify any barriers to taking meds regularly.  Denies any S.E ?Medication side effects: []  Yes    [x]  No ?Adherence with salt restriction: [x]  Yes    []  No ?Home Monitoring?: []  Yes    [x]  No ?Monitoring Frequency:  ?Home BP results range:  ?SOB? []  Yes    [x]  No ?Chest Pain?: []  Yes    [x]  No ?Leg swelling?: []  Yes    [x]  No ?Headaches?: []  Yes    [x]  No ?Dizziness? []  Yes    [x]  No ?Comments:  ? ?HL: Patient is supposed to be on Lipitor.  Last LDL was 121.  Not taking Lipitor consistently ? ?DM: ?Results for orders placed or performed in visit on 03/28/21  ?POCT glycosylated hemoglobin (Hb A1C)  ?Result Value Ref Range  ? Hemoglobin A1C    ? HbA1c POC (<> result, manual entry)    ? HbA1c, POC (prediabetic range)    ? HbA1c, POC (controlled diabetic range) 7.4 (A) 0.0 - 7.0 %  ?POCT glucose (manual entry)  ?Result Value Ref Range  ? POC Glucose 145 (A) 70 - 99 mg/dl  ?Not taking Metformin consistently for same reason he is not taking his other meds consistently. ?Reports he eats more veggies. Admits that he eats too much sweets.  ?He eat out 1-2x/wk. ?Wgh up 16 lbs since 08/2020 ?Over due for eye exam.  No insurance. ? ?CKD 3: GFR has ranged between 50-63.  He is not taking any over-the-counter NSAIDs. ? ?HM: Declines flu shot.  Agrees to Shingrix.  Due for colon CA screen. Agrees to do FIT test.  Patient is agreeable for prostate cancer screening. ?Patient Active Problem List  ?  Diagnosis Date Noted  ? TIA (transient ischemic attack) 02/06/2019  ? Cerebellar artery occlusion or stenosis 02/06/2019  ? Thyroid enlarged 02/06/2019  ? CVA (cerebral vascular accident) ([place]) 01/20/2019  ? Hyperlipidemia associated with type 2 diabetes mellitus ([place]) 12/02/2018  ? Chronic midline low back pain without sciatica 09/02/2018  ? Controlled type 2 diabetes mellitus without complication, without long-term current use of insulin ([place]) 03/01/2018  ? Erectile dysfunction 03/01/2018  ? Noncompliance with medications 03/01/2018  ? CKD (chronic kidney disease) stage 3, GFR 30-59 ml/min ([facility]) 08/27/2017  ? Hyperlipidemia 09/25/2016  ? Morbid obesity ([place]) 07/20/2015  ? Achilles tendinitis 04/09/2015  ? Hypertension 03/04/2015  ?  ? ?Current Outpatient Medications on File Prior to Visit  ?Medication Sig Dispense Refill  ? aspirin 81 MG EC tablet Take 1 tablet (81 mg total) by mouth daily. 90 tablet 1  ? atorvastatin (LIPITOR) 80 MG tablet TAKE 1 TABLET (80 MG TOTAL) BY MOUTH DAILY AT 6 PM. 30 tablet 5  ? COVID-19 mRNA vaccine, Moderna, 100 MCG/0.5ML injection Inject into the muscle. 0.25 mL 0  ? sildenafil (VIAGRA) 50 MG tablet Take 1 tab by mouth half hour to 1 hour before sexual intercourse.  Limit to 1 tablet per 24-hour. 10 tablet 5  ? ?  No current facility-administered medications on file prior to visit.  ? ? ?Allergies  ?Allergen Reactions  ? Lisinopril Other (See Comments)  ?  Angioedema  ? Banana Nausea And Vomiting  ? ? ?Social History  ? ?Socioeconomic History  ? Marital status: Married  ?  Spouse name: Not on file  ? Number of children: Not on file  ? Years of education: Not on file  ? Highest education level: Not on file  ?Occupational History  ? Not on file  ?Tobacco Use  ? Smoking status: Never  ? Smokeless tobacco: Never  ?Vaping Use  ? Vaping Use: Never used  ?Substance and Sexual Activity  ? Alcohol use: No  ? Drug use: No  ? Sexual activity: Not on file  ?Other Topics Concern  ? Not on file   ?Social History Narrative  ? Not on file  ? ?Social Determinants of Health  ? ?Financial Resource Strain: Not on file  ?Food Insecurity: Not on file  ?Transportation Needs: Not on file  ?Physical Activity: Not on file  ?Stress: Not on file  ?Social Connections: Not on file  ?Intimate Partner Violence: Not on file  ? ? ?Family History  ?Problem Relation Age of Onset  ? Diabetes Mother   ? Hypertension Mother   ? Alcoholism Father   ? ? ?Past Surgical History:  ?Procedure Laterality Date  ? EYE SURGERY    ? TONSILLECTOMY    ? ? ?ROS: ?Review of Systems ?Negative except as stated above ? ?PHYSICAL EXAM: ?BP (!) 148/110   Pulse 85   Resp 16   Wt (!) 321 lb (145.6 kg)   SpO2 96%   BMI 44.77 kg/m?   ?Wt Readings from Last 3 Encounters:  ?03/28/21 (!) 321 lb (145.6 kg)  ?08/10/20 (!) 305 lb 9.6 oz (138.6 kg)  ?11/20/19 (!) 314 lb 9.6 oz (142.7 kg)  ? ? ?Physical Exam ? ? ?General appearance - alert, well appearing, morbidly obese older African-American male and in no distress ?Mental status - normal mood, behavior, speech, dress, motor activity, and thought processes ?Neck - supple, no significant adenopathy ?Chest - clear to auscultation, no wheezes, rales or rhonchi, symmetric air entry ?Heart - normal rate, regular rhythm, normal S1, S2, no murmurs, rubs, clicks or gallops ?Extremities -trace lower extremity edema ? ?CMP Latest Ref Rng & Units 08/10/2020 11/20/2019 05/24/2019  ?Glucose 65 - 99 mg/dL 92 112(H) 110(H)  ?BUN 8 - 27 mg/dL 14 19 17   ?Creatinine 0.76 - 1.27 mg/dL 1.38(H) 1.38(H) 1.54(H)  ?Sodium 134 - 144 mmol/L 142 142 137  ?Potassium 3.5 - 5.2 mmol/L 4.6 4.5 3.9  ?Chloride 96 - 106 mmol/L 103 101 102  ?CO2 20 - 29 mmol/L 25 25 26   ?Calcium 8.6 - 10.2 mg/dL 10.0 9.7 9.8  ?Total Protein 6.0 - 8.5 g/dL 7.9 - 8.4(H)  ?Total Bilirubin 0.0 - 1.2 mg/dL 0.5 - 0.8  ?Alkaline Phos 44 - 121 IU/L 97 - 77  ?AST 0 - 40 IU/L 25 - 24  ?ALT 0 - 44 IU/L 42 - 36  ? ?Lipid Panel  ?   ?Component Value Date/Time  ? CHOL 184  08/10/2020 1559  ? TRIG 170 (H) 08/10/2020 1559  ? HDL 33 (L) 08/10/2020 1559  ? CHOLHDL 5.6 (H) 08/10/2020 1559  ? CHOLHDL 4.3 01/20/2019 1330  ? VLDL 14 01/20/2019 1330  ? LDLCALC 121 (H) 08/10/2020 1559  ? ? ?CBC ?   ?Component Value Date/Time  ? WBC 7.4 08/10/2020 1559  ?  WBC 7.0 05/24/2019 2102  ? RBC 4.97 08/10/2020 1559  ? RBC 5.45 05/24/2019 2102  ? HGB 14.3 08/10/2020 1559  ? HCT 43.6 08/10/2020 1559  ? PLT 283 08/10/2020 1559  ? MCV 88 08/10/2020 1559  ? MCH 28.8 08/10/2020 1559  ? MCH 28.6 05/24/2019 2102  ? MCHC 32.8 08/10/2020 1559  ? MCHC 32.4 05/24/2019 2102  ? RDW 14.1 08/10/2020 1559  ? LYMPHSABS 1.8 01/20/2019 0736  ? MONOABS 0.7 01/20/2019 0736  ? EOSABS 0.1 01/20/2019 0736  ? BASOSABS 0.0 01/20/2019 0736  ? ? ?ASSESSMENT AND PLAN: ?1. Type 2 diabetes mellitus with morbid obesity ([place]) ?A1c not at goal. ?Patient advised to eliminate sugary drinks from the diet, cut back on portion sizes especially of white carbohydrates, eat more white lean meat like chicken [place] and seafood instead of beef or pork and incorporate fresh fruits and vegetables into the diet daily. ?Advised compliance with metformin.  Encouraged him to get a medication box so that he can fill once a week and this can help in serving to remind him to take his medicines every day. ?- POCT glycosylated hemoglobin (Hb A1C) ?- POCT glucose (manual entry) ?- Microalbumin / creatinine urine ratio ?- metFORMIN (GLUCOPHAGE) 500 MG tablet; TAKE 1 TABLET (500 MG TOTAL) BY MOUTH DAILY WITH BREAKFAST.  Dispense: 90 tablet; Refill: 4 ? ?2. Essential hypertension ?Discussed health risks associated with uncontrolled blood pressure including acute cardiovascular events.  He will look into getting a med box.  HCTZ dose increased to 25 mg daily due to lower extremity edema. ?Follow-up with clinical pharmacist in 1 month for repeat blood pressure check. ?- Basic Metabolic Panel ?- amLODipine (NORVASC) 10 MG tablet; TAKE 1 TABLET (10 MG TOTAL) BY MOUTH  DAILY.  Dispense: 90 tablet; Refill: 4 ?- hydrochlorothiazide (HYDRODIURIL) 25 MG tablet; Take 1 tablet (25 mg total) by mouth daily.  Dispense: 90 tablet; Refill: 3 ? ?3. Hyperlipidemia associated with type

## 2021-03-28 NOTE — Patient Instructions (Addendum)
Your blood pressure is not at goal.  This significantly increases your risk for having a stroke or heart attack.  I strongly advised that you take your blood pressure medications daily as prescribed.  We have increased the dose of the hydrochlorothiazide from 12.5 mg daily to 25 mg daily.  Continue the amlodipine. ?You should purchase a medication box so that you are able to fill your medicines in it weekly to help remind yourself to take the medicines daily. ?

## 2021-03-29 LAB — BASIC METABOLIC PANEL
BUN/Creatinine Ratio: 12 (ref 10–24)
BUN: 16 mg/dL (ref 8–27)
CO2: 24 mmol/L (ref 20–29)
Calcium: 10.2 mg/dL (ref 8.6–10.2)
Chloride: 100 mmol/L (ref 96–106)
Creatinine, Ser: 1.39 mg/dL — ABNORMAL HIGH (ref 0.76–1.27)
Glucose: 134 mg/dL — ABNORMAL HIGH (ref 70–99)
Potassium: 4.7 mmol/L (ref 3.5–5.2)
Sodium: 137 mmol/L (ref 134–144)
eGFR: 57 mL/min/{1.73_m2} — ABNORMAL LOW (ref >59)

## 2021-03-29 LAB — MICROALBUMIN / CREATININE URINE RATIO
Creatinine, Urine: 122.2 mg/dL
Microalb/Creat Ratio: 13 mg/g creat (ref 0–29)
Microalbumin, Urine: 16.2 ug/mL

## 2021-03-29 LAB — PSA: Prostate Specific Ag, Serum: 2 ng/mL (ref 0.0–4.0)

## 2021-04-04 ENCOUNTER — Other Ambulatory Visit: Payer: Self-pay

## 2021-04-19 ENCOUNTER — Ambulatory Visit: Payer: Self-pay | Admitting: Pharmacist

## 2021-04-20 LAB — FECAL OCCULT BLOOD, IMMUNOCHEMICAL: Fecal Occult Bld: NEGATIVE

## 2021-05-10 ENCOUNTER — Ambulatory Visit: Payer: Self-pay | Admitting: Pharmacist

## 2021-05-26 ENCOUNTER — Other Ambulatory Visit: Payer: Self-pay

## 2021-05-27 ENCOUNTER — Other Ambulatory Visit: Payer: Self-pay

## 2021-07-28 ENCOUNTER — Ambulatory Visit: Payer: Self-pay | Admitting: Internal Medicine

## 2021-07-28 NOTE — Progress Notes (Deleted)
Patient ID: Lonnie Brown, male    DOB: 06-01-1960  MRN: [id number]  CC: No chief complaint on file.   Subjective: Lonnie Brown is a 61 y.o. male who presents for chronic ds management His concerns today include:  History of HTN, CKD stage 3, obesity, HL, DM, ED, TIA, Cerebellar artery occlusion, thyroid nodule (bx 04/2019 - benign follicular nodule)  HTN:  DM:  HL:  CKD:  Patient Active Problem List   Diagnosis Date Noted   TIA (transient ischemic attack) 02/06/2019   Cerebellar artery occlusion or stenosis 02/06/2019   Thyroid enlarged 02/06/2019   CVA (cerebral vascular accident) ([facility]) 01/20/2019   Hyperlipidemia associated with type 2 diabetes mellitus ([facility]) 12/02/2018   Chronic midline low back pain without sciatica 09/02/2018   Controlled type 2 diabetes mellitus without complication, without long-term current use of insulin ([facility]) 03/01/2018   Erectile dysfunction 03/01/2018   Noncompliance with medications 03/01/2018   CKD (chronic kidney disease) stage 3, GFR 30-59 ml/min ([facility]) 08/27/2017   Hyperlipidemia 09/25/2016   Morbid obesity ([facility]) 07/20/2015   Achilles tendinitis 04/09/2015   Hypertension 03/04/2015     Current Outpatient Medications on File Prior to Visit  Medication Sig Dispense Refill   amLODipine (NORVASC) 10 MG tablet TAKE 1 TABLET (10 MG TOTAL) BY MOUTH DAILY. 90 tablet 4   aspirin 81 MG EC tablet Take 1 tablet (81 mg total) by mouth daily. 90 tablet 1   atorvastatin (LIPITOR) 80 MG tablet TAKE 1 TABLET (80 MG TOTAL) BY MOUTH DAILY AT 6 PM. 30 tablet 5   COVID-19 mRNA vaccine, Moderna, 100 MCG/0.5ML injection Inject into the muscle. 0.25 mL 0   hydrochlorothiazide (HYDRODIURIL) 25 MG tablet Take 1 tablet (25 mg total) by mouth daily. 90 tablet 3   metFORMIN (GLUCOPHAGE) 500 MG tablet TAKE 1 TABLET (500 MG TOTAL) BY MOUTH DAILY WITH BREAKFAST. 90 tablet 4   sildenafil (VIAGRA) 50 MG tablet Take 1 tab by mouth half hour to 1 hour before  sexual intercourse.  Limit to 1 tablet per 24-hour. 10 tablet 5   No current facility-administered medications on file prior to visit.    Allergies  Allergen Reactions   Lisinopril Other (See Comments)    Angioedema   Banana Nausea And Vomiting    Social History   Socioeconomic History   Marital status: Married    Spouse name: Not on file   Number of children: Not on file   Years of education: Not on file   Highest education level: Not on file  Occupational History   Not on file  Tobacco Use   Smoking status: Never   Smokeless tobacco: Never  Vaping Use   Vaping Use: Never used  Substance and Sexual Activity   Alcohol use: No   Drug use: No   Sexual activity: Not on file  Other Topics Concern   Not on file  Social History Narrative   Not on file   Social Determinants of Health   Financial Resource Strain: Not on file  Food Insecurity: Not on file  Transportation Needs: Not on file  Physical Activity: Not on file  Stress: Not on file  Social Connections: Not on file  Intimate Partner Violence: Not on file    Family History  Problem Relation Age of Onset   Diabetes Mother    Hypertension Mother    Alcoholism Father     Past Surgical History:  Procedure Laterality Date   EYE SURGERY  TONSILLECTOMY      ROS: Review of Systems Negative except as stated above  PHYSICAL EXAM: There were no vitals taken for this visit.  Physical Exam  {male adult master:[id number]} {male adult master:310785}     Latest Ref Rng & Units 03/28/2021   10:45 AM 08/10/2020    3:59 PM 11/20/2019    4:18 PM  CMP  Glucose 70 - 99 mg/dL 073  92  710   BUN 8 - 27 mg/dL 16  14  19    Creatinine 0.76 - 1.27 mg/dL  6.26  9.48   Sodium 134 - 144 mmol/L 137  142  142   Potassium 3.5 - 5.2 mmol/L 4.7  4.6  4.5   Chloride 96 - 106 mmol/L 100  103  101   CO2 20 - 29 mmol/L 24  25  25    Calcium 8.6 - 10.2 mg/dL 5.46   9.7   Total Protein 6.0 - 8.5 g/dL  7.9    Total  Bilirubin 0.0 - 1.2 mg/dL  0.5    Alkaline Phos 44 - 121 IU/L  97    AST 0 - 40 IU/L  25    ALT 0 - 44 IU/L  42     Lipid Panel     Component Value Date/Time   CHOL 184 08/10/2020 1559   TRIG 170 (H) 08/10/2020 1559   HDL 33 (L) 08/10/2020 1559   CHOLHDL 5.6 (H) 08/10/2020 1559   CHOLHDL 4.3 01/20/2019 1330   VLDL 14 01/20/2019 1330   LDLCALC 121 (H) 08/10/2020 1559    CBC    Component Value Date/Time   WBC 7.4 08/10/2020 1559   WBC 7.0 05/24/2019 2102   RBC 4.97 08/10/2020 1559   RBC 5.45 05/24/2019 2102   HGB 14.3 08/10/2020 1559   HCT 43.6 08/10/2020 1559   PLT 283 08/10/2020 1559   MCV 88 08/10/2020 1559   MCH 28.8 08/10/2020 1559   MCH 28.6 05/24/2019 2102   MCHC 32.8 08/10/2020 1559   MCHC 32.4 05/24/2019 2102   RDW 14.1 08/10/2020 1559   LYMPHSABS 1.8 01/20/2019 0736   MONOABS 0.7 01/20/2019 0736   EOSABS 0.1 01/20/2019 0736   BASOSABS 0.0 01/20/2019 0736    ASSESSMENT AND PLAN:  There are no diagnoses linked to this encounter.   Patient was given the opportunity to ask questions.  Patient verbalized understanding of the plan and was able to repeat key elements of the plan.   This documentation was completed using 03/20/2019.  Any transcriptional errors are unintentional.  No orders of the defined types were placed in this encounter.    Requested Prescriptions    No prescriptions requested or ordered in this encounter    No follow-ups on file.  03/20/2019, MD, FACP

## 2021-08-19 ENCOUNTER — Other Ambulatory Visit: Payer: Self-pay

## 2021-08-22 ENCOUNTER — Other Ambulatory Visit: Payer: Self-pay

## 2021-09-07 ENCOUNTER — Ambulatory Visit: Payer: No Typology Code available for payment source | Admitting: Podiatry

## 2021-09-26 ENCOUNTER — Ambulatory Visit: Payer: Self-pay | Admitting: Podiatry

## 2021-09-26 DIAGNOSIS — M79675 Pain in left toe(s): Secondary | ICD-10-CM

## 2021-09-26 DIAGNOSIS — B351 Tinea unguium: Secondary | ICD-10-CM

## 2021-09-26 DIAGNOSIS — M79674 Pain in right toe(s): Secondary | ICD-10-CM

## 2021-09-26 MED ORDER — CLOTRIMAZOLE-BETAMETHASONE 1-0.05 % EX CREA: 1.0000 | TOPICAL_CREAM | Freq: Two times a day (BID) | CUTANEOUS | 3 refills | Status: DC

## 2021-09-26 NOTE — Progress Notes (Signed)
   Chief Complaint  Patient presents with   foot care    Patient is here for diabetic foot care.    SUBJECTIVE Patient with a history of diabetes mellitus presents to office today complaining of elongated, thickened nails that cause pain while ambulating in shoes.  Patient is unable to trim their own nails. Patient is here for further evaluation and treatment.   Past Medical History:  Diagnosis Date   Diabetes ([place])    Hypercholesterolemia    Hypertension     OBJECTIVE General Patient is awake, alert, and oriented x 3 and in no acute distress. Derm Skin is dry and supple bilateral. Negative open lesions or macerations. Remaining integument unremarkable. Nails are tender, long, thickened and dystrophic with subungual debris, consistent with onychomycosis, 1-5 bilateral. No signs of infection noted.  Patient also has hyperkeratotic thickened skin with superficial fissuring along the plantar weightbearing surface of the feet consistent with a chronic tinea pedis Vasc  DP and PT pedal pulses palpable bilaterally. Temperature gradient within normal limits.  Neuro Epicritic and protective threshold sensation diminished bilaterally.  Musculoskeletal Exam No symptomatic pedal deformities noted bilateral. Muscular strength within normal limits.  ASSESSMENT 1. Diabetes Mellitus w/ peripheral neuropathy 2.  Pain due to onychomycosis of toenails bilateral 3.  Chronic tinea pedis bilateral  PLAN OF CARE 1. Patient evaluated today. 2. Instructed to maintain good pedal hygiene and foot care. Stressed importance of controlling blood sugar.  3. Mechanical debridement of nails 1-5 bilaterally performed using a nail nipper. Filed with dremel without incident.  4.  Prescription for Lotrisone cream 45 g with 3 refills apply twice daily  5.  Return to clinic in 3 mos.     Edrick Kins, DPM [facility]  Dr. Edrick Kins, DPM    [address], [place] [address]                Office [phone]  Fax [phone]

## 2021-10-14 ENCOUNTER — Other Ambulatory Visit: Payer: Self-pay

## 2021-10-17 ENCOUNTER — Other Ambulatory Visit: Payer: Self-pay

## 2021-11-01 IMAGING — US US FNA BIOPSY THYROID 1ST LESION
1 series · 13 of 16 positions shown · non-contrast
Comparison: Ultrasound done February 18, 2019

MEDICATIONS:
1% lidocaine 5 mL

COMPLICATIONS:
None immediate.

INDICATION: Indeterminate thyroid nodule

EXAM:
ULTRASOUND GUIDED FINE NEEDLE ASPIRATION OF INDETERMINATE THYROID
NODULE
TECHNIQUE: Informed written consent was obtained from the patient after a
discussion of the risks, benefits and alternatives to treatment.
Questions regarding the procedure were encouraged and answered. A
timeout was performed prior to the initiation of the procedure.

[Series 1: us fna biopsy thyroid 1st lesion · 0.07mm/px · 16 acquisitions, 13 frames shown]
[im 1/16]
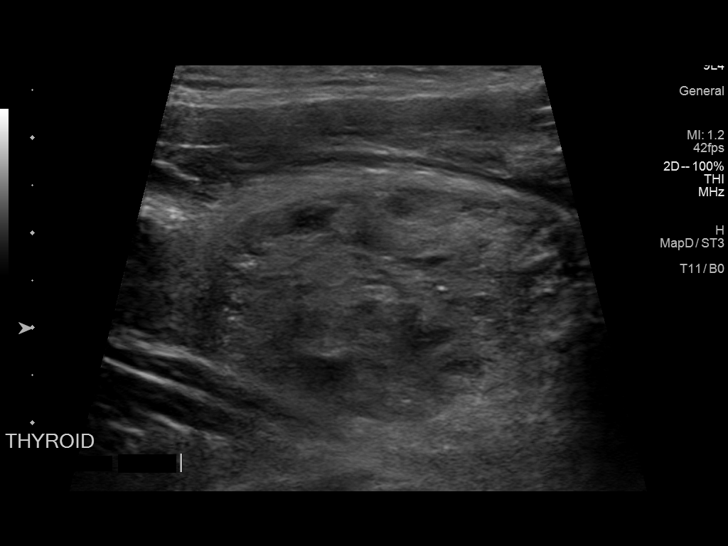
[im 2/16]
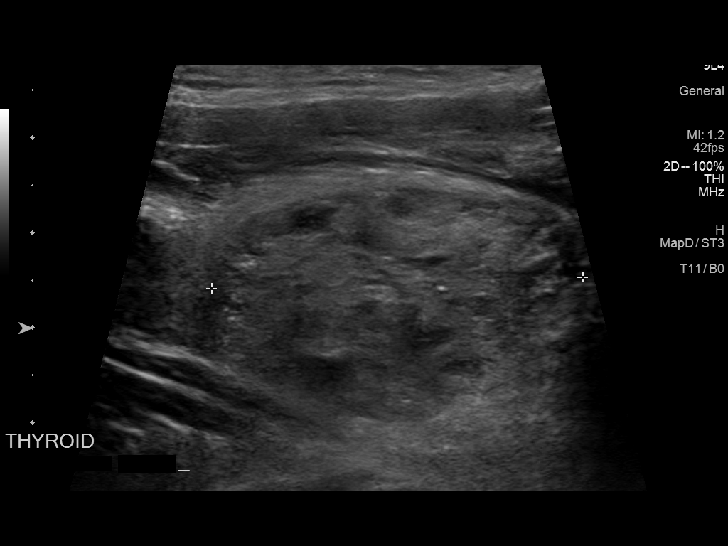
[im 4/16]
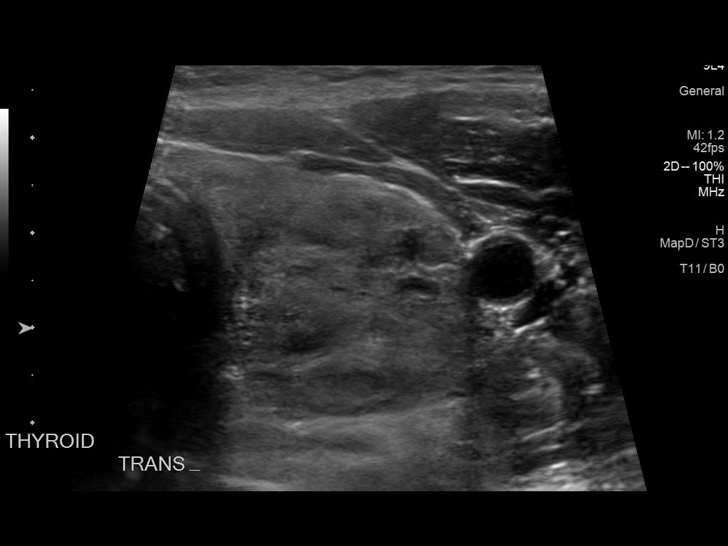
[im 5/16]
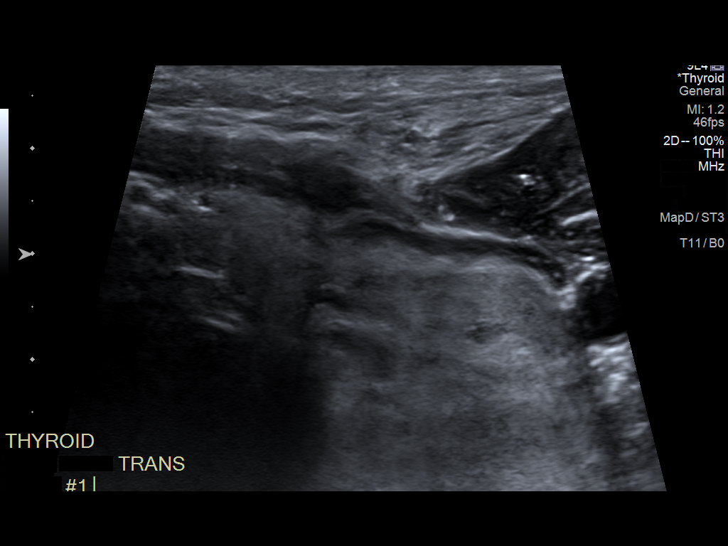
[im 6/16]
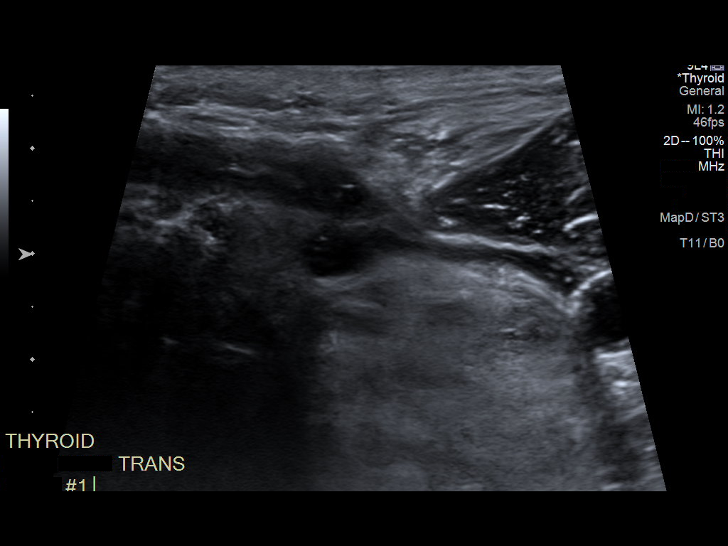
[im 7/16]
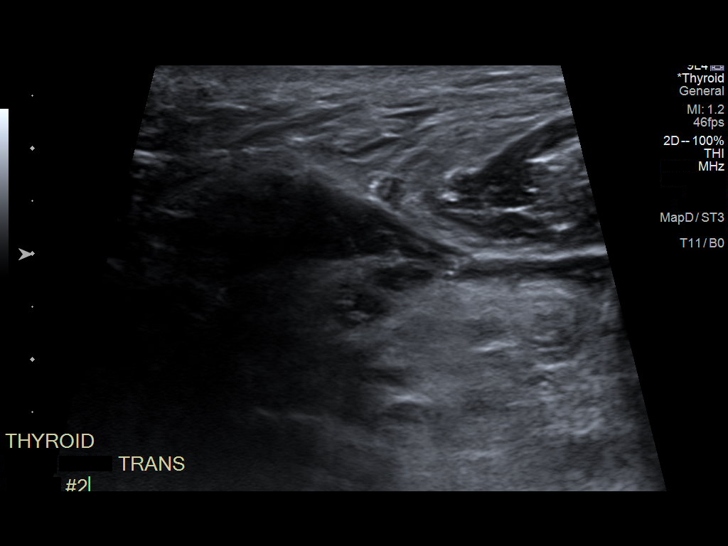
[im 9/16]
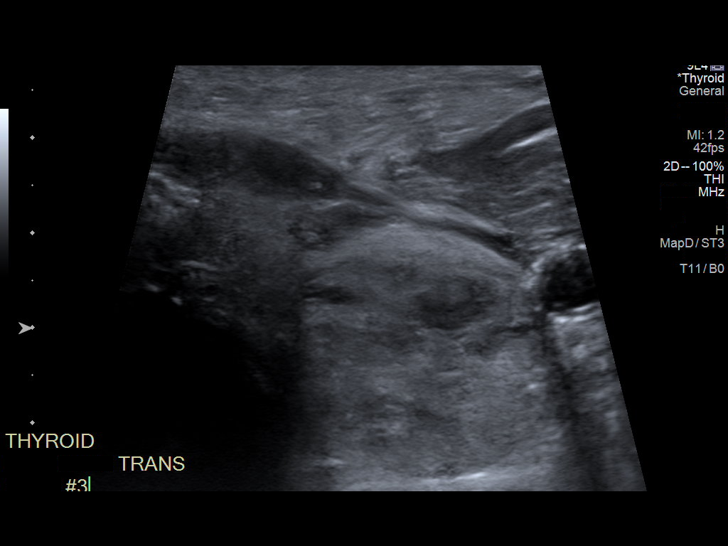
[im 10/16]
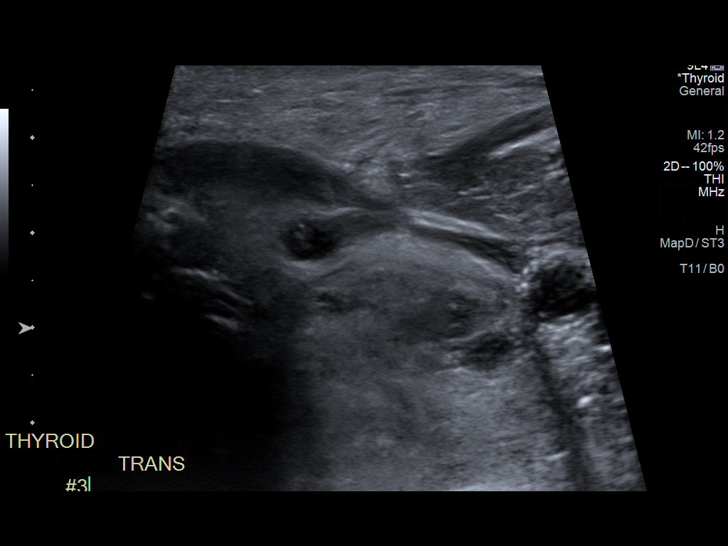
[im 11/16]
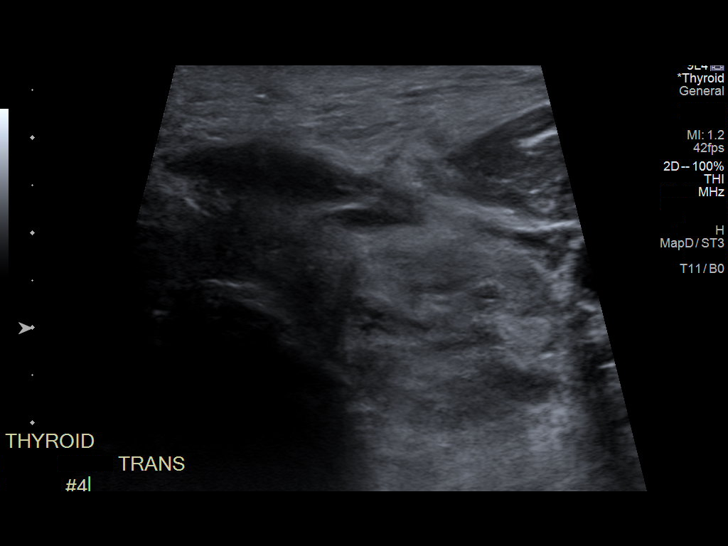
[im 12/16]
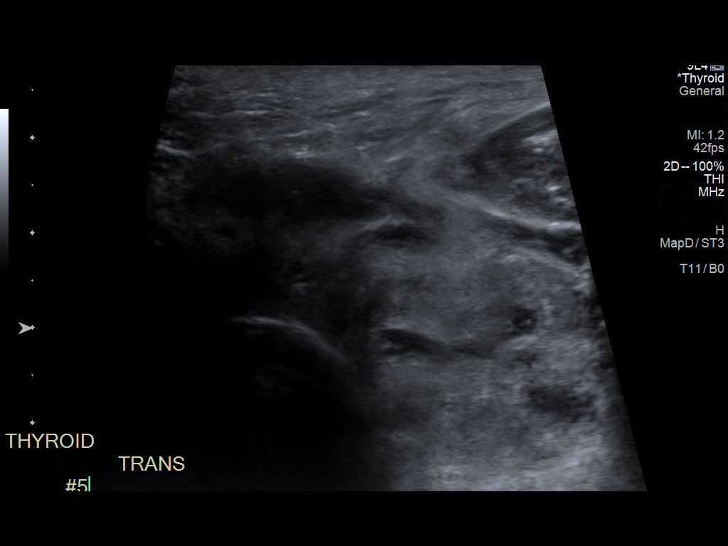
[im 13/16]
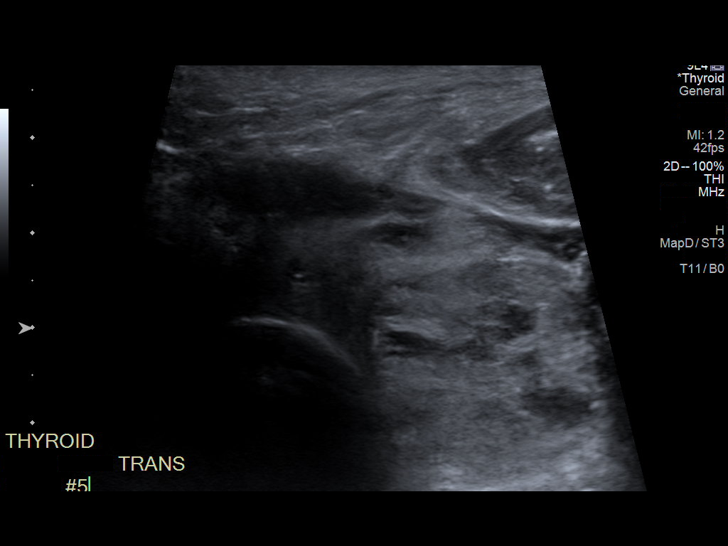
[im 15/16]
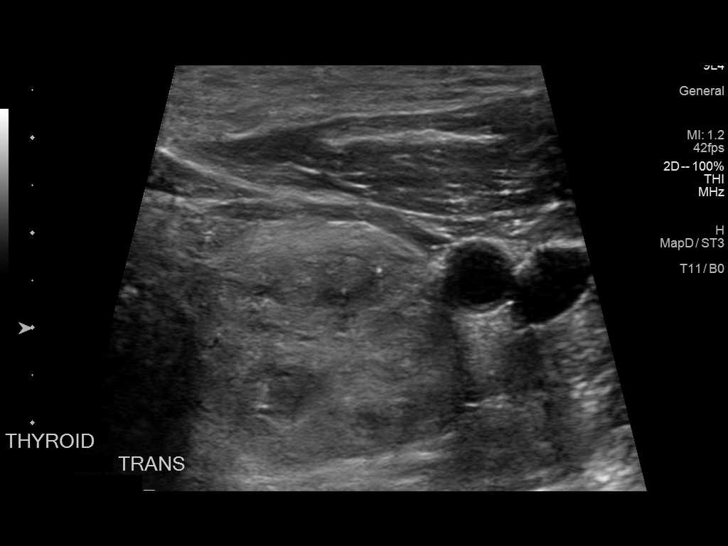
[im 16/16]
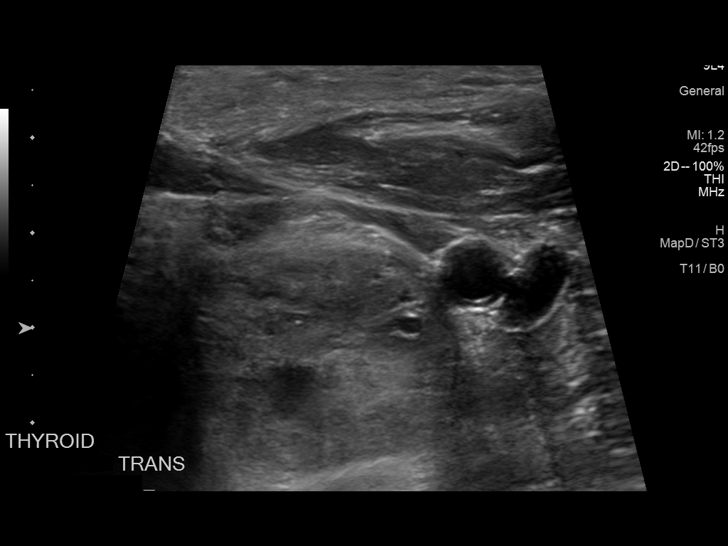

[13 of 16 positions shown; findings below may reference images not displayed]

Pre-procedural ultrasound scanning demonstrated unchanged size and
appearance of the indeterminate nodule within the left inferior lobe
of the thyroid.

The procedure was planned. The neck was prepped in the usual sterile
fashion, and a sterile drape was applied covering the operative
field. A timeout was performed prior to the initiation of the
procedure. Local anesthesia was provided with 1% lidocaine.

Under direct ultrasound guidance, 5 FNA biopsies were performed of
the left inferior thyroid nodule with a 25 gauge needle. Multiple
ultrasound images were saved for procedural documentation purposes.
The samples were prepared and submitted to pathology.

Limited post procedural scanning was negative for hematoma or
additional complication. Dressings were placed. The patient
tolerated the above procedures procedure well without immediate
postprocedural complication.
FINDINGS: FINDINGS
Nodule reference number based on prior diagnostic ultrasound: 3

Maximum size: 3.8 cm

Location: Left  ;  Inferior

ACR TI-RADS risk category:  TR3

Reason for biopsy: meets ACR TI-RADS criteria

Ultrasound imaging confirms appropriate placement of the needles
within the thyroid nodule.
IMPRESSION: Technically successful ultrasound guided fine needle aspiration of
the left inferior thyroid nodule.

## 2021-12-05 IMAGING — US US FNA BIOPSY THYROID 1ST LESION
1 series · 12 of 12 positions shown · non-contrast
Comparison: US 02/18/2019

INDICATION: Indeterminate thyroid nodule

Left inferior thyroid nodule
3.8 cm
Bx 03/26/19
FINAL MICROSCOPIC DIAGNOSIS:
- Atypia of undetermined significance ([REDACTED] category III)
EXAM:
ULTRASOUND GUIDED FINE NEEDLE ASPIRATION OF INDETERMINATE THYROID
NODULE
TECHNIQUE: Informed written consent was obtained from the patient after a
discussion of the risks, benefits and alternatives to treatment.
Questions regarding the procedure were encouraged and answered. A
timeout was performed prior to the initiation of the procedure.

[Series 1: us fna biopsy thyroid 1st lesion · 0.09mm/px · 12 acquisitions, 12 frames shown]
[im 1/12]
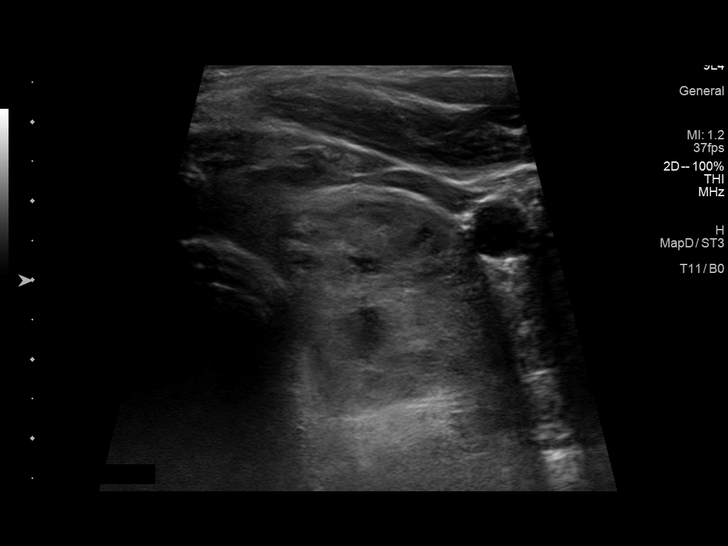
[im 2/12]
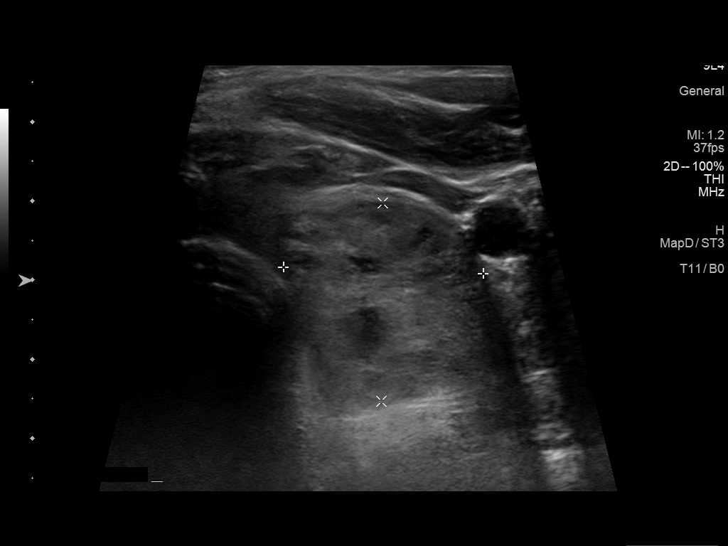
[im 3/12]
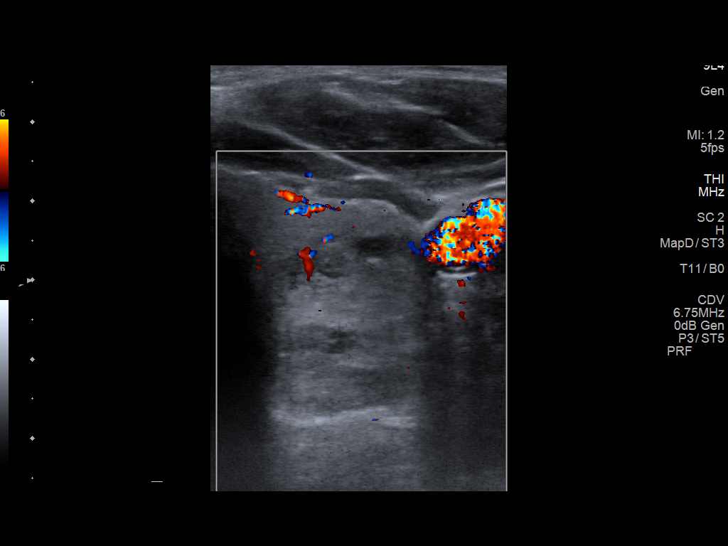
[im 4/12]
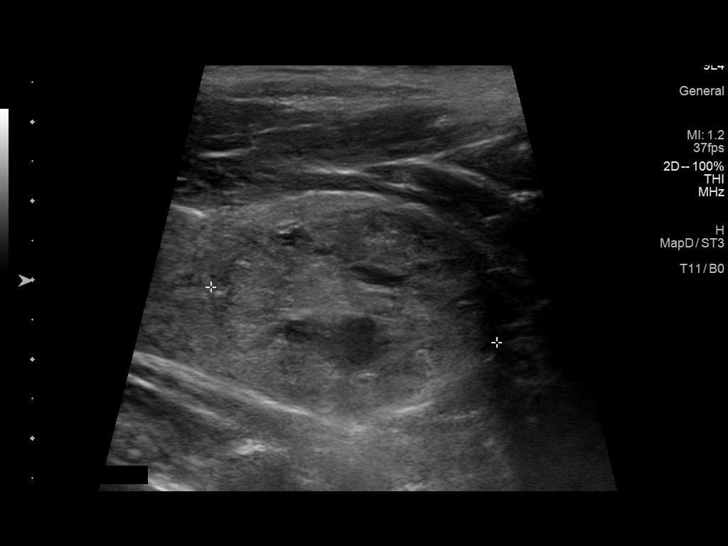
[im 5/12]
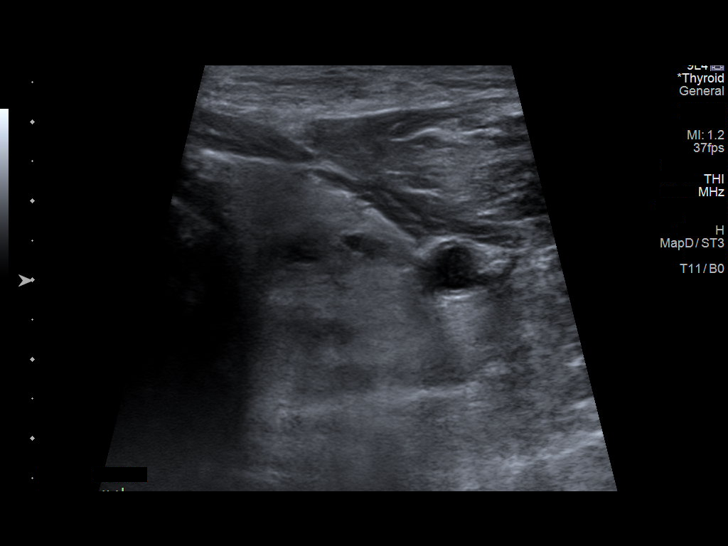
[im 6/12]
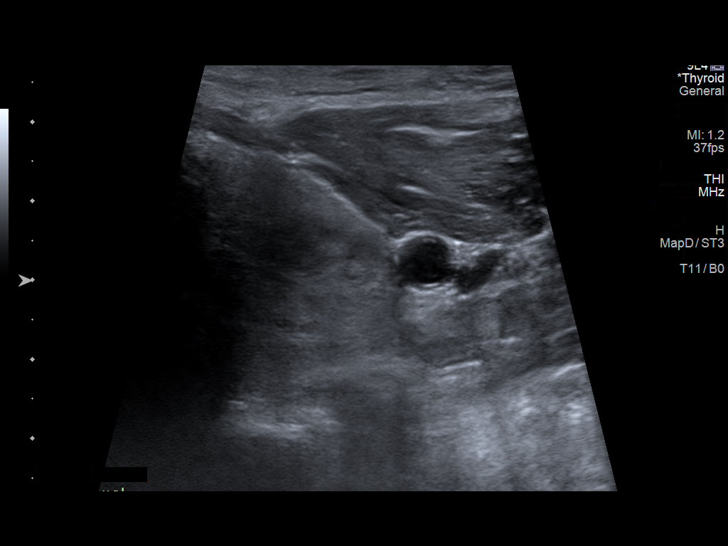
[im 7/12]
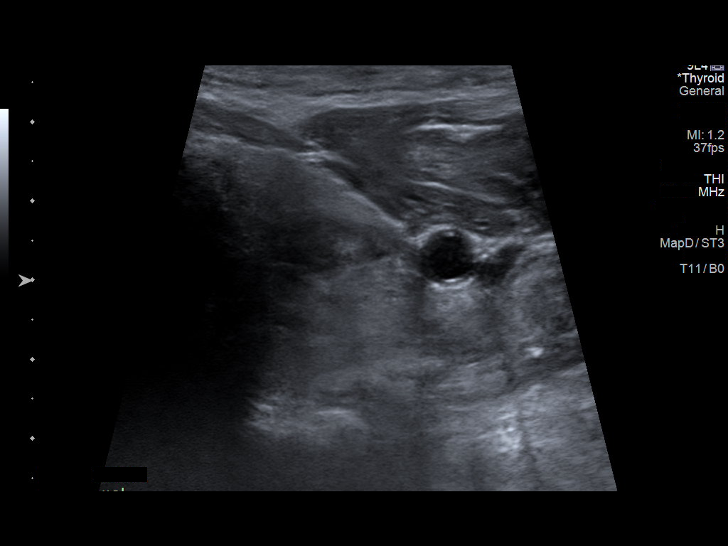
[im 8/12]
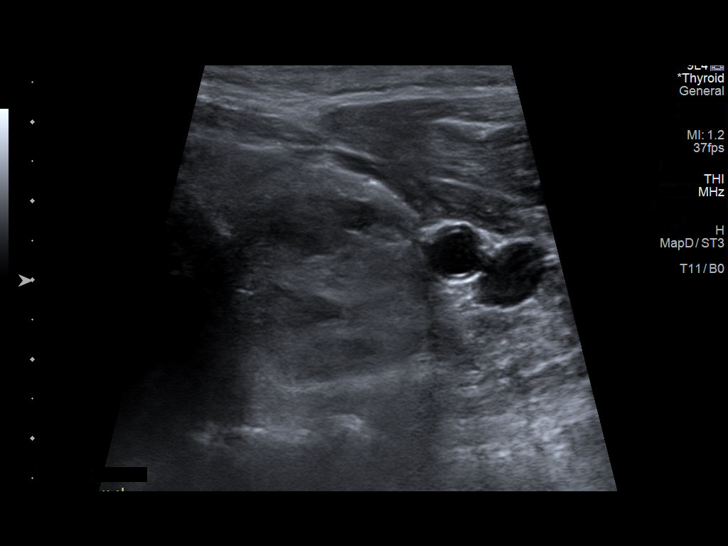
[im 9/12]
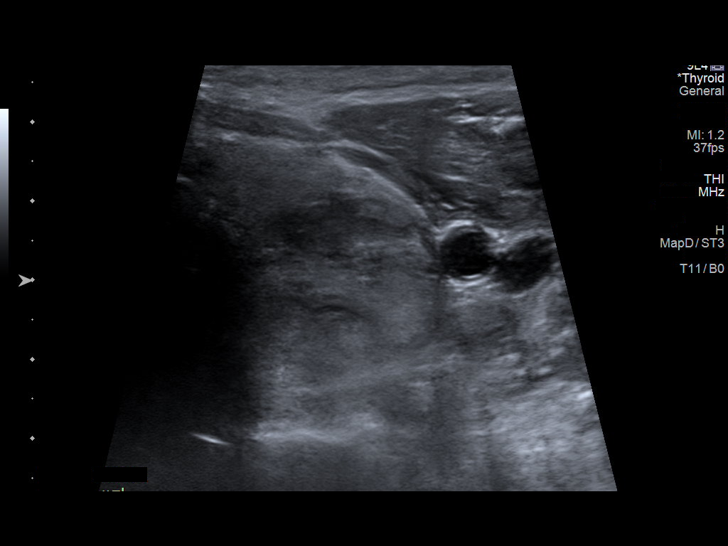
[im 10/12]
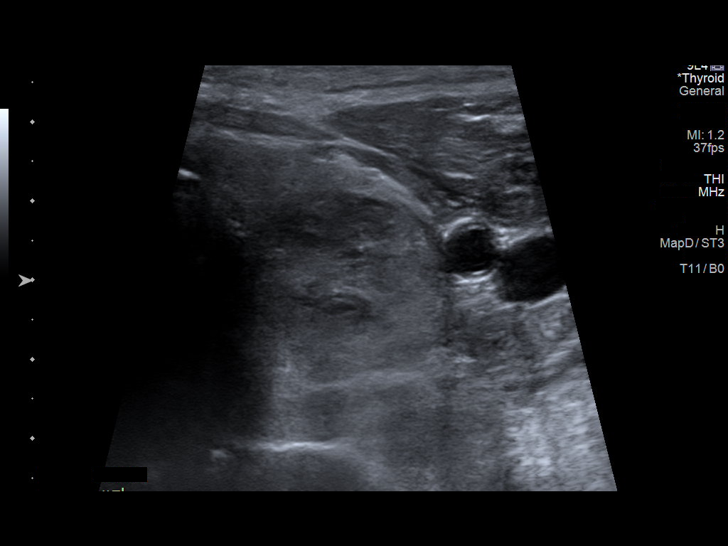
[im 11/12]
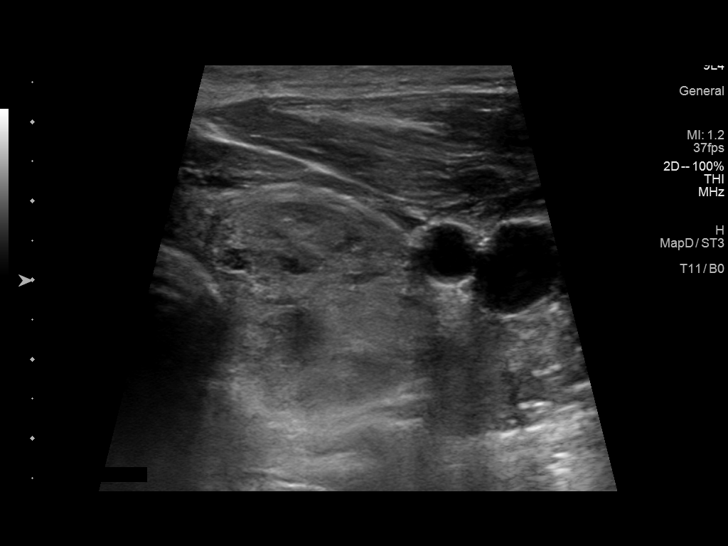
[im 12/12]
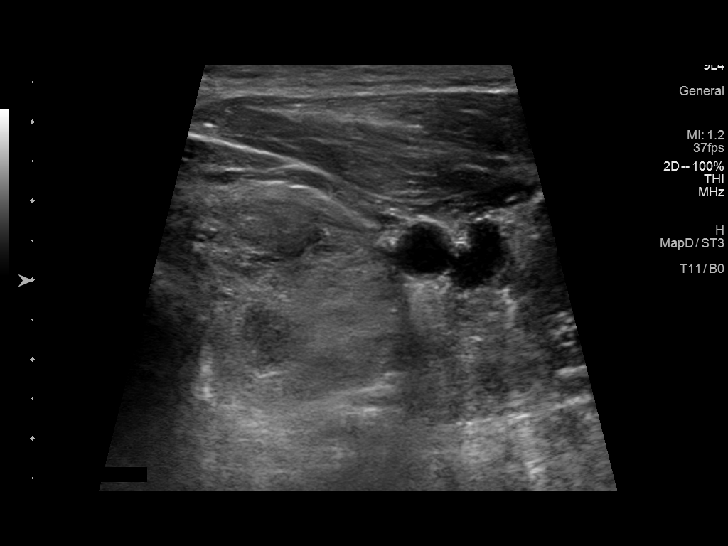

[12 of 12 positions shown; findings below may reference images not displayed]

Bx 03/26/19

FINAL MICROSCOPIC DIAGNOSIS:

- Atypia of undetermined significance ([REDACTED] category III)

MEDICATIONS:
5 cc 1% lidocaine

COMPLICATIONS:
None immediate.
Pre-procedural ultrasound scanning demonstrated unchanged size and
appearance of the indeterminate nodule within the left thyroid

The procedure was planned. The neck was prepped in the usual sterile
fashion, and a sterile drape was applied covering the operative
field. A timeout was performed prior to the initiation of the
procedure. Local anesthesia was provided with 1% lidocaine.

Under direct ultrasound guidance, 5 FNA biopsies were performed of
the left inferior thyroid nodule with a 25 gauge needle.

2 of these samples were obtained for AFIRMA as per ordering JIM

Multiple ultrasound images were saved for procedural documentation
purposes. The samples were prepared and submitted to pathology.

Limited post procedural scanning was negative for hematoma or
additional complication. Dressings were placed. The patient
tolerated the above procedures procedure well without immediate
postprocedural complication.
FINDINGS: Nodule reference number based on prior diagnostic ultrasound: 3

Maximum size: 3.8 cm

Location: Left; Inferior

ACR TI-RADS risk category: TR3 (3 points)

Reason for biopsy: indeterminate (FLUS, MQRKO, Follicular neoplasm) on
prior biopsy

Ultrasound imaging confirms appropriate placement of the needles
within the thyroid nodule.
IMPRESSION: Technically successful ultrasound guided fine needle aspiration of
left inferior thyroid nodule

Read by

Deeqa Rayaan Adlaho

## 2021-12-24 ENCOUNTER — Emergency Department (HOSPITAL_COMMUNITY)
Admission: EM | Admit: 2021-12-24 | Discharge: 2021-12-25 | Disposition: A | Discharge status: Home / Self Care | Payer: Self-pay | Attending: Emergency Medicine | Admitting: Emergency Medicine

## 2021-12-24 ENCOUNTER — Other Ambulatory Visit: Payer: Self-pay

## 2021-12-24 DIAGNOSIS — S81851A Open bite, right lower leg, initial encounter: Secondary | ICD-10-CM | POA: Insufficient documentation

## 2021-12-24 DIAGNOSIS — Y9289 Other specified places as the place of occurrence of the external cause: Secondary | ICD-10-CM | POA: Insufficient documentation

## 2021-12-24 DIAGNOSIS — W5501XA Bitten by cat, initial encounter: Secondary | ICD-10-CM | POA: Insufficient documentation

## 2021-12-24 DIAGNOSIS — Y9389 Activity, other specified: Secondary | ICD-10-CM | POA: Insufficient documentation

## 2021-12-24 DIAGNOSIS — Z7982 Long term (current) use of aspirin: Secondary | ICD-10-CM | POA: Insufficient documentation

## 2021-12-24 DIAGNOSIS — Y998 Other external cause status: Secondary | ICD-10-CM | POA: Insufficient documentation

## 2021-12-24 NOTE — ED Triage Notes (Signed)
Patient reports cat bite at his right lower leg this evening .

## 2021-12-25 MED ORDER — AMOXICILLIN-POT CLAVULANATE 875-125 MG PO TABS
1.0000 | ORAL_TABLET | Freq: Two times a day (BID) | ORAL | 0 refills | Status: DC
  Filled 2021-12-25: qty 14, 7d supply, fill #0

## 2021-12-25 NOTE — Discharge Instructions (Addendum)
Please take all of your antibiotics until finished!   You may develop abdominal discomfort or diarrhea from the antibiotic.  You may help offset this with probiotics which you can buy or get in yogurt. Do not eat  or take the probiotics until 2 hours after your antibiotic.   

## 2021-12-26 ENCOUNTER — Other Ambulatory Visit: Payer: Self-pay

## 2021-12-26 ENCOUNTER — Ambulatory Visit (INDEPENDENT_AMBULATORY_CARE_PROVIDER_SITE_OTHER): Payer: Self-pay | Admitting: Podiatry

## 2021-12-26 ENCOUNTER — Encounter: Payer: Self-pay | Admitting: Podiatry

## 2021-12-26 DIAGNOSIS — N1831 Chronic kidney disease, stage 3a: Secondary | ICD-10-CM

## 2021-12-26 DIAGNOSIS — M79674 Pain in right toe(s): Secondary | ICD-10-CM

## 2021-12-26 DIAGNOSIS — E119 Type 2 diabetes mellitus without complications: Secondary | ICD-10-CM

## 2021-12-26 DIAGNOSIS — B351 Tinea unguium: Secondary | ICD-10-CM

## 2021-12-26 DIAGNOSIS — M79675 Pain in left toe(s): Secondary | ICD-10-CM

## 2021-12-26 NOTE — Progress Notes (Signed)
This patient returns to my office for at risk foot care.  This patient requires this care by a professional since this patient will be at risk due to having diabetes and CKD.  This patient is unable to cut nails himself since the patient cannot reach his nails.These nails are painful walking and wearing shoes.  This patient presents for at risk foot care today.  General Appearance  Alert, conversant and in no acute stress.  Vascular  Dorsalis pedis and posterior tibial  pulses are palpable  bilaterally.  Capillary return is within normal limits  bilaterally. Temperature is within normal limits  bilaterally.  Neurologic  Senn-Weinstein monofilament wire test diminished  bilaterally. Muscle power within normal limits bilaterally.  Nails Thick disfigured discolored nails with subungual debris  from hallux to fifth toes bilaterally. No evidence of bacterial infection or drainage bilaterally.  Orthopedic  No limitations of motion  feet .  No crepitus or effusions noted.  No bony pathology or digital deformities noted.  Skin  normotropic skin with no porokeratosis noted bilaterally.  No signs of infections or ulcers noted.   Fizzure left arch .    Onychomycosis  Pain in right toes  Pain in left toes  Consent was obtained for treatment procedures.   Mechanical debridement of nails 1-5  bilaterally performed with a nail nipper.  Filed with dremel without incident. Told him to use chapstick. He does not use vaseline and never picked up his lotrisone.   Return office visit     3 months                Told patient to return for periodic foot care and evaluation due to potential at risk complications.   Helane Gunther DPM

## 2021-12-27 ENCOUNTER — Other Ambulatory Visit: Payer: Self-pay

## 2021-12-29 ENCOUNTER — Other Ambulatory Visit: Payer: Self-pay

## 2021-12-30 ENCOUNTER — Other Ambulatory Visit: Payer: Self-pay

## 2022-01-01 NOTE — ED Provider Notes (Signed)
[facility] EMERGENCY DEPARTMENT Provider Note   CSN: [id number] Arrival date & time: 12/24/21  2307     History  Chief Complaint  Patient presents with   Cat Bite (right leg)     Lonnie Brown is a 61 y.o. male.  HPI  Patient is a 61 year old gentleman presented emergency room today with cat bite to the right upper calf.  He states that this occurred earlier today.  He states that he is up-to-date on tetanus he states that this is his cat he is not concerned about rabies.  No significant bleeding.     Home Medications Prior to Admission medications   Medication Sig Start Date End Date Taking? Authorizing Provider  amoxicillin-clavulanate (AUGMENTIN) 875-125 MG tablet Take 1 tablet by mouth every 12 (twelve) hours. 12/25/21  Yes Imogen Maddalena S, PA  amLODipine (NORVASC) 10 MG tablet TAKE 1 TABLET (10 MG TOTAL) BY MOUTH DAILY. 03/28/21   Marcine Matar, MD  aspirin 81 MG EC tablet Take 1 tablet (81 mg total) by mouth daily. 11/20/19   Marcine Matar, MD  atorvastatin (LIPITOR) 80 MG tablet TAKE 1 TABLET (80 MG TOTAL) BY MOUTH DAILY AT 6 PM. 03/11/21 03/11/22  Marcine Matar, MD  clotrimazole-betamethasone (LOTRISONE) cream Apply 1 Application topically 2 (two) times daily. 09/26/21   Felecia Shelling, DPM  COVID-19 mRNA vaccine, Moderna, 100 MCG/0.5ML injection Inject into the muscle. 06/17/20   Judyann Munson, MD  hydrochlorothiazide (HYDRODIURIL) 25 MG tablet Take 1 tablet (25 mg total) by mouth daily. 03/28/21   Marcine Matar, MD  metFORMIN (GLUCOPHAGE) 500 MG tablet TAKE 1 TABLET (500 MG TOTAL) BY MOUTH DAILY WITH BREAKFAST. 03/28/21   Marcine Matar, MD  sildenafil (VIAGRA) 50 MG tablet Take 1 tab by mouth half hour to 1 hour before sexual intercourse.  Limit to 1 tablet per 24-hour. 09/30/20   Marcine Matar, MD      Allergies    Lisinopril and Banana    Review of Systems   Review of Systems  Physical Exam Updated Vital  Signs BP (!) 156/96   Pulse 84   Temp 97.9 F (36.6 C)   Resp 20   SpO2 94%  Physical Exam Vitals and nursing note reviewed.  Constitutional:      General: He is not in acute distress.    Appearance: Normal appearance. He is not ill-appearing.  HENT:     Head: Normocephalic and atraumatic.  Eyes:     General: No scleral icterus.       Right eye: No discharge.        Left eye: No discharge.     Conjunctiva/sclera: Conjunctivae normal.  Pulmonary:     Effort: Pulmonary effort is normal.     Breath sounds: No stridor.  Skin:    Comments: Small puncture wound on lateral right calf see picture below no palpable foreign body  Neurological:     Mental Status: He is alert and oriented to person, place, and time. Mental status is at baseline.       ED Results / Procedures / Treatments   Labs (all labs ordered are listed, but only abnormal results are displayed) Labs Reviewed - No data to display  EKG None  Radiology No results found.  Procedures Procedures    Medications Ordered in ED Medications - No data to display  ED Course/ Medical Decision Making/ A&P  Medical Decision Making Risk Prescription drug management.   Patient is a 61 year old gentleman presented emergency room today with cat bite to the right upper calf.  He states that this occurred earlier today.  He states that he is up-to-date on tetanus he states that this is his cat he is not concerned about rabies.  No significant bleeding.  Discussed the utility of x-ray I have low suspicion for foreign body and this is a small puncture wound.  Will treat with Augmentin I personally irrigated the wound. Tylenol for discomfort.  Final Clinical Impression(s) / ED Diagnoses Final diagnoses:  Cat bite, initial encounter    Rx / DC Orders ED Discharge Orders          Ordered    amoxicillin-clavulanate (AUGMENTIN) 875-125 MG tablet  Every 12 hours        12/25/21 0009               Tedd Sias, [place] 01/01/22 [place], [place], DO 01/01/22 1301

## 2022-01-04 ENCOUNTER — Ambulatory Visit (INDEPENDENT_AMBULATORY_CARE_PROVIDER_SITE_OTHER): Payer: Self-pay | Admitting: Nurse Practitioner

## 2022-01-04 ENCOUNTER — Encounter: Payer: Self-pay | Admitting: Nurse Practitioner

## 2022-01-04 ENCOUNTER — Other Ambulatory Visit: Payer: Self-pay

## 2022-01-04 VITALS — BP 146/71 | HR 94 | Temp 97.6°F | Ht 71.0 in | Wt 311.0 lb

## 2022-01-04 DIAGNOSIS — E119 Type 2 diabetes mellitus without complications: Secondary | ICD-10-CM

## 2022-01-04 DIAGNOSIS — I10 Essential (primary) hypertension: Secondary | ICD-10-CM

## 2022-01-04 DIAGNOSIS — Z23 Encounter for immunization: Secondary | ICD-10-CM | POA: Insufficient documentation

## 2022-01-04 MED ORDER — SPIRONOLACTONE 25 MG PO TABS
25.0000 mg | ORAL_TABLET | Freq: Every day | ORAL | 0 refills | Status: DC
  Filled 2022-01-04: qty 30, 30d supply, fill #0
  Filled 2022-02-20: qty 30, 30d supply, fill #1

## 2022-01-04 NOTE — Progress Notes (Signed)
Acute Office Visit  Subjective:     Patient ID: Lonnie Brown, male    DOB: [birth year], 61 y.o.   MRN: [id number]  Chief Complaint  Patient presents with   Establish Care    HPI Lonnie Brown with past medical history of hypertension, type 2 diabetes, CVA, hyperlipidemia, CKD stage III erectile dysfunction, morbid obesity presents for physical examination needed in order for him join a society [place] [place].  Patient currently denies chest pain, shortness of breath, fever, chills, polyuria, polydipsia, polyphagia, dizziness.    Hypertension.  Currently on amlodipine 10 mg daily, hydrochlorothiazide 25 mg daily.  Patient denies chest pain, dizziness, edema he reports that he has been taking both medications daily as prescribed he has not been checking his blood pressure at home.  He is allergic to lisinopril.  Type 2 diabetes currently on metformin 500 mg by mouth daily.  Patient denies polyphagia, polyuria, polydipsia.     Patient of Dr. Jonah Brown.      Objective:    BP (!) 146/71   Pulse 94   Temp 97.6 F (36.4 C)   Ht 5\' 11"  (1.803 m)   Wt (!) 311 lb (141.1 kg)   SpO2 95%   BMI 43.38 kg/m    Physical Exam Constitutional:      General: He is not in acute distress.    Appearance: Normal appearance. He is obese. He is not ill-appearing, toxic-appearing or diaphoretic.  HENT:     Right Ear: External ear normal.     Left Ear: External ear normal.  Eyes:     General: No scleral icterus.       Right eye: No discharge.        Left eye: No discharge.     Extraocular Movements: Extraocular movements intact.     Conjunctiva/sclera: Conjunctivae normal.  Cardiovascular:     Rate and Rhythm: Normal rate and regular rhythm.     Pulses: Normal pulses.     Heart sounds: Normal heart sounds. No murmur heard.    No friction rub. No gallop.  Pulmonary:     Effort: Pulmonary effort is normal. No respiratory distress.     Breath sounds:  Normal breath sounds. No stridor. No wheezing, rhonchi or rales.  Chest:     Chest wall: No tenderness.  Abdominal:     General: There is no distension.     Palpations: Abdomen is soft. There is no mass.     Tenderness: There is no abdominal tenderness. There is no right CVA tenderness, left CVA tenderness, guarding or rebound.     Hernia: No hernia is present.  Musculoskeletal:        General: No swelling, tenderness, deformity or signs of injury.     Cervical back: Normal range of motion. No rigidity.     Right lower leg: No edema.     Left lower leg: No edema.  Skin:    General: Skin is warm and dry.     Capillary Refill: Capillary refill takes less than 2 seconds.     Coloration: Skin is not jaundiced or pale.  Neurological:     Mental Status: He is alert and oriented to person, place, and time.     Cranial Nerves: No cranial nerve deficit.     Sensory: No sensory deficit.     Motor: No weakness.     Coordination: Coordination normal.     Gait: Gait normal.  Psychiatric:  Mood and Affect: Mood normal.        Behavior: Behavior normal.        Thought Content: Thought content normal.        Judgment: Judgment normal.     No results found for any visits on 01/04/22.      Assessment & Plan:   Problem List Items Addressed This Visit       Cardiovascular and Mediastinum   Hypertension (Chronic)    BP Readings from Last 3 Encounters:  01/04/22 (!) 146/71  12/24/21 (!) 156/96  03/28/21 (!) 148/110  Blood pressure goal is less than 130/80 Continue amlodipine 10 mg daily, hydrochlorothiazide 25 mg daily Will check BMP, if labs come back okay patient can start taking spironolactone 25 mg daily.  He will follow-up with nurse in 2 weeks at this office to recheck his blood pressure and to also recheck BMP.  He was encouraged not to pick up prescription for spironolactone and see lab returns. DASH diet advised patient encouraged to engage in regular moderate exercises at  least 150 minutes weekly      Relevant Medications   spironolactone (ALDACTONE) 25 MG tablet     Endocrine   Controlled type 2 diabetes mellitus without complication, without long-term current use of insulin (HCC)    Lab Results  Component Value Date   HGBA1C 7.4 (A) 03/28/2021  Currently on metformin 500 mg daily Will check A1c since he is due for labs Patient counseled on low-carb modified diet      Relevant Orders   Hemoglobin A1c     Other   Need for immunization against influenza - Primary    Patient educated on CDC recommendation for the influenza vaccine. Verbal consent was obtained from the patient, vaccine administered by nurse, no sign of adverse reactions noted at this time. Patient education on arm soreness and use of tylenol  for this patient  was discussed. Patient educated on the signs and symptoms of adverse effect and advise to contact the office if they occur.       Relevant Orders   Flu Vaccine QUAD 59mo+IM (Fluarix, Fluzone & Alfiuria Quad PF) (Completed)   Basic Metabolic Panel    Meds ordered this encounter  Medications   spironolactone (ALDACTONE) 25 MG tablet    Sig: Take 1 tablet (25 mg total) by mouth daily.    Dispense:  60 tablet    Refill:  0    Return in about 4 weeks (around 02/01/2022) for HTN.  Donell Beers, FNP

## 2022-01-04 NOTE — Assessment & Plan Note (Signed)
Patient educated on CDC recommendation for the influenza vaccine. Verbal consent was obtained from the patient, vaccine administered by nurse, no sign of adverse reactions noted at this time. Patient education on arm soreness and use of tylenol for this patient  was discussed. Patient educated on the signs and symptoms of adverse effect and advise to contact the office if they occur.  

## 2022-01-04 NOTE — Assessment & Plan Note (Addendum)
BP Readings from Last 3 Encounters:  01/04/22 (!) 146/71  12/24/21 (!) 156/96  03/28/21 (!) 148/110  Blood pressure goal is less than 130/80 Continue amlodipine 10 mg daily, hydrochlorothiazide 25 mg daily Will check BMP, if labs come back okay patient can start taking spironolactone 25 mg daily.  He will follow-up with nurse in 2 weeks at this office to recheck his blood pressure and to also recheck BMP.  He was encouraged not to pick up prescription for spironolactone and see lab returns. DASH diet advised patient encouraged to engage in regular moderate exercises at least 150 minutes weekly

## 2022-01-04 NOTE — Progress Notes (Deleted)
Established Patient Office Visit  Subjective:  Patient ID: Lonnie Brown, male    DOB: April 14, 1960  Age: 62 y.o. MRN: [id number]  CC:  Chief Complaint  Patient presents with   Establish Care    HPI Lonnie Brown is a 61 y.o. male with past medical history of *** presents for f/u of *** chronic medical conditions. He is here to be evaluated to join a society [place] [place] [employer].     Past Medical History:  Diagnosis Date   Diabetes ([place])    Hypercholesterolemia    Hypertension     Past Surgical History:  Procedure Laterality Date   EYE SURGERY     TONSILLECTOMY      Family History  Problem Relation Age of Onset   Diabetes Mother    Hypertension Mother    Alcoholism Father     Social History   Socioeconomic History   Marital status: Married    Spouse name: Not on file   Number of children: Not on file   Years of education: Not on file   Highest education level: Not on file  Occupational History   Not on file  Tobacco Use   Smoking status: Never   Smokeless tobacco: Never  Vaping Use   Vaping Use: Never used  Substance and Sexual Activity   Alcohol use: No   Drug use: No   Sexual activity: Not on file  Other Topics Concern   Not on file  Social History Narrative   Not on file   Social Determinants of Health   Financial Resource Strain: Not on file  Food Insecurity: Not on file  Transportation Needs: Not on file  Physical Activity: Not on file  Stress: Not on file  Social Connections: Not on file  Intimate Partner Violence: Not on file    Outpatient Medications Prior to Visit  Medication Sig Dispense Refill   amLODipine (NORVASC) 10 MG tablet TAKE 1 TABLET (10 MG TOTAL) BY MOUTH DAILY. 90 tablet 4   amoxicillin-clavulanate (AUGMENTIN) 875-125 MG tablet Take 1 tablet by mouth every 12 (twelve) hours. 14 tablet 0   aspirin 81 MG EC tablet Take 1 tablet (81 mg total) by mouth daily. 90 tablet 1   atorvastatin  (LIPITOR) 80 MG tablet TAKE 1 TABLET (80 MG TOTAL) BY MOUTH DAILY AT 6 PM. 30 tablet 5   hydrochlorothiazide (HYDRODIURIL) 25 MG tablet Take 1 tablet (25 mg total) by mouth daily. 90 tablet 3   metFORMIN (GLUCOPHAGE) 500 MG tablet TAKE 1 TABLET (500 MG TOTAL) BY MOUTH DAILY WITH BREAKFAST. 90 tablet 4   sildenafil (VIAGRA) 50 MG tablet Take 1 tab by mouth half hour to 1 hour before sexual intercourse.  Limit to 1 tablet per 24-hour. 10 tablet 5   clotrimazole-betamethasone (LOTRISONE) cream Apply 1 Application topically 2 (two) times daily. (Patient not taking: Reported on 01/04/2022) 45 g 3   COVID-19 mRNA vaccine, Moderna, 100 MCG/0.5ML injection Inject into the muscle. (Patient not taking: Reported on 01/04/2022) 0.25 mL 0   No facility-administered medications prior to visit.    Allergies  Allergen Reactions   Lisinopril Other (See Comments)    Angioedema   Banana Nausea And Vomiting    ROS Review of Systems    Objective:    Physical Exam  BP (!) 154/99   Pulse 94   Temp 97.6 F (36.4 C)   Ht _0  (1.803 m)   Wt (!) 311 lb (141.1 kg)  SpO2 95%   BMI 43.38 kg/m  Wt Readings from Last 3 Encounters:  01/04/22 (!) 311 lb (141.1 kg)  03/28/21 (!) 321 lb (145.6 kg)  08/10/20 (!) 305 lb 9.6 oz (138.6 kg)    Lab Results  Component Value Date   TSH 2.050 03/13/2019   Lab Results  Component Value Date   WBC 7.4 08/10/2020   HGB 14.3 08/10/2020   HCT 43.6 08/10/2020   MCV 88 08/10/2020   PLT 283 08/10/2020   Lab Results  Component Value Date   NA 137 03/28/2021   K 4.7 03/28/2021   CO2 24 03/28/2021   GLUCOSE 134 (H) 03/28/2021   BUN 16 03/28/2021   CREATININE 1.39 (H) 03/28/2021   BILITOT 0.5 08/10/2020   ALKPHOS 97 08/10/2020   AST 25 08/10/2020   ALT 42 08/10/2020   PROT 7.9 08/10/2020   ALBUMIN 4.3 08/10/2020   CALCIUM 10.2 03/28/2021   ANIONGAP 9 05/24/2019   EGFR 57 (L) 03/28/2021   Lab Results  Component Value Date   CHOL 184 08/10/2020    Lab Results  Component Value Date   HDL 33 (L) 08/10/2020   Lab Results  Component Value Date   LDLCALC 121 (H) 08/10/2020   Lab Results  Component Value Date   TRIG 170 (H) 08/10/2020   Lab Results  Component Value Date   CHOLHDL 5.6 (H) 08/10/2020   Lab Results  Component Value Date   HGBA1C 7.4 (A) 03/28/2021      Assessment & Plan:   Problem List Items Addressed This Visit   None Visit Diagnoses     Need for immunization against influenza    -  Primary   Relevant Orders   Flu Vaccine QUAD 17moIM (Fluarix, Fluzone & Alfiuria Quad PF) (Completed)       No orders of the defined types were placed in this encounter.   Follow-up: No follow-ups on file.    FRenee Rival FNP

## 2022-01-04 NOTE — Assessment & Plan Note (Signed)
Lab Results  Component Value Date   HGBA1C 7.4 (A) 03/28/2021  Currently on metformin 500 mg daily Will check A1c since he is due for labs Patient counseled on low-carb modified diet

## 2022-01-04 NOTE — Patient Instructions (Addendum)
   Hypertension, unspecified type  - spironolactone (ALDACTONE) 25 MG tablet; Take 1 tablet (25 mg total) by mouth daily.  Dispense: 60 tablet; Refill: 0  Around 3 times per week, check your blood pressure 2 times per day. once in the morning and once in the evening. The readings should be at least one minute apart. Write down these values and bring them to your next nurse visit/appointment.  When you check your BP, make sure you have been doing something calm/relaxing 5 minutes prior to checking. Both feet should be flat on the floor and you should be sitting. Use your left arm and make sure it is in a relaxed position (on a table), and that the cuff is at the approximate level/height of your heart.   It is important that you exercise regularly at least 30 minutes 5 times a week as tolerated  Think about what you will eat, plan ahead. Choose " clean, green, fresh or frozen" over canned, processed or packaged foods which are more sugary, salty and fatty. 70 to 75% of food eaten should be vegetables and fruit. Three meals at set times with snacks allowed between meals, but they must be fruit or vegetables. Aim to eat over a 12 hour period , example 7 am to 7 pm, and STOP after  your last meal of the day. Drink water,generally about 64 ounces per day, no other drink is as healthy. Fruit juice is best enjoyed in a healthy way, by EATING the fruit.  Thanks for choosing [facility] we consider it a privelige to serve you.

## 2022-01-05 ENCOUNTER — Other Ambulatory Visit: Payer: Self-pay

## 2022-01-05 DIAGNOSIS — Z23 Encounter for immunization: Secondary | ICD-10-CM

## 2022-01-05 DIAGNOSIS — E119 Type 2 diabetes mellitus without complications: Secondary | ICD-10-CM

## 2022-01-06 ENCOUNTER — Other Ambulatory Visit: Payer: Self-pay | Admitting: Nurse Practitioner

## 2022-01-06 ENCOUNTER — Other Ambulatory Visit: Payer: Self-pay

## 2022-01-06 DIAGNOSIS — E1169 Type 2 diabetes mellitus with other specified complication: Secondary | ICD-10-CM

## 2022-01-06 LAB — BASIC METABOLIC PANEL
BUN/Creatinine Ratio: 10 (ref 10–24)
BUN: 16 mg/dL (ref 8–27)
CO2: 22 mmol/L (ref 20–29)
Calcium: 10.1 mg/dL (ref 8.6–10.2)
Chloride: 99 mmol/L (ref 96–106)
Creatinine, Ser: 1.57 mg/dL — ABNORMAL HIGH (ref 0.76–1.27)
Glucose: 144 mg/dL — ABNORMAL HIGH (ref 70–99)
Potassium: 4.6 mmol/L (ref 3.5–5.2)
Sodium: 139 mmol/L (ref 134–144)
eGFR: 49 mL/min/{1.73_m2} — ABNORMAL LOW (ref >59)

## 2022-01-06 LAB — HEMOGLOBIN A1C
Est. average glucose Bld gHb Est-mCnc: 186 mg/dL
Hgb A1c MFr Bld: 8.1 % — ABNORMAL HIGH (ref 4.8–5.6)

## 2022-01-06 MED ORDER — METFORMIN HCL 1000 MG PO TABS
1000.0000 mg | ORAL_TABLET | Freq: Two times a day (BID) | ORAL | 1 refills | Status: DC
  Filled 2022-01-06: qty 180, 90d supply, fill #0
  Filled 2022-05-29: qty 180, 90d supply, fill #1
  Filled 2022-06-08: qty 60, 30d supply, fill #1
  Filled 2022-09-22: qty 60, 30d supply, fill #2

## 2022-01-06 NOTE — Progress Notes (Signed)
Start taking metformin 1000 mg twice daily.  Avoid sugar sweets soda  His kidney function is slightly worse than it was previously.  He should avoid use of ibuprofen, Aleve drink at least 64 ounces of water daily to maintain hydration.   Start taking spironolactone 25 mg daily as discussed.  Patient should follow-up for blood pressure check and repeat of BMP in 2 weeks.  Follow-up with me in the office in 4 weeks.  afterwards he will be following up with his PCP

## 2022-01-10 ENCOUNTER — Telehealth: Payer: Self-pay | Admitting: *Deleted

## 2022-01-10 ENCOUNTER — Other Ambulatory Visit: Payer: Self-pay

## 2022-01-10 NOTE — Telephone Encounter (Signed)
Notified pt [facility]  form is complete/sign ready to be pick-up at the front office.

## 2022-01-18 ENCOUNTER — Ambulatory Visit: Payer: Self-pay

## 2022-01-19 ENCOUNTER — Other Ambulatory Visit: Payer: Self-pay

## 2022-02-01 ENCOUNTER — Ambulatory Visit: Payer: Self-pay

## 2022-02-03 ENCOUNTER — Encounter: Payer: Self-pay | Admitting: Pharmacist

## 2022-02-06 ENCOUNTER — Ambulatory Visit: Payer: Self-pay | Admitting: Nurse Practitioner

## 2022-02-14 ENCOUNTER — Encounter: Payer: Self-pay | Admitting: Pharmacist

## 2022-02-15 ENCOUNTER — Ambulatory Visit: Payer: Self-pay

## 2022-02-20 ENCOUNTER — Ambulatory Visit (INDEPENDENT_AMBULATORY_CARE_PROVIDER_SITE_OTHER): Payer: Self-pay | Admitting: Nurse Practitioner

## 2022-02-20 ENCOUNTER — Encounter: Payer: Self-pay | Admitting: Nurse Practitioner

## 2022-02-20 ENCOUNTER — Other Ambulatory Visit: Payer: Self-pay

## 2022-02-20 VITALS — BP 153/91 | HR 77 | Temp 96.6°F | Ht 69.0 in | Wt 312.4 lb

## 2022-02-20 DIAGNOSIS — E119 Type 2 diabetes mellitus without complications: Secondary | ICD-10-CM

## 2022-02-20 DIAGNOSIS — I10 Essential (primary) hypertension: Secondary | ICD-10-CM

## 2022-02-20 NOTE — Progress Notes (Addendum)
Established Patient Office Visit  Subjective:  Patient ID: Lonnie Brown, male    DOB: May 05, 1960  Age: 62 y.o. MRN: [id number]  CC:  Chief Complaint  Patient presents with   Follow-up    F/u blood pressure.    HPI  Lonnie Brown with past medical history of hypertension, type 2 diabetes, CVA, hyperlipidemia, CKD stage III erectile dysfunction, morbid obesity presents for follow-up for hypertension.  Hypertension.  Currently has amlodipine 10 mg daily, hydrochlorothiazide 25 mg daily, spironolactone 25 mg daily ordered.  Patient stated that he took  only amlodipine and 1 other pill this morning.   He stated that I need to do better with taking my medications.  He denies chest pain, dizziness, syncope, edema.    Past Medical History:  Diagnosis Date   Diabetes ([place])    Hypercholesterolemia    Hypertension     Past Surgical History:  Procedure Laterality Date   EYE SURGERY     TONSILLECTOMY      Family History  Problem Relation Age of Onset   Diabetes Mother    Hypertension Mother    Alcoholism Father     Social History   Socioeconomic History   Marital status: Married    Spouse name: Not on file   Number of children: Not on file   Years of education: Not on file   Highest education level: Not on file  Occupational History   Not on file  Tobacco Use   Smoking status: Never   Smokeless tobacco: Never  Vaping Use   Vaping Use: Never used  Substance and Sexual Activity   Alcohol use: No   Drug use: No   Sexual activity: Not on file  Other Topics Concern   Not on file  Social History Narrative   Not on file   Social Determinants of Health   Financial Resource Strain: Not on file  Food Insecurity: Not on file  Transportation Needs: Not on file  Physical Activity: Not on file  Stress: Not on file  Social Connections: Not on file  Intimate Partner Violence: Not on file    Outpatient Medications Prior to Visit  Medication Sig Dispense Refill    amLODipine (NORVASC) 10 MG tablet TAKE 1 TABLET (10 MG TOTAL) BY MOUTH DAILY. 90 tablet 4   aspirin 81 MG EC tablet Take 1 tablet (81 mg total) by mouth daily. 90 tablet 1   hydrochlorothiazide (HYDRODIURIL) 25 MG tablet Take 1 tablet (25 mg total) by mouth daily. 90 tablet 3   metFORMIN (GLUCOPHAGE) 1000 MG tablet Take 1 tablet (1,000 mg total) by mouth 2 (two) times daily with a meal. 180 tablet 1   sildenafil (VIAGRA) 50 MG tablet Take 1 tab by mouth half hour to 1 hour before sexual intercourse.  Limit to 1 tablet per 24-hour. 10 tablet 5   atorvastatin (LIPITOR) 80 MG tablet TAKE 1 TABLET (80 MG TOTAL) BY MOUTH DAILY AT 6 PM. (Patient not taking: Reported on 02/20/2022) 30 tablet 5   clotrimazole-betamethasone (LOTRISONE) cream Apply 1 Application topically 2 (two) times daily. (Patient not taking: Reported on 01/04/2022) 45 g 3   COVID-19 mRNA vaccine, Moderna, 100 MCG/0.5ML injection Inject into the muscle. (Patient not taking: Reported on 01/04/2022) 0.25 mL 0   spironolactone (ALDACTONE) 25 MG tablet Take 1 tablet (25 mg total) by mouth daily. (Patient not taking: Reported on 02/20/2022) 60 tablet 0   amoxicillin-clavulanate (AUGMENTIN) 875-125 MG tablet Take 1 tablet by mouth every 12 (twelve)  hours. (Patient not taking: Reported on 02/20/2022) 14 tablet 0   No facility-administered medications prior to visit.    Allergies  Allergen Reactions   Lisinopril Other (See Comments)    Angioedema   Banana Nausea And Vomiting    ROS Review of Systems  Constitutional:  Negative for activity change, appetite change, chills, diaphoresis and fatigue.  Respiratory: Negative.  Negative for apnea, cough, choking, chest tightness and shortness of breath.   Cardiovascular: Negative.  Negative for chest pain, palpitations and leg swelling.  Musculoskeletal: Negative.   Neurological: Negative.  Negative for dizziness, facial asymmetry, light-headedness, numbness and headaches.   Psychiatric/Behavioral: Negative.  Negative for agitation, behavioral problems, confusion, decreased concentration and dysphoric mood.       Objective:    Physical Exam Constitutional:      General: He is not in acute distress.    Appearance: He is obese. He is not ill-appearing, toxic-appearing or diaphoretic.  Eyes:     General: No scleral icterus.       Right eye: No discharge.        Left eye: No discharge.     Extraocular Movements: Extraocular movements intact.     Conjunctiva/sclera: Conjunctivae normal.  Cardiovascular:     Rate and Rhythm: Normal rate and regular rhythm.     Pulses: Normal pulses.     Heart sounds: No murmur heard.    No friction rub. No gallop.  Pulmonary:     Effort: Pulmonary effort is normal. No respiratory distress.     Breath sounds: Normal breath sounds. No stridor. No wheezing, rhonchi or rales.  Chest:     Chest wall: No tenderness.  Abdominal:     General: There is no distension.     Palpations: Abdomen is soft.     Tenderness: There is no abdominal tenderness.  Musculoskeletal:        General: No swelling, tenderness, deformity or signs of injury.     Right lower leg: No edema.     Left lower leg: No edema.  Skin:    General: Skin is warm and dry.     Capillary Refill: Capillary refill takes less than 2 seconds.  Neurological:     Mental Status: He is alert and oriented to person, place, and time.     Cranial Nerves: No cranial nerve deficit.     Sensory: No sensory deficit.     Motor: No weakness.     Coordination: Coordination normal.     Gait: Gait normal.  Psychiatric:        Mood and Affect: Mood normal.        Behavior: Behavior normal.        Thought Content: Thought content normal.        Judgment: Judgment normal.     BP (!) 153/91   Pulse 77   Temp (!) 96.6 F (35.9 C)   Ht 5' 9"$  (1.753 m)   Wt (!) 312 lb 6.4 oz (141.7 kg)   SpO2 100%   BMI 46.13 kg/m  Wt Readings from Last 3 Encounters:  02/20/22 (!) 312  lb 6.4 oz (141.7 kg)  01/04/22 (!) 311 lb (141.1 kg)  03/28/21 (!) 321 lb (145.6 kg)    Lab Results  Component Value Date   TSH 2.050 03/13/2019   Lab Results  Component Value Date   WBC 7.4 08/10/2020   HGB 14.3 08/10/2020   HCT 43.6 08/10/2020   MCV 88 08/10/2020   PLT 283  08/10/2020   Lab Results  Component Value Date   NA 139 01/05/2022   K 4.6 01/05/2022   CO2 22 01/05/2022   GLUCOSE 144 (H) 01/05/2022   BUN 16 01/05/2022   CREATININE 1.57 (H) 01/05/2022   BILITOT 0.5 08/10/2020   ALKPHOS 97 08/10/2020   AST 25 08/10/2020   ALT 42 08/10/2020   PROT 7.9 08/10/2020   ALBUMIN 4.3 08/10/2020   CALCIUM 10.1 01/05/2022   ANIONGAP 9 05/24/2019   EGFR 49 (L) 01/05/2022   Lab Results  Component Value Date   CHOL 184 08/10/2020   Lab Results  Component Value Date   HDL 33 (L) 08/10/2020   Lab Results  Component Value Date   LDLCALC 121 (H) 08/10/2020   Lab Results  Component Value Date   TRIG 170 (H) 08/10/2020   Lab Results  Component Value Date   CHOLHDL 5.6 (H) 08/10/2020   Lab Results  Component Value Date   HGBA1C 8.1 (H) 01/05/2022      Assessment & Plan:   Problem List Items Addressed This Visit       Cardiovascular and Mediastinum   Hypertension - Primary (Chronic)    BP Readings from Last 3 Encounters:  02/20/22 (!) 153/91  01/04/22 (!) 146/71  12/24/21 (!) 156/96   Currently has amlodipine 10 mg daily, hydrochlorothiazide 25 mg daily, spironolactone 25 mg daily ordered.  Stated that he only took amlodipine and 1 ordered medications today.  Need to take all medications as appointment discussed, BP goal of less than 130/80 discussed.  Patient encouraged to call the office and let us know what medication he is taking when he gets home, so we can know what meds to refil. BMP today  Follow-up with his PCP in 4      Relevant Orders   Basic Metabolic Panel     Endocrine   Controlled type 2 diabetes mellitus without complication, without  long-term current use of insulin (HCC)    Lab Results  Component Value Date   HGBA1C 8.1 (H) 01/05/2022  Currently on metformin 1000 mg twice daily Continue current medication Avoid sugar sweets soda Engage in moderate to vigorous exercise at least 250 minutes weekly Patient was encouraged to maintain close follow-up with his PCP       No orders of the defined types were placed in this encounter.   Follow-up: No follow-ups on file.    Renee Rival, FNP

## 2022-02-20 NOTE — Patient Instructions (Signed)
Please call the office today and let us know what medications you have at home, please follow up with Dr Wynetta Emery in 4 weeks    Around 3 times per week, check your blood pressure 2 times per day. once in the morning and once in the evening. The readings should be at least one minute apart. Write down these values and bring them to your next nurse visit/appointment.  When you check your BP, make sure you have been doing something calm/relaxing 5 minutes prior to checking. Both feet should be flat on the floor and you should be sitting. Use your left arm and make sure it is in a relaxed position (on a table), and that the cuff is at the approximate level/height of your heart.   Your Blood pressure goal is less than 130/80    It is important that you exercise regularly at least 30 minutes 5 times a week as tolerated  Think about what you will eat, plan ahead. Choose " clean, green, fresh or frozen" over canned, processed or packaged foods which are more sugary, salty and fatty. 70 to 75% of food eaten should be vegetables and fruit. Three meals at set times with snacks allowed between meals, but they must be fruit or vegetables. Aim to eat over a 12 hour period , example 7 am to 7 pm, and STOP after  your last meal of the day. Drink water,generally about 64 ounces per day, no other drink is as healthy. Fruit juice is best enjoyed in a healthy way, by EATING the fruit.  Thanks for choosing [facility] we consider it a privelige to serve you.

## 2022-02-20 NOTE — Assessment & Plan Note (Addendum)
BP Readings from Last 3 Encounters:  02/20/22 (!) 153/91  01/04/22 (!) 146/71  12/24/21 (!) 156/96   Currently has amlodipine 10 mg daily, hydrochlorothiazide 25 mg daily, spironolactone 25 mg daily ordered.  Stated that he only took amlodipine and 1 ordered medications today.  Need to take all medications as appointment discussed, BP goal of less than 130/80 discussed.  Patient encouraged to call the office and let us know what medication he is taking when he gets home, so we can know what meds to refil. BMP today  Follow-up with his PCP in 4

## 2022-02-20 NOTE — Assessment & Plan Note (Signed)
Lab Results  Component Value Date   HGBA1C 8.1 (H) 01/05/2022  Currently on metformin 1000 mg twice daily Continue current medication Avoid sugar sweets soda Engage in moderate to vigorous exercise at least 250 minutes weekly Patient was encouraged to maintain close follow-up with his PCP

## 2022-02-21 ENCOUNTER — Other Ambulatory Visit: Payer: Self-pay

## 2022-02-21 ENCOUNTER — Other Ambulatory Visit: Payer: Self-pay | Admitting: Nurse Practitioner

## 2022-02-21 DIAGNOSIS — I10 Essential (primary) hypertension: Secondary | ICD-10-CM

## 2022-02-21 DIAGNOSIS — I663 Occlusion and stenosis of cerebellar arteries: Secondary | ICD-10-CM

## 2022-02-21 LAB — BASIC METABOLIC PANEL
BUN/Creatinine Ratio: 11 (ref 10–24)
BUN: 15 mg/dL (ref 8–27)
CO2: 24 mmol/L (ref 20–29)
Calcium: 9.2 mg/dL (ref 8.6–10.2)
Chloride: 102 mmol/L (ref 96–106)
Creatinine, Ser: 1.37 mg/dL — ABNORMAL HIGH (ref 0.76–1.27)
Glucose: 120 mg/dL — ABNORMAL HIGH (ref 70–99)
Potassium: 4.4 mmol/L (ref 3.5–5.2)
Sodium: 137 mmol/L (ref 134–144)
eGFR: 58 mL/min/{1.73_m2} — ABNORMAL LOW (ref >59)

## 2022-02-21 MED ORDER — SPIRONOLACTONE 25 MG PO TABS
25.0000 mg | ORAL_TABLET | Freq: Every day | ORAL | 0 refills | Status: DC
  Filled 2022-05-29: qty 90, 90d supply, fill #0
  Filled 2022-06-08: qty 30, 30d supply, fill #0
  Filled 2022-09-22: qty 30, 30d supply, fill #1

## 2022-02-21 MED ORDER — ATORVASTATIN CALCIUM 80 MG PO TABS
80.0000 mg | ORAL_TABLET | Freq: Every day | ORAL | 5 refills | Status: DC
  Filled 2022-05-29: qty 90, 90d supply, fill #0
  Filled 2022-06-06: qty 30, 30d supply, fill #0

## 2022-02-21 NOTE — Progress Notes (Signed)
Your kidney function is stable, continue current medications.  Drink at least 64 ounces of water daily to maintain hydration.  Please avoid use of ibuprofen or Aleve due to your decreased kidney function.  Take all medications as ordered.  Please call Dr. Durenda Age office and follow-up with her in 4 weeks, take all medications with you to that appointment.   Also please call our office with the list of medications you are taking currently so we can update that in your records.

## 2022-02-22 ENCOUNTER — Ambulatory Visit: Payer: Self-pay

## 2022-02-22 ENCOUNTER — Encounter (HOSPITAL_COMMUNITY): Payer: Self-pay

## 2022-02-22 ENCOUNTER — Other Ambulatory Visit: Payer: Self-pay

## 2022-02-22 ENCOUNTER — Ambulatory Visit (HOSPITAL_COMMUNITY)
Admission: EM | Admit: 2022-02-22 | Discharge: 2022-02-22 | Disposition: A | Discharge status: Home / Self Care | Payer: Self-pay | Attending: Family Medicine | Admitting: Family Medicine

## 2022-02-22 DIAGNOSIS — K529 Noninfective gastroenteritis and colitis, unspecified: Secondary | ICD-10-CM

## 2022-02-22 LAB — CBG MONITORING, ED: Glucose-Capillary: 170 mg/dL — ABNORMAL HIGH (ref 70–99)

## 2022-02-22 MED ORDER — ONDANSETRON 4 MG PO TBDP
4.0000 mg | ORAL_TABLET | Freq: Three times a day (TID) | ORAL | 0 refills | Status: DC | PRN
  Filled 2022-02-22: qty 10, 4d supply, fill #0

## 2022-02-22 MED ORDER — ONDANSETRON 4 MG PO TBDP
ORAL_TABLET | ORAL | Status: AC
  Filled 2022-02-22: qty 1

## 2022-02-22 MED ORDER — ONDANSETRON 4 MG PO TBDP: 4.0000 mg | ORAL_TABLET | Freq: Three times a day (TID) | ORAL | 0 refills | Status: DC | PRN

## 2022-02-22 MED ORDER — ONDANSETRON 4 MG PO TBDP
4.0000 mg | ORAL_TABLET | Freq: Once | ORAL | Status: AC
  Administered 2022-02-22: 4 mg via ORAL

## 2022-02-22 NOTE — ED Provider Notes (Signed)
[place]    CSN: [id number] Arrival date & time: 02/22/22  1533      History   Chief Complaint Chief Complaint  Patient presents with   GI situation    HPI Lonnie Brown is a 62 y.o. male.   HPI Here for feeling weak and nausea and vomiting and diarrhea.  He began feeling weak last evening and then he is thrown up 4 times today and had 4-5 episodes of diarrhea.  He has not felt dizzy and he is not come close to falling since this started.  He takes metformin for diabetes and Aldactone and HCTZ for blood pressure.  He took all those medicines this morning about 3 in the morning.  He has managed to get some water and Sprite and ginger ale in.  But he has started up after he is drunk some of those things.  He has not checked his sugar today  Earlier today he had had a little posterior headache and neck pain, but that went away with lying down.  Past Medical History:  Diagnosis Date   Diabetes ([place])    Hypercholesterolemia    Hypertension     Patient Active Problem List   Diagnosis Date Noted   Need for immunization against influenza 01/04/2022   TIA (transient ischemic attack) 02/06/2019   Cerebellar artery occlusion or stenosis 02/06/2019   Thyroid enlarged 02/06/2019   CVA (cerebral vascular accident) ([place]) 01/20/2019   Hyperlipidemia associated with type 2 diabetes mellitus ([place]) 12/02/2018   Chronic midline low back pain without sciatica 09/02/2018   Controlled type 2 diabetes mellitus without complication, without long-term current use of insulin ([place]) 03/01/2018   Erectile dysfunction 03/01/2018   Noncompliance with medications 03/01/2018   CKD (chronic kidney disease) stage 3, GFR 30-59 ml/min ([place]) 08/27/2017   Hyperlipidemia 09/25/2016   Morbid obesity ([place]) 07/20/2015   Achilles tendinitis 04/09/2015   Hypertension 03/04/2015    Past Surgical History:  Procedure Laterality Date   EYE SURGERY     TONSILLECTOMY         Home  Medications    Prior to Admission medications   Medication Sig Start Date End Date Taking? Authorizing Provider  ondansetron (ZOFRAN-ODT) 4 MG disintegrating tablet Take 1 tablet (4 mg total) by mouth every 8 (eight) hours as needed for nausea or vomiting. 02/22/22  Yes Maryela Tapper, Gwenlyn Perking, MD  amLODipine (NORVASC) 10 MG tablet TAKE 1 TABLET (10 MG TOTAL) BY MOUTH DAILY. 03/28/21   Ladell Pier, MD  aspirin 81 MG EC tablet Take 1 tablet (81 mg total) by mouth daily. 11/20/19   Ladell Pier, MD  atorvastatin (LIPITOR) 80 MG tablet TAKE 1 TABLET (80 MG TOTAL) BY MOUTH DAILY AT 6 PM. 02/21/22 02/21/23  Paseda, Dewaine Conger, FNP  hydrochlorothiazide (HYDRODIURIL) 25 MG tablet Take 1 tablet (25 mg total) by mouth daily. 03/28/21   Ladell Pier, MD  metFORMIN (GLUCOPHAGE) 1000 MG tablet Take 1 tablet (1,000 mg total) by mouth 2 (two) times daily with a meal. 01/06/22   Paseda, Dewaine Conger, FNP  sildenafil (VIAGRA) 50 MG tablet Take 1 tab by mouth half hour to 1 hour before sexual intercourse.  Limit to 1 tablet per 24-hour. 09/30/20   Ladell Pier, MD  spironolactone (ALDACTONE) 25 MG tablet Take 1 tablet (25 mg total) by mouth daily. 02/21/22   Renee Rival, FNP    Family History Family History  Problem Relation Age of Onset   Diabetes Mother  Hypertension Mother    Alcoholism Father     Social History Social History   Tobacco Use   Smoking status: Never   Smokeless tobacco: Never  Vaping Use   Vaping Use: Never used  Substance Use Topics   Alcohol use: No   Drug use: No     Allergies   Lisinopril and Banana   Review of Systems Review of Systems   Physical Exam Triage Vital Signs ED Triage Vitals [02/22/22 1552]  Enc Vitals Group     BP (!) 145/93     Pulse Rate 95     Resp 18     Temp 98 F (36.7 C)     Temp Source Oral     SpO2 95 %     Weight      Height      Head Circumference      Peak Flow      Pain Score 0     Pain Loc      Pain  Edu?      Excl. in [place]?    No data found.  Updated Vital Signs BP (!) 145/93 (BP Location: Left Arm)   Pulse 95   Temp 98 F (36.7 C) (Oral)   Resp 18   SpO2 95%   Visual Acuity Right Eye Distance:   Left Eye Distance:   Bilateral Distance:    Right Eye Near:   Left Eye Near:    Bilateral Near:     Physical Exam Vitals reviewed.  Constitutional:      General: He is not in acute distress.    Appearance: He is not ill-appearing, toxic-appearing or diaphoretic.  HENT:     Nose: Nose normal.     Mouth/Throat:     Mouth: Mucous membranes are moist.     Pharynx: No oropharyngeal exudate or posterior oropharyngeal erythema.  Eyes:     Extraocular Movements: Extraocular movements intact.     Pupils: Pupils are equal, round, and reactive to light.  Cardiovascular:     Rate and Rhythm: Normal rate and regular rhythm.     Heart sounds: No murmur heard. Pulmonary:     Effort: Pulmonary effort is normal.     Breath sounds: Normal breath sounds.  Abdominal:     General: There is no distension.     Palpations: Abdomen is soft.     Tenderness: There is no abdominal tenderness. There is no guarding.  Musculoskeletal:     Cervical back: Neck supple.  Lymphadenopathy:     Cervical: No cervical adenopathy.  Skin:    Capillary Refill: Capillary refill takes less than 2 seconds.     Coloration: Skin is not pale.  Neurological:     General: No focal deficit present.     Mental Status: He is oriented to person, place, and time.  Psychiatric:        Behavior: Behavior normal.      UC Treatments / Results  Labs (all labs ordered are listed, but only abnormal results are displayed) Labs Reviewed  CBG MONITORING, ED - Abnormal; Notable for the following components:      Result Value   Glucose-Capillary 170 (*)    All other components within normal limits    EKG   Radiology No results found.  Procedures Procedures (including critical care time)  Medications Ordered  in UC Medications  ondansetron (ZOFRAN-ODT) disintegrating tablet 4 mg (4 mg Oral Given 02/22/22 1617)    Initial Impression / Assessment  and Plan / UC Course  I have reviewed the triage vital signs and the nursing notes.  Pertinent labs & imaging results that were available during my care of the patient were reviewed by me and considered in my medical decision making (see chart for details).       We administered Zofran here and offered water.  Sugar was 170.  He did have 3 or 4 diarrhea spells while he was waiting here in the clinic, but he has not thrown up since he has been here.  He is kept down water.  He is overall well-appearing and his vital signs are reassuring.  More Zofran is sent to the pharmacy and they are going to use Imodium at home to slow down the diarrhea.  We discussed clear liquids and a bland diet.  We discussed not taking his diuretics or his metformin in the morning if he is not getting in much calories or much fluids.  If he continues to vomit or have very frequent diarrhea despite medication, he is going to present to the emergency room Final Clinical Impressions(s) / UC Diagnoses   Final diagnoses:  Gastroenteritis     Discharge Instructions      Ondansetron dissolved in the mouth every 8 hours as needed for nausea or vomiting. Clear liquids and bland things to eat. Avoid acidic foods like lemon/lime/orange/tomato. You were given 1 dose of this medication at 4:15 PM.  Try taking Imodium over-the-counter as needed for the diarrhea.  If you continue to vomit or have such frequent diarrhea despite medication, please consider going to the emergency room for further evaluation     ED Prescriptions     Medication Sig Dispense Auth. Provider   ondansetron (ZOFRAN-ODT) 4 MG disintegrating tablet Take 1 tablet (4 mg total) by mouth every 8 (eight) hours as needed for nausea or vomiting. 10 tablet Windy Carina Gwenlyn Perking, MD      PDMP not reviewed this  encounter.   Barrett Henle, MD 02/22/22 [phone]

## 2022-02-22 NOTE — Discharge Instructions (Signed)
Ondansetron dissolved in the mouth every 8 hours as needed for nausea or vomiting. Clear liquids and bland things to eat. Avoid acidic foods like lemon/lime/orange/tomato. You were given 1 dose of this medication at 4:15 PM.  Try taking Imodium over-the-counter as needed for the diarrhea.  If you continue to vomit or have such frequent diarrhea despite medication, please consider going to the emergency room for further evaluation

## 2022-02-22 NOTE — Telephone Encounter (Signed)
     Chief Complaint: Fatigue, veryweak Symptoms: Above Frequency: Today Pertinent Negatives: Patient denies fever Disposition: [x] ED /[] Urgent Care (no appt availability in office) / [] Appointment(In office/virtual)/ []  Early Virtual Care/ [] Home Care/ [] Refused Recommended Disposition /[] [facility]/ []  Follow-up with PCP Additional Notes:    Reason for Disposition  Patient sounds very sick or weak to the triager  Answer Assessment - Initial Assessment Questions 1. DESCRIPTION: "Describe how you are feeling."     Fatigue 2. SEVERITY: "How bad is it?"  "Can you stand and walk?"   - MILD (0-3): Feels weak or tired, but does not interfere with work, school or normal activities.   - MODERATE (4-7): Able to stand and walk; weakness interferes with work, school, or normal activities.   - SEVERE (8-10): Unable to stand or walk; unable to do usual activities.     Moderate 3. ONSET: "When did these symptoms begin?" (e.g., hours, days, weeks, months)     Today 4. CAUSE: "What do you think is causing the weakness or fatigue?" (e.g., not drinking enough fluids, medical problem, trouble sleeping)     Unsure 5. NEW MEDICINES:  "Have you started on any new medicines recently?" (e.g., opioid pain medicines, benzodiazepines, muscle relaxants, antidepressants, antihistamines, neuroleptics, beta blockers)     No 6. OTHER SYMPTOMS: "Do you have any other symptoms?" (e.g., chest pain, fever, cough, SOB, vomiting, diarrhea, bleeding, other areas of pain)     No                                                                                           7. PREGNANCY: "Is there any chance you are pregnant?" "When was your last menstrual period?"     N/a  Protocols used: Weakness (Generalized) and Fatigue-A-AH

## 2022-02-22 NOTE — ED Triage Notes (Signed)
Pt c/o fatigue, malaise, "maybe a case of the diarrhea," when asked if he experienced any nausea or vomiting pt responded "I think it was both."   Onset ~ yesterday   Denies pain in triage

## 2022-02-23 ENCOUNTER — Other Ambulatory Visit: Payer: Self-pay

## 2022-02-23 ENCOUNTER — Emergency Department (HOSPITAL_COMMUNITY)
Admission: EM | Admit: 2022-02-23 | Discharge: 2022-02-23 | Disposition: A | Discharge status: Home / Self Care | Payer: Self-pay | Attending: Emergency Medicine | Admitting: Emergency Medicine

## 2022-02-23 ENCOUNTER — Encounter (HOSPITAL_COMMUNITY): Payer: Self-pay | Admitting: Emergency Medicine

## 2022-02-23 DIAGNOSIS — R112 Nausea with vomiting, unspecified: Secondary | ICD-10-CM | POA: Insufficient documentation

## 2022-02-23 DIAGNOSIS — Z79899 Other long term (current) drug therapy: Secondary | ICD-10-CM | POA: Insufficient documentation

## 2022-02-23 DIAGNOSIS — E1122 Type 2 diabetes mellitus with diabetic chronic kidney disease: Secondary | ICD-10-CM | POA: Insufficient documentation

## 2022-02-23 DIAGNOSIS — R197 Diarrhea, unspecified: Secondary | ICD-10-CM | POA: Insufficient documentation

## 2022-02-23 DIAGNOSIS — Z20822 Contact with and (suspected) exposure to covid-19: Secondary | ICD-10-CM | POA: Insufficient documentation

## 2022-02-23 DIAGNOSIS — Z7984 Long term (current) use of oral hypoglycemic drugs: Secondary | ICD-10-CM | POA: Insufficient documentation

## 2022-02-23 DIAGNOSIS — Z7982 Long term (current) use of aspirin: Secondary | ICD-10-CM | POA: Insufficient documentation

## 2022-02-23 DIAGNOSIS — I129 Hypertensive chronic kidney disease with stage 1 through stage 4 chronic kidney disease, or unspecified chronic kidney disease: Secondary | ICD-10-CM | POA: Insufficient documentation

## 2022-02-23 DIAGNOSIS — E871 Hypo-osmolality and hyponatremia: Secondary | ICD-10-CM | POA: Insufficient documentation

## 2022-02-23 DIAGNOSIS — N183 Chronic kidney disease, stage 3 unspecified: Secondary | ICD-10-CM | POA: Insufficient documentation

## 2022-02-23 LAB — COMPREHENSIVE METABOLIC PANEL
ALT: 45 U/L — ABNORMAL HIGH (ref 0–44)
AST: 31 U/L (ref 15–41)
Albumin: 3.5 g/dL (ref 3.5–5.0)
Alkaline Phosphatase: 75 U/L (ref 38–126)
Anion gap: 10 (ref 5–15)
BUN: 20 mg/dL (ref 8–23)
CO2: 20 mmol/L — ABNORMAL LOW (ref 22–32)
Calcium: 8.6 mg/dL — ABNORMAL LOW (ref 8.9–10.3)
Chloride: 99 mmol/L (ref 98–111)
Creatinine, Ser: 1.63 mg/dL — ABNORMAL HIGH (ref 0.61–1.24)
GFR, Estimated: 47 mL/min — ABNORMAL LOW (ref >60)
Glucose, Bld: 157 mg/dL — ABNORMAL HIGH (ref 70–99)
Potassium: 4.2 mmol/L (ref 3.5–5.1)
Sodium: 129 mmol/L — ABNORMAL LOW (ref 135–145)
Total Bilirubin: 1.2 mg/dL (ref 0.3–1.2)
Total Protein: 7.9 g/dL (ref 6.5–8.1)

## 2022-02-23 LAB — URINALYSIS, ROUTINE W REFLEX MICROSCOPIC
Bacteria, UA: NONE SEEN
Bilirubin Urine: NEGATIVE
Glucose, UA: NEGATIVE mg/dL
Hgb urine dipstick: NEGATIVE
Ketones, ur: NEGATIVE mg/dL
Leukocytes,Ua: NEGATIVE
Nitrite: NEGATIVE
Protein, ur: 30 mg/dL — AB
Specific Gravity, Urine: 1.023 (ref 1.005–1.030)
pH: 5 (ref 5.0–8.0)

## 2022-02-23 LAB — RESP PANEL BY RT-PCR (RSV, FLU A&B, COVID)  RVPGX2
Influenza A by PCR: NEGATIVE
Influenza B by PCR: NEGATIVE
Resp Syncytial Virus by PCR: NEGATIVE
SARS Coronavirus 2 by RT PCR: NEGATIVE

## 2022-02-23 LAB — CBC
HCT: 45.8 % (ref 39.0–52.0)
Hemoglobin: 15.3 g/dL (ref 13.0–17.0)
MCH: 29.5 pg (ref 26.0–34.0)
MCHC: 33.4 g/dL (ref 30.0–36.0)
MCV: 88.2 fL (ref 80.0–100.0)
Platelets: 216 10*3/uL (ref 150–400)
RBC: 5.19 MIL/uL (ref 4.22–5.81)
RDW: 13 % (ref 11.5–15.5)
WBC: 6.4 10*3/uL (ref 4.0–10.5)
nRBC: 0 % (ref 0.0–0.2)

## 2022-02-23 LAB — LIPASE, BLOOD: Lipase: 38 U/L (ref 11–51)

## 2022-02-23 MED ORDER — LOPERAMIDE HCL 2 MG PO CAPS: 2.0000 mg | ORAL_CAPSULE | Freq: Four times a day (QID) | ORAL | 0 refills | Status: DC | PRN

## 2022-02-23 MED ORDER — LOPERAMIDE HCL 2 MG PO CAPS
2.0000 mg | ORAL_CAPSULE | Freq: Four times a day (QID) | ORAL | 0 refills | Status: DC | PRN
  Filled 2022-02-23: qty 12, 3d supply, fill #0

## 2022-02-23 MED ORDER — ACETAMINOPHEN 325 MG PO TABS
650.0000 mg | ORAL_TABLET | Freq: Once | ORAL | Status: AC | PRN
  Administered 2022-02-23: 650 mg via ORAL
  Filled 2022-02-23: qty 2

## 2022-02-23 NOTE — ED Provider Notes (Signed)
[facility] Provider Note  CSN: [id number] Arrival date & time: 02/23/22 [id number]  Chief Complaint(s) Abdominal Pain  HPI Lonnie Brown is a 62 y.o. male with a past medical history listed below including hypertension, hyperlipidemia and diabetes who presents to the emergency department with persistent diarrhea and abdominal discomfort.  Patient was seen in urgent care yesterday for the same as well as nausea and vomiting.  He was diagnosed with gastroenteritis and prescribed Zofran which she has not been able to pick up yet.  He returns today due to the continued diarrhea.  Patient reports that he has not had much of an appetite and is trying to hydrate.  Denies any coughs or shortness of breath.  No chest pain.  No abdominal pain.  No urinary symptoms. No recent antibiotic, travel, or suspicious food intact.  The history is provided by the patient.    Past Medical History Past Medical History:  Diagnosis Date   Diabetes ([place])    Hypercholesterolemia    Hypertension    Patient Active Problem List   Diagnosis Date Noted   Need for immunization against influenza 01/04/2022   TIA (transient ischemic attack) 02/06/2019   Cerebellar artery occlusion or stenosis 02/06/2019   Thyroid enlarged 02/06/2019   CVA (cerebral vascular accident) ([place]) 01/20/2019   Hyperlipidemia associated with type 2 diabetes mellitus ([place]) 12/02/2018   Chronic midline low back pain without sciatica 09/02/2018   Controlled type 2 diabetes mellitus without complication, without long-term current use of insulin ([place]) 03/01/2018   Erectile dysfunction 03/01/2018   Noncompliance with medications 03/01/2018   CKD (chronic kidney disease) stage 3, GFR 30-59 ml/min ([place]) 08/27/2017   Hyperlipidemia 09/25/2016   Morbid obesity ([place]) 07/20/2015   Achilles tendinitis 04/09/2015   Hypertension 03/04/2015   Home Medication(s) Prior to Admission medications   Medication Sig  Start Date End Date Taking? Authorizing Provider  amLODipine (NORVASC) 10 MG tablet TAKE 1 TABLET (10 MG TOTAL) BY MOUTH DAILY. 03/28/21   Ladell Pier, MD  aspirin 81 MG EC tablet Take 1 tablet (81 mg total) by mouth daily. 11/20/19   Ladell Pier, MD  atorvastatin (LIPITOR) 80 MG tablet TAKE 1 TABLET (80 MG TOTAL) BY MOUTH DAILY AT 6 PM. 02/21/22 02/21/23  Paseda, Dewaine Conger, FNP  hydrochlorothiazide (HYDRODIURIL) 25 MG tablet Take 1 tablet (25 mg total) by mouth daily. 03/28/21   Ladell Pier, MD  loperamide (IMODIUM) 2 MG capsule Take 1 capsule (2 mg total) by mouth 4 (four) times daily as needed for diarrhea or loose stools. 02/23/22   Fatima Blank, MD  metFORMIN (GLUCOPHAGE) 1000 MG tablet Take 1 tablet (1,000 mg total) by mouth 2 (two) times daily with a meal. 01/06/22   Paseda, Dewaine Conger, FNP  ondansetron (ZOFRAN-ODT) 4 MG disintegrating tablet Take 1 tablet (4 mg total) by mouth every 8 (eight) hours as needed for nausea or vomiting. 02/22/22   Barrett Henle, MD  sildenafil (VIAGRA) 50 MG tablet Take 1 tab by mouth half hour to 1 hour before sexual intercourse.  Limit to 1 tablet per 24-hour. 09/30/20   Ladell Pier, MD  spironolactone (ALDACTONE) 25 MG tablet Take 1 tablet (25 mg total) by mouth daily. 02/21/22   Renee Rival, FNP  Allergies Lisinopril and Banana  Review of Systems Review of Systems As noted in HPI  Physical Exam Vital Signs  I have reviewed the triage vital signs BP (!) 149/102 (BP Location: Right Arm)   Pulse 85   Temp (!) 100.4 F (38 C) (Oral)   Resp 20   Ht 5' 9"$  (1.753 m)   Wt (!) 142.4 kg   SpO2 96%   BMI 46.37 kg/m   Physical Exam Vitals reviewed.  Constitutional:      General: He is not in acute distress.    Appearance: He is well-developed. He is not diaphoretic.  HENT:      Head: Normocephalic and atraumatic.     Nose: Nose normal.  Eyes:     General: No scleral icterus.       Right eye: No discharge.        Left eye: No discharge.     Conjunctiva/sclera: Conjunctivae normal.     Pupils: Pupils are equal, round, and reactive to light.  Cardiovascular:     Rate and Rhythm: Normal rate and regular rhythm.     Heart sounds: No murmur heard.    No friction rub. No gallop.  Pulmonary:     Effort: Pulmonary effort is normal. No respiratory distress.     Breath sounds: Normal breath sounds. No stridor. No rales.  Abdominal:     General: There is no distension.     Palpations: Abdomen is soft.     Tenderness: There is no abdominal tenderness. There is no guarding or rebound.  Musculoskeletal:        General: No tenderness.     Cervical back: Normal range of motion and neck supple.  Skin:    General: Skin is warm and dry.     Findings: No erythema or rash.  Neurological:     Mental Status: He is alert and oriented to person, place, and time.     ED Results and Treatments Labs (all labs ordered are listed, but only abnormal results are displayed) Labs Reviewed  COMPREHENSIVE METABOLIC PANEL - Abnormal; Notable for the following components:      Result Value   Sodium 129 (*)    CO2 20 (*)    Glucose, Bld 157 (*)    Creatinine, Ser 1.63 (*)    Calcium 8.6 (*)    ALT 45 (*)    GFR, Estimated 47 (*)    All other components within normal limits  URINALYSIS, ROUTINE W REFLEX MICROSCOPIC - Abnormal; Notable for the following components:   Protein, ur 30 (*)    All other components within normal limits  RESP PANEL BY RT-PCR (RSV, FLU A&B, COVID)  RVPGX2  LIPASE, BLOOD  CBC                                                                                                                         EKG  EKG Interpretation  Date/Time:  Thursday February 23 2022 01:46:40 EST  Ventricular Rate:  101 PR Interval:  150 QRS Duration: 100 QT Interval:  344 QTC  Calculation: 446 R Axis:   -43 Text Interpretation: Sinus tachycardia Left axis deviation Incomplete right bundle branch block Inferior infarct , age undetermined Anterior infarct , age undetermined Abnormal ECG When compared with ECG of 24-May-2019 22:40, PREVIOUS ECG IS PRESENT Confirmed by Addison Lank [phone]) on 02/23/2022 5:13:40 AM       Radiology No results found.  Medications Ordered in ED Medications  acetaminophen (TYLENOL) tablet 650 mg (650 mg Oral Given 02/23/22 0136)                                                                                                                                     Procedures Procedures  (including critical care time)  Medical Decision Making / ED Course   Medical Decision Making Amount and/or Complexity of Data Reviewed Labs: ordered. Decision-making details documented in ED Course. ECG/medicine tests: ordered and independent interpretation performed. Decision-making details documented in ED Course.  Risk OTC drugs.    62 y.o. male presents with vomiting, diarrhea, and fever for 2 days. No possible suspicious food intake. No historical evidence to suggest suspicious toxic ingestion or exposure.  Adequate oral tolerance. Rest of history as above.  Patient appears well, not in distress, and with no signs of toxicity or dehydration. Abdomen benign.  Rest of the exam as above  Will get labs to assess for any electrolyte or metabolic derangements.  Most consistent with viral gastroenteritis.  CBC without leukocytosis or anemia.  Metabolic panel with mild hyponatremia.  Renal insufficiency but stable from patient's baseline.  No evidence of biliary obstruction or pancreatitis. Viral panel negative.  Doubt appendicitis, diverticulitis, severe colitis, dysentery.    Able to tolerate oral intake in the ED.  Discussed symptomatic treatment with the patient and they will follow closely with their PCP.       Final Clinical  Impression(s) / ED Diagnoses Final diagnoses:  Nausea vomiting and diarrhea   The patient appears reasonably screened and/or stabilized for discharge and I doubt any other medical condition or other [facility] requiring further screening, evaluation, or treatment in the ED at this time. I have discussed the findings, Dx and Tx plan with the patient/family who expressed understanding and agree(s) with the plan. Discharge instructions discussed at length. The patient/family was given strict return precautions who verbalized understanding of the instructions. No further questions at time of discharge.  Disposition: Discharge  Condition: Good  ED Discharge Orders          Ordered    loperamide (IMODIUM) 2 MG capsule  4 times daily PRN,   Status:  Discontinued        02/23/22 0633    loperamide (IMODIUM) 2 MG capsule  4 times daily PRN        02/23/22 0636  Follow Up: Ladell Pier, MD [address] [place] [address] [phone]  Call  to schedule an appointment for close follow up           This chart was dictated using voice recognition software.  Despite best efforts to proofread,  errors can occur which can change the documentation meaning.    Fatima Blank, MD 02/23/22 [phone]

## 2022-02-23 NOTE — ED Triage Notes (Signed)
Pt c/o generalized abdominal pain since yesterday. Endorses n/v/d. Denies fevers.

## 2022-02-27 ENCOUNTER — Other Ambulatory Visit: Payer: Self-pay

## 2022-03-01 NOTE — Progress Notes (Signed)
Labs results mail to pt home address.

## 2022-03-07 ENCOUNTER — Other Ambulatory Visit: Payer: Self-pay

## 2022-03-17 ENCOUNTER — Other Ambulatory Visit: Payer: Self-pay

## 2022-03-29 ENCOUNTER — Ambulatory Visit: Payer: Self-pay | Admitting: Podiatry

## 2022-03-31 ENCOUNTER — Encounter (HOSPITAL_COMMUNITY): Payer: Self-pay | Admitting: *Deleted

## 2022-03-31 ENCOUNTER — Other Ambulatory Visit: Payer: Self-pay

## 2022-03-31 ENCOUNTER — Ambulatory Visit: Payer: Self-pay

## 2022-03-31 ENCOUNTER — Ambulatory Visit (HOSPITAL_COMMUNITY)
Admission: EM | Admit: 2022-03-31 | Discharge: 2022-03-31 | Disposition: A | Discharge status: Home / Self Care | Payer: Self-pay | Attending: Emergency Medicine | Admitting: Emergency Medicine

## 2022-03-31 DIAGNOSIS — R1012 Left upper quadrant pain: Secondary | ICD-10-CM

## 2022-03-31 LAB — CBC WITH DIFFERENTIAL/PLATELET
Abs Immature Granulocytes: 0.04 10*3/uL (ref 0.00–0.07)
Basophils Absolute: 0.1 10*3/uL (ref 0.0–0.1)
Basophils Relative: 1 %
Eosinophils Absolute: 0.1 10*3/uL (ref 0.0–0.5)
Eosinophils Relative: 1 %
HCT: 46.1 % (ref 39.0–52.0)
Hemoglobin: 15.4 g/dL (ref 13.0–17.0)
Immature Granulocytes: 0 %
Lymphocytes Relative: 29 %
Lymphs Abs: 2.6 10*3/uL (ref 0.7–4.0)
MCH: 29.3 pg (ref 26.0–34.0)
MCHC: 33.4 g/dL (ref 30.0–36.0)
MCV: 87.8 fL (ref 80.0–100.0)
Monocytes Absolute: 0.8 10*3/uL (ref 0.1–1.0)
Monocytes Relative: 9 %
Neutro Abs: 5.4 10*3/uL (ref 1.7–7.7)
Neutrophils Relative %: 60 %
Platelets: 305 10*3/uL (ref 150–400)
RBC: 5.25 MIL/uL (ref 4.22–5.81)
RDW: 13 % (ref 11.5–15.5)
WBC: 9 10*3/uL (ref 4.0–10.5)
nRBC: 0 % (ref 0.0–0.2)

## 2022-03-31 LAB — COMPREHENSIVE METABOLIC PANEL
ALT: 36 U/L (ref 0–44)
AST: 29 U/L (ref 15–41)
Albumin: 3.9 g/dL (ref 3.5–5.0)
Alkaline Phosphatase: 82 U/L (ref 38–126)
Anion gap: 16 — ABNORMAL HIGH (ref 5–15)
BUN: 21 mg/dL (ref 8–23)
CO2: 26 mmol/L (ref 22–32)
Calcium: 9.9 mg/dL (ref 8.9–10.3)
Chloride: 96 mmol/L — ABNORMAL LOW (ref 98–111)
Creatinine, Ser: 1.44 mg/dL — ABNORMAL HIGH (ref 0.61–1.24)
GFR, Estimated: 55 mL/min — ABNORMAL LOW (ref >60)
Glucose, Bld: 154 mg/dL — ABNORMAL HIGH (ref 70–99)
Potassium: 4 mmol/L (ref 3.5–5.1)
Sodium: 138 mmol/L (ref 135–145)
Total Bilirubin: 1.4 mg/dL — ABNORMAL HIGH (ref 0.3–1.2)
Total Protein: 8.1 g/dL (ref 6.5–8.1)

## 2022-03-31 LAB — LIPASE, BLOOD: Lipase: 31 U/L (ref 11–51)

## 2022-03-31 MED ORDER — LIDOCAINE VISCOUS HCL 2 % MT SOLN
15.0000 mL | Freq: Once | OROMUCOSAL | Status: AC
  Administered 2022-03-31: 15 mL via OROMUCOSAL

## 2022-03-31 MED ORDER — ALUM & MAG HYDROXIDE-SIMETH 200-200-20 MG/5ML PO SUSP
ORAL | Status: AC
  Filled 2022-03-31: qty 30

## 2022-03-31 MED ORDER — LIDOCAINE VISCOUS HCL 2 % MT SOLN
OROMUCOSAL | Status: AC
  Filled 2022-03-31: qty 15

## 2022-03-31 MED ORDER — ALUM & MAG HYDROXIDE-SIMETH 200-200-20 MG/5ML PO SUSP
30.0000 mL | Freq: Once | ORAL | Status: AC
  Administered 2022-03-31: 30 mL via ORAL

## 2022-03-31 MED ORDER — SUCRALFATE 1 G PO TABS
1.0000 g | ORAL_TABLET | Freq: Three times a day (TID) | ORAL | 0 refills | Status: DC
  Filled 2022-03-31: qty 120, 30d supply, fill #0

## 2022-03-31 MED ORDER — FAMOTIDINE 20 MG PO TABS
20.0000 mg | ORAL_TABLET | Freq: Two times a day (BID) | ORAL | 0 refills | Status: DC
  Filled 2022-03-31: qty 30, 15d supply, fill #0

## 2022-03-31 MED ORDER — FAMOTIDINE 20 MG PO TABS
20.0000 mg | ORAL_TABLET | Freq: Every day | ORAL | 0 refills | Status: DC
  Filled 2022-03-31 – 2022-06-08 (×2): qty 30, 30d supply, fill #0

## 2022-03-31 NOTE — Telephone Encounter (Signed)
     Chief Complaint: Upper left abdominal pain, comes and goes, getting more intense Symptoms: Above Frequency: Wednesday Pertinent Negatives: Patient denies diarrhea, constipation Disposition: [x] ED /[] Urgent Care (no appt availability in office) / [] Appointment(In office/virtual)/ []  [facility]/ [] Home Care/ [] Refused Recommended Disposition /[] [facility]/ []  Follow-up with PCP Additional Notes:   Reason for Disposition  Patient sounds very sick or weak to the triager  Answer Assessment - Initial Assessment Questions 1. LOCATION: "Where does it hurt?"      Upper left -  2. RADIATION: "Does the pain shoot anywhere else?" (e.g., chest, back)     Ribs 3. ONSET: "When did the pain begin?" (Minutes, hours or days ago)      Wednesday 4. SUDDEN: "Gradual or sudden onset?"     Gradual 5. PATTERN "Does the pain come and go, or is it constant?"    - If it comes and goes: "How long does it last?" "Do you have pain now?"     (Note: Comes and goes means the pain is intermittent. It goes away completely between bouts.)    - If constant: "Is it getting better, staying the same, or getting worse?"      (Note: Constant means the pain never goes away completely; most serious pain is constant and gets worse.)      Comes and goes 6. SEVERITY: "How bad is the pain?"  (e.g., Scale 1-10; mild, moderate, or severe)    - MILD (1-3): Doesn't interfere with normal activities, abdomen soft and not tender to touch.     - MODERATE (4-7): Interferes with normal activities or awakens from sleep, abdomen tender to touch.     - SEVERE (8-10): Excruciating pain, doubled over, unable to do any normal activities.       Now - Mild 7. RECURRENT SYMPTOM: "Have you ever had this type of stomach pain before?" If Yes, ask: "When was the last time?" and "What happened that time?"      No 8. CAUSE: "What do you think is causing the stomach pain?"     Unsure 9. RELIEVING/AGGRAVATING FACTORS: "What  makes it better or worse?" (e.g., antacids, bending or twisting motion, bowel movement)     No 10. OTHER SYMPTOMS: "Do you have any other symptoms?" (e.g., back pain, diarrhea, fever, urination pain, vomiting)       No  Protocols used: Abdominal Pain - Male-A-AH

## 2022-03-31 NOTE — ED Triage Notes (Signed)
Pt states he started having left sided rib pain on Wednesday when he was eating. He states it is worse when he breaths out. He hasn't taken any meds for this pain. He states the pain is worse when he eats.

## 2022-03-31 NOTE — Discharge Instructions (Addendum)
Your EKG was normal, I have low suspicion for a heart attack or cardiac event.  I suspect your symptoms could be related to gastritis, irritation of the stomach wall.  We are going to start you on some antacids to help treat acid reflux and overall decrease your stomach acid, to see if this helps your pain.  Please avoid caffeine, chocolate, spicy foods, food with high fat content, carbonated beverages, and peppermint.  You can take Tums as needed for abdominal discomfort.  Please eat small meals, as this may help as well.   Please seek immediate care if you develop chest pain, shortness of breath, nausea, vomiting, fever or worsening of condition.

## 2022-03-31 NOTE — ED Provider Notes (Signed)
[facility]    CSN: [id number] Arrival date & time: 03/31/22  1353      History   Chief Complaint Chief Complaint  Patient presents with   Flank Pain    HPI Mare Neisen is a 62 y.o. male.   Patient presents to clinic for left upper abdominal pain that started after a meal on Wednesday.  Reports it is worse with exhalation and when he eats.  Patient went to eat on his lunch break and noticed that his pain had increased.  Denies falls, chest pain, SOB or recent illness. Denies N/V/D. Previous emesis and diarrhea have resolved.   Reports compliance w/ HTN and cholesterol medications.     The history is provided by the patient and medical records.  Flank Pain Associated symptoms include abdominal pain. Pertinent negatives include no chest pain and no shortness of breath.    Past Medical History:  Diagnosis Date   Diabetes ([place])    Hypercholesterolemia    Hypertension     Patient Active Problem List   Diagnosis Date Noted   Need for immunization against influenza 01/04/2022   TIA (transient ischemic attack) 02/06/2019   Cerebellar artery occlusion or stenosis 02/06/2019   Thyroid enlarged 02/06/2019   CVA (cerebral vascular accident) ([place]) 01/20/2019   Hyperlipidemia associated with type 2 diabetes mellitus ([place]) 12/02/2018   Chronic midline low back pain without sciatica 09/02/2018   Controlled type 2 diabetes mellitus without complication, without long-term current use of insulin ([place]) 03/01/2018   Erectile dysfunction 03/01/2018   Noncompliance with medications 03/01/2018   CKD (chronic kidney disease) stage 3, GFR 30-59 ml/min ([place]) 08/27/2017   Hyperlipidemia 09/25/2016   Morbid obesity ([place]) 07/20/2015   Achilles tendinitis 04/09/2015   Hypertension 03/04/2015    Past Surgical History:  Procedure Laterality Date   EYE SURGERY     TONSILLECTOMY         Home Medications    Prior to Admission medications   Medication Sig Start Date  End Date Taking? Authorizing Provider  amLODipine (NORVASC) 10 MG tablet TAKE 1 TABLET (10 MG TOTAL) BY MOUTH DAILY. 03/28/21  Yes Ladell Pier, MD  aspirin 81 MG EC tablet Take 1 tablet (81 mg total) by mouth daily. 11/20/19  Yes Ladell Pier, MD  atorvastatin (LIPITOR) 80 MG tablet TAKE 1 TABLET (80 MG TOTAL) BY MOUTH DAILY AT 6 PM. 02/21/22 02/21/23 Yes Paseda, Dewaine Conger, FNP  hydrochlorothiazide (HYDRODIURIL) 25 MG tablet Take 1 tablet (25 mg total) by mouth daily. 03/28/21  Yes Ladell Pier, MD  loperamide (IMODIUM) 2 MG capsule Take 1 capsule (2 mg total) by mouth 4 (four) times daily as needed for diarrhea or loose stools. 02/23/22  Yes Card[place], Grayce Sessions, MD  metFORMIN (GLUCOPHAGE) 1000 MG tablet Take 1 tablet (1,000 mg total) by mouth 2 (two) times daily with a meal. 01/06/22  Yes Paseda, Dewaine Conger, FNP  ondansetron (ZOFRAN-ODT) 4 MG disintegrating tablet Take 1 tablet (4 mg total) by mouth every 8 (eight) hours as needed for nausea or vomiting. 02/22/22  Yes Barrett Henle, MD  sildenafil (VIAGRA) 50 MG tablet Take 1 tab by mouth half hour to 1 hour before sexual intercourse.  Limit to 1 tablet per 24-hour. 09/30/20  Yes Ladell Pier, MD  spironolactone (ALDACTONE) 25 MG tablet Take 1 tablet (25 mg total) by mouth daily. 02/21/22  Yes Paseda, Dewaine Conger, FNP  sucralfate (CARAFATE) 1 g tablet Take 1 tablet (1 g total) by  mouth 4 (four) times daily -  with meals and at bedtime. 03/31/22 04/30/22 Yes Louretta Shorten, [place] N, FNP  famotidine (PEPCID) 20 MG tablet Take 1 tablet (20 mg total) by mouth daily. 03/31/22 04/30/22  Anylah Scheib, [place] N, FNP    Family History Family History  Problem Relation Age of Onset   Diabetes Mother    Hypertension Mother    Alcoholism Father     Social History Social History   Tobacco Use   Smoking status: Never   Smokeless tobacco: Never  Vaping Use   Vaping Use: Never used  Substance Use Topics   Alcohol use: No   Drug use:  No     Allergies   Lisinopril and Banana   Review of Systems Review of Systems  Constitutional:  Negative for fatigue and fever.  HENT:  Negative for sore throat.   Respiratory:  Negative for cough and shortness of breath.   Cardiovascular:  Negative for chest pain.  Gastrointestinal:  Positive for abdominal pain. Negative for abdominal distention, diarrhea, nausea and vomiting.  Genitourinary:  Negative for flank pain.  Musculoskeletal:  Negative for back pain and gait problem.  Neurological:  Negative for dizziness, weakness and light-headedness.     Physical Exam Triage Vital Signs ED Triage Vitals  Enc Vitals Group     BP 03/31/22 1409 (!) 149/100     Pulse Rate 03/31/22 1409 90     Resp 03/31/22 1409 18     Temp 03/31/22 1409 97.7 F (36.5 C)     Temp Source 03/31/22 1409 Oral     SpO2 03/31/22 1409 95 %     Weight --      Height --      Head Circumference --      Peak Flow --      Pain Score 03/31/22 1407 7     Pain Loc --      Pain Edu? --      Excl. in [place]? --    No data found.  Updated Vital Signs BP (!) 149/100 (BP Location: Left Arm)   Pulse 90   Temp 97.7 F (36.5 C) (Oral)   Resp 18   SpO2 95%   Visual Acuity Right Eye Distance:   Left Eye Distance:   Bilateral Distance:    Right Eye Near:   Left Eye Near:    Bilateral Near:     Physical Exam Vitals and nursing note reviewed.  Constitutional:      Appearance: Normal appearance.  HENT:     Head: Normocephalic and atraumatic.     Right Ear: External ear normal.     Left Ear: External ear normal.     Nose: Nose normal.     Mouth/Throat:     Mouth: Mucous membranes are moist.  Eyes:     Pupils: Pupils are equal, round, and reactive to light.  Cardiovascular:     Rate and Rhythm: Normal rate and regular rhythm.     Pulses: Normal pulses.     Heart sounds: Normal heart sounds. No murmur heard. Pulmonary:     Effort: Pulmonary effort is normal. No respiratory distress.  Abdominal:      General: Abdomen is flat. Bowel sounds are normal.     Palpations: Abdomen is soft. There is no mass.     Tenderness: There is no abdominal tenderness. There is no guarding or rebound.     Hernia: No hernia is present.  Musculoskeletal:  General: No swelling. Normal range of motion.  Skin:    General: Skin is warm and dry.  Neurological:     Mental Status: He is alert.      UC Treatments / Results  Labs (all labs ordered are listed, but only abnormal results are displayed) Labs Reviewed  CBC WITH DIFFERENTIAL/PLATELET  COMPREHENSIVE METABOLIC PANEL  LIPASE, BLOOD    EKG   Radiology No results found.  Procedures Procedures (including critical care time)  Medications Ordered in UC Medications  alum & mag hydroxide-simeth (MAALOX/MYLANTA) 200-200-20 MG/5ML suspension 30 mL (30 mLs Oral Given 03/31/22 1451)  lidocaine (XYLOCAINE) 2 % viscous mouth solution 15 mL (15 mLs Mouth/Throat Given 03/31/22 1451)    Initial Impression / Assessment and Plan / UC Course  I have reviewed the triage vital signs and the nursing notes.  Pertinent labs & imaging results that were available during my care of the patient were reviewed by me and considered in my medical decision making (see chart for details).  Vitals and triage reviewed, patient is hemodynamically stable.  Epigastric pain with eating and deep breathing. GI cocktail trailed in clinic, pain with deep breathing remains, potential slight improvement. Normal lipase on 02/23/2022 Abdomen soft, minor left upper quadrant tenderness with deep palpation.  Without rebound, guarding or mass. EKG my interpretation, SR w/ occasional PVCs, w/o STE or STD, rate of 88 bpm. Without chest pain, SOB or other concerning cardiac symptoms. Does not drink ETOH or use drugs, will obtain CBC, CMP and lipase to ensure no abnormalities from baseline, will call if labs are concerning and warrant further evaluation.  Suspect gastritis, will trial  Tums, Pepcid, and Carafate.  Follow-up and return precautions discussed, patient verbalized understanding.  No questions at this time.    Final Clinical Impressions(s) / UC Diagnoses   Final diagnoses:  Abdominal pain, left upper quadrant     Discharge Instructions      Your EKG was normal, I have low suspicion for a heart attack or cardiac event.  I suspect your symptoms could be related to gastritis, irritation of the stomach wall.  We are going to start you on some antacids to help treat acid reflux and overall decrease your stomach acid, to see if this helps your pain.  Please avoid caffeine, chocolate, spicy foods, food with high fat content, carbonated beverages, and peppermint.  You can take Tums as needed for abdominal discomfort.  Please eat small meals, as this may help as well.   Please seek immediate care if you develop chest pain, shortness of breath, nausea, vomiting, fever or worsening of condition.      ED Prescriptions     Medication Sig Dispense Auth. Provider   sucralfate (CARAFATE) 1 g tablet Take 1 tablet (1 g total) by mouth 4 (four) times daily -  with meals and at bedtime. 120 tablet Louretta Shorten, [place] N, [place]   famotidine (PEPCID) 20 MG tablet  (Status: Discontinued) Take 1 tablet (20 mg total) by mouth 2 (two) times daily. 30 tablet Louretta Shorten, [place] N, [place]   famotidine (PEPCID) 20 MG tablet Take 1 tablet (20 mg total) by mouth daily. 30 tablet Akeila Lana, [place] N, [place]      PDMP not reviewed this encounter.   Talmage Teaster, [place] N, [place] 03/31/22 [phone]

## 2022-04-06 ENCOUNTER — Other Ambulatory Visit: Payer: Self-pay

## 2022-05-02 ENCOUNTER — Other Ambulatory Visit: Payer: Self-pay | Admitting: Pharmacist

## 2022-05-02 NOTE — Progress Notes (Signed)
Patient outreached by Michiel Cowboy, PharmD Candidate on 05/02/2022 to discuss hypertension.   Patient does not have an automated home blood pressure machine.   Medication review was performed. They are not taking medications as prescribed. Differences from their prescribed list include: patient is not taking any medications ~2 days per week. He states he does this intentionally to avoid over-medication, though he notes that he generally feels better on the days that he takes his medications. Patient was advised to take medications every day and to obtain an ambulatory blood pressure monitor so that the patient can keep track of the efficacy of his antihypertensive regimen.   The following barriers to adherence were noted:  - They do not have cost concerns. Does note that he was "behind" on payments some weeks ago, but says this is unusual for him and cost is generally not a barrier for him from a medicine standpoint.  - They do not have transportation concerns.  - They do not need assistance obtaining refills.  - They do not occasionally forget to take some of their prescribed medications. Patient intentionally misses doses of all of his medications ~2 days per week.  - They do not feel like one/some of their medications make them feel poorly.  - They do not have questions or concerns about their medications.  - They do not have follow up scheduled with their primary care provider/cardiologist.   The following interventions were completed:  - Medications were reviewed  - Patient was educated on goal blood pressures and long term health implications of elevated blood pressure  - Patient was educated on how to access home blood pressure machine  - Patient was counseled on lifestyle modifications to improve blood pressure, including dietary improvements (minimizing intake of salt, caffeine, and processed foods) and increased physical activity.   The patient does not have follow-up scheduled. Last PCP  note from 02/2022 stated 4-week follow-up but it seems this never took place. Patient was advised to schedule follow-up with PCP at earliest convenience.   Michiel Cowboy, PharmD Candidate   Will notify office staff to outreach patient to schedule.   Catie Eppie Gibson, PharmD, BCACP, CPP [facility] [phone]

## 2022-05-25 ENCOUNTER — Other Ambulatory Visit: Payer: Self-pay

## 2022-05-25 NOTE — Progress Notes (Signed)
   Lonnie Brown [birth year] [id number]  Patient outreached by Bing Plume, PharmD Candidate on 05/24/2022 while they were picking up prescriptions at [facility].  Blood Pressure Readings: Does the patient have a validated home blood pressure machine?: No Patient unable to report home BP readings.   Medication review was performed. I noticed that patient may have not be adherent based on his fill history. I asked the patient about his current home supply and patient was forthcoming and admits that he has not been the best lately when it comes to keeping up with his medications and getting refills.  Patient states that out of all his medications, he is sure that he is taking his metformin. Patient is unsure when the last time he took his BP medications, stating that he probably has not taken them for around a week. Fill history suggests patient may not be adherent to his BP medications for 1-2 months.   The following barriers to adherence were ed: Does the patient have cost concerns?: No Does the patient have transportation concerns?: No Does the patient need assistance obtaining refills?: Yes Does the patient occassionally forget to take some of their prescribed medications?: Yes Does the patient feel like one/some of their medications make them feel poorly?: No Does the patient have questions or concerns about their medications?: No Does the patient have a follow up scheduled with their primary care provider/cardiologist?: Yes   Interventions: Interventions Completed: Medications were reviewed, Patient was educated on goal blood pressures and long term health implications of elevated blood pressureDiscussed how to obtain BP monitor (patient is uninsured and patient was provided information on how to purchase a BP monitor from pharmacy).   The patient has follow up scheduled:  PCP: Marcine Matar, MD   Winnifred Friar, Student-PharmD

## 2022-05-29 ENCOUNTER — Other Ambulatory Visit: Payer: Self-pay

## 2022-05-29 ENCOUNTER — Ambulatory Visit: Payer: Self-pay | Attending: Internal Medicine | Admitting: Internal Medicine

## 2022-05-29 ENCOUNTER — Encounter: Payer: Self-pay | Admitting: Internal Medicine

## 2022-05-29 DIAGNOSIS — N1831 Chronic kidney disease, stage 3a: Secondary | ICD-10-CM

## 2022-05-29 DIAGNOSIS — Z7984 Long term (current) use of oral hypoglycemic drugs: Secondary | ICD-10-CM

## 2022-05-29 DIAGNOSIS — E785 Hyperlipidemia, unspecified: Secondary | ICD-10-CM

## 2022-05-29 DIAGNOSIS — R29818 Other symptoms and signs involving the nervous system: Secondary | ICD-10-CM

## 2022-05-29 DIAGNOSIS — E1159 Type 2 diabetes mellitus with other circulatory complications: Secondary | ICD-10-CM

## 2022-05-29 DIAGNOSIS — E1169 Type 2 diabetes mellitus with other specified complication: Secondary | ICD-10-CM

## 2022-05-29 DIAGNOSIS — Z1211 Encounter for screening for malignant neoplasm of colon: Secondary | ICD-10-CM

## 2022-05-29 DIAGNOSIS — Z125 Encounter for screening for malignant neoplasm of prostate: Secondary | ICD-10-CM

## 2022-05-29 DIAGNOSIS — I152 Hypertension secondary to endocrine disorders: Secondary | ICD-10-CM

## 2022-05-29 LAB — POCT GLYCOSYLATED HEMOGLOBIN (HGB A1C): HbA1c, POC (controlled diabetic range): 6.8 % (ref 0.0–7.0)

## 2022-05-29 LAB — GLUCOSE, POCT (MANUAL RESULT ENTRY): POC Glucose: 147 mg/dl — AB (ref 70–99)

## 2022-05-29 NOTE — Progress Notes (Signed)
DM Snoring Medication refills on all medications

## 2022-05-29 NOTE — Progress Notes (Signed)
Patient ID: Lonnie Brown, male    DOB: December 09, 1960  MRN: [id number]  CC: Snoring   Subjective: Lonnie Brown is a 62 y.o. male who presents for chronic ds management His concerns today include:  History of HTN, CKD stage 3, obesity, HL, DM, ED, TIA, Cerebellar artery occlusion, thyroid nodule    DM: Results for orders placed or performed in visit on 05/29/22  HgB A1c  Result Value Ref Range   Hemoglobin A1C     HbA1c POC (<> result, manual entry)     HbA1c, POC (prediabetic range)     HbA1c, POC (controlled diabetic range) 6.8 0.0 - 7.0 %  Glucose (CBG)  Result Value Ref Range   POC Glucose 147 (A) 70 - 99 mg/dl  Should be on metformin 1 g twice a day.  Taking Metformin about 3x/wk.  Feels he does not need to take every day.  Last pick up rxn 01/2022 for 3 mth supply Not doing well with eating habits.  Tends to overeat. Not much exercise  HTN: should be on HCTZ 25 mg , Spironolactone 25 mg (started 12/2021 by NP at [facility]) and Norvasc 10 mg daily.  Not taking meds consistently.  Has a pill box but not using consistently Denies any chest pains or lower extremity edema. -We have been keeping an eye on his kidney function.  Most recent GFR was 55.  Not on NSAIDs.  HL:  taking Lipitor but not consistently.  Denies any side effects from the medication.  Reports that he snores loud, has witness apnea by wife, tired during the day.  No morning HA  HM: Due for diabetic eye exam.  He is uninsured.  Due for colon cancer screening.  He is agreeable to PSA for prostate cancer screening. Patient Active Problem List   Diagnosis Date Noted   Need for immunization against influenza 01/04/2022   TIA (transient ischemic attack) 02/06/2019   Cerebellar artery occlusion or stenosis 02/06/2019   Thyroid enlarged 02/06/2019   CVA (cerebral vascular accident) (HCC) 01/20/2019   Hyperlipidemia associated with type 2 diabetes mellitus (HCC) 12/02/2018   Chronic midline low back pain  without sciatica 09/02/2018   Controlled type 2 diabetes mellitus without complication, without long-term current use of insulin (HCC) 03/01/2018   Erectile dysfunction 03/01/2018   Noncompliance with medications 03/01/2018   CKD (chronic kidney disease) stage 3, GFR 30-59 ml/min (HCC) 08/27/2017   Hyperlipidemia 09/25/2016   Morbid obesity (HCC) 07/20/2015   Achilles tendinitis 04/09/2015   Hypertension 03/04/2015     Current Outpatient Medications on File Prior to Visit  Medication Sig Dispense Refill   amLODipine (NORVASC) 10 MG tablet TAKE 1 TABLET (10 MG TOTAL) BY MOUTH DAILY. 90 tablet 4   aspirin 81 MG EC tablet Take 1 tablet (81 mg total) by mouth daily. 90 tablet 1   atorvastatin (LIPITOR) 80 MG tablet TAKE 1 TABLET (80 MG TOTAL) BY MOUTH DAILY AT 6 PM. 30 tablet 5   hydrochlorothiazide (HYDRODIURIL) 25 MG tablet Take 1 tablet (25 mg total) by mouth daily. 90 tablet 3   metFORMIN (GLUCOPHAGE) 1000 MG tablet Take 1 tablet (1,000 mg total) by mouth 2 (two) times daily with a meal. 180 tablet 1   sildenafil (VIAGRA) 50 MG tablet Take 1 tab by mouth half hour to 1 hour before sexual intercourse.  Limit to 1 tablet per 24-hour. 10 tablet 5   spironolactone (ALDACTONE) 25 MG tablet Take 1 tablet (25 mg total) by  mouth daily. 90 tablet 0   famotidine (PEPCID) 20 MG tablet Take 1 tablet (20 mg total) by mouth daily. 30 tablet 0   No current facility-administered medications on file prior to visit.    Allergies  Allergen Reactions   Lisinopril Other (See Comments)    Angioedema   Banana Nausea And Vomiting    Social History   Socioeconomic History   Marital status: Married    Spouse name: Not on file   Number of children: Not on file   Years of education: Not on file   Highest education level: Not on file  Occupational History   Not on file  Tobacco Use   Smoking status: Never   Smokeless tobacco: Never  Vaping Use   Vaping Use: Never used  Substance and Sexual Activity    Alcohol use: No   Drug use: No   Sexual activity: Yes    Birth control/protection: None  Other Topics Concern   Not on file  Social History Narrative   Not on file   Social Determinants of Health   Financial Resource Strain: Not on file  Food Insecurity: Not on file  Transportation Needs: Not on file  Physical Activity: Not on file  Stress: Not on file  Social Connections: Not on file  Intimate Partner Violence: Not on file    Family History  Problem Relation Age of Onset   Diabetes Mother    Hypertension Mother    Alcoholism Father     Past Surgical History:  Procedure Laterality Date   EYE SURGERY     TONSILLECTOMY      ROS: Review of Systems Negative except as stated above  PHYSICAL EXAM: BP (!) 160/90 (BP Location: Right Arm, Patient Position: Sitting, Cuff Size: Normal)   Pulse 74   Temp 98.3 F (36.8 C) (Oral)   Resp 20   Ht 5\' 11"  (1.803 m)   Wt (!) 310 lb (140.6 kg)   SpO2 98%   BMI 43.24 kg/m   Wt Readings from Last 3 Encounters:  05/29/22 (!) 310 lb (140.6 kg)  02/23/22 (!) 314 lb (142.4 kg)  02/20/22 (!) 312 lb 6.4 oz (141.7 kg)    Physical Exam   General appearance - alert, well appearing, morbidly obese older African-American male and in no distress Mental status - normal mood, behavior, speech, dress, motor activity, and thought processes Neck - supple, no significant adenopathy Chest - clear to auscultation, no wheezes, rales or rhonchi, symmetric air entry Heart - normal rate, regular rhythm, normal S1, S2, no murmurs, rubs, clicks or gallops Extremities - peripheral pulses normal, no pedal edema, no clubbing or cyanosis     Latest Ref Rng & Units 03/31/2022    3:41 PM 02/23/2022    1:41 AM 02/20/2022   10:55 AM  CMP  Glucose 70 - 99 mg/dL 161  096  045   BUN 8 - 23 mg/dL 21  20  15    Creatinine 0.61 - 1.24 mg/dL 4.09  8.11  9.14   Sodium 135 - 145 mmol/L 138  129  137   Potassium 3.5 - 5.1 mmol/L 4.0  4.2  4.4   Chloride 98 -  111 mmol/L 96  99  102   CO2 22 - 32 mmol/L 26  20  24    Calcium 8.9 - 10.3 mg/dL 9.9  8.6  9.2   Total Protein 6.5 - 8.1 g/dL 8.1  7.9    Total Bilirubin 0.3 - 1.2 mg/dL  1.4  1.2    Alkaline Phos 38 - 126 U/L 82  75    AST 15 - 41 U/L 29  31    ALT 0 - 44 U/L 36  45     Lipid Panel     Component Value Date/Time   CHOL 184 08/10/2020 1559   TRIG 170 (H) 08/10/2020 1559   HDL 33 (L) 08/10/2020 1559   CHOLHDL 5.6 (H) 08/10/2020 1559   CHOLHDL 4.3 01/20/2019 1330   VLDL 14 01/20/2019 1330   LDLCALC 121 (H) 08/10/2020 1559    CBC    Component Value Date/Time   WBC 9.0 03/31/2022 1541   RBC 5.25 03/31/2022 1541   HGB 15.4 03/31/2022 1541   HGB 14.3 08/10/2020 1559   HCT 46.1 03/31/2022 1541   HCT 43.6 08/10/2020 1559   PLT 305 03/31/2022 1541   PLT 283 08/10/2020 1559   MCV 87.8 03/31/2022 1541   MCV 88 08/10/2020 1559   MCH 29.3 03/31/2022 1541   MCHC 33.4 03/31/2022 1541   RDW 13.0 03/31/2022 1541   RDW 14.1 08/10/2020 1559   LYMPHSABS 2.6 03/31/2022 1541   MONOABS 0.8 03/31/2022 1541   EOSABS 0.1 03/31/2022 1541   BASOSABS 0.1 03/31/2022 1541    ASSESSMENT AND PLAN: 1. Type 2 diabetes mellitus with morbid obesity (HCC) At goal. Encourage medication adherence. Discussed and encouraged healthy eating habits and trying to move more. - HgB A1c - Glucose (CBG) - Microalbumin / creatinine urine ratio  2. Hyperlipidemia associated with type 2 diabetes mellitus (HCC) Medication nonadherence.  Encouraged him to take the atorvastatin to help decrease risks of cardiovascular events. - Lipid panel  3. Stage 3a chronic kidney disease (HCC) GFR improved when last checked.  Continue to avoid NSAIDs.  Discussed importance of good blood pressure and diabetes control.  4. Hypertension associated with type 2 diabetes mellitus (HCC) Not at goal due to nonadherence to medications.  Discussed health risks associated with uncontrolled blood pressure.  Encouraged him to take his  medicines daily as prescribed which are Norvasc, HCTZ and spironolactone.  Follow-up with clinical pharmacist in about 3 weeks for repeat blood pressure check.  5. Screening for colon cancer - Fecal occult blood, imunochemical([facility]/Sun[facility])  6. Prostate cancer screening - PSA  7. Suspected sleep apnea Advised patient to apply for Medicaid.  Once approved he should let me know so that we can refer for sleep study.    Patient was given the opportunity to ask questions.  Patient verbalized understanding of the plan and was able to repeat key elements of the plan.   This documentation was completed using Paediatric nurse.  Any transcriptional errors are unintentional.  Orders Placed This Encounter  Procedures   Fecal occult blood, imunochemical([facility]/Sunquest)   PSA   Lipid panel   Microalbumin / creatinine urine ratio   HgB A1c   Glucose (CBG)     Requested Prescriptions    No prescriptions requested or ordered in this encounter    Return in about 4 months (around 09/29/2022) for Appt with [facility] in 3-4 wks for BP check.  Jonah Blue, MD, FACP

## 2022-05-30 LAB — LIPID PANEL
Chol/HDL Ratio: 6.4 ratio — ABNORMAL HIGH (ref 0.0–5.0)
Cholesterol, Total: 191 mg/dL (ref 100–199)
HDL: 30 mg/dL — ABNORMAL LOW (ref >39)
LDL Chol Calc (NIH): 127 mg/dL — ABNORMAL HIGH (ref 0–99)
Triglycerides: 192 mg/dL — ABNORMAL HIGH (ref 0–149)
VLDL Cholesterol Cal: 34 mg/dL (ref 5–40)

## 2022-05-30 LAB — MICROALBUMIN / CREATININE URINE RATIO
Creatinine, Urine: 52.9 mg/dL
Microalb/Creat Ratio: 11 mg/g creat (ref 0–29)
Microalbumin, Urine: 6 ug/mL

## 2022-05-30 LAB — PSA: Prostate Specific Ag, Serum: 2.3 ng/mL (ref 0.0–4.0)

## 2022-06-06 ENCOUNTER — Other Ambulatory Visit: Payer: Self-pay

## 2022-06-08 ENCOUNTER — Other Ambulatory Visit: Payer: Self-pay

## 2022-06-19 ENCOUNTER — Encounter: Payer: Self-pay | Admitting: Pharmacist

## 2022-06-19 ENCOUNTER — Ambulatory Visit: Payer: Self-pay | Attending: Family Medicine | Admitting: Pharmacist

## 2022-06-19 VITALS — BP 132/80 | HR 78

## 2022-06-19 DIAGNOSIS — Z713 Dietary counseling and surveillance: Secondary | ICD-10-CM | POA: Insufficient documentation

## 2022-06-19 DIAGNOSIS — E1122 Type 2 diabetes mellitus with diabetic chronic kidney disease: Secondary | ICD-10-CM | POA: Insufficient documentation

## 2022-06-19 DIAGNOSIS — Z8673 Personal history of transient ischemic attack (TIA), and cerebral infarction without residual deficits: Secondary | ICD-10-CM | POA: Insufficient documentation

## 2022-06-19 DIAGNOSIS — I152 Hypertension secondary to endocrine disorders: Secondary | ICD-10-CM

## 2022-06-19 DIAGNOSIS — Z7984 Long term (current) use of oral hypoglycemic drugs: Secondary | ICD-10-CM

## 2022-06-19 DIAGNOSIS — E669 Obesity, unspecified: Secondary | ICD-10-CM | POA: Insufficient documentation

## 2022-06-19 DIAGNOSIS — I129 Hypertensive chronic kidney disease with stage 1 through stage 4 chronic kidney disease, or unspecified chronic kidney disease: Secondary | ICD-10-CM | POA: Insufficient documentation

## 2022-06-19 DIAGNOSIS — N183 Chronic kidney disease, stage 3 unspecified: Secondary | ICD-10-CM | POA: Insufficient documentation

## 2022-06-19 DIAGNOSIS — E1159 Type 2 diabetes mellitus with other circulatory complications: Secondary | ICD-10-CM

## 2022-06-19 NOTE — Progress Notes (Signed)
   S:    PCP: Dr. Laural Benes  62 y.o. male who presents for hypertension evaluation, education, and management.  PMH is significant for HTN, CKD stage 3, obesity, HLD, DM, TIA, cerebellar artery occlusion, thyroid nodule. Patient was referred and last seen by Primary Care Provider, Dr. Laural Benes, on 05/28/2021.   At last visit, BP was 155/95 mmHg and 160/90 mmHg. Patient admitted that he was not taking medication consistently and he was instructed to take as prescribed and follow up in 3 weeks for BP check.    Today, patient arrives in good spirits and presents without assistance. Denies dizziness, headache, blurred vision, swelling. Patient reports he has been taking his medications consistently. However, he does not check his BP at home.   Patient reports hypertension was diagnosed at least 7 years ago.   Medication adherence reported. Patient has taken BP medications today.   Current antihypertensives include: HCTZ 25 mg daily, spironolactone 25 mg daily, amlodipine 10 mg daily  Previously used antihypertensives: lisinopril-HCTZ (DC'd d/t angioedema)   Reported home BP readings: not checking at home  Insurance: Medicaid family planning.   O:  ROS  Physical Exam  Last 3 Office BP readings: BP Readings from Last 3 Encounters:  05/29/22 (!) 160/90  03/31/22 (!) 149/100  02/23/22 (!) 149/102    BMET    Component Value Date/Time   NA 138 03/31/2022 1541   NA 137 02/20/2022 1055   K 4.0 03/31/2022 1541   CL 96 (L) 03/31/2022 1541   CO2 26 03/31/2022 1541   GLUCOSE 154 (H) 03/31/2022 1541   BUN 21 03/31/2022 1541   BUN 15 02/20/2022 1055   CREATININE 1.44 (H) 03/31/2022 1541   CALCIUM 9.9 03/31/2022 1541   GFRNONAA 55 (L) 03/31/2022 1541   GFRAA 63 11/20/2019 1618    Renal function: CrCl cannot be calculated (Patient's most recent lab result is older than the maximum 21 days allowed.).  Clinical ASCVD: Yes  The ASCVD Risk score (Arnett DK, et al., 2019) failed to  calculate for the following reasons:   The patient has a prior MI or stroke diagnosis   A/P: Hypertension longstanding, currently at goal on current medications. BP goal <130/80 mmHg. It may also be reasonable to have a SBP goal of <120 given CKD. Will defer changes today as patient is not checking BP at home. Medication adherence appears appropriate. -Continued HCTZ 25 mg daily, spironolactone 25 mg daily, amlodipine 10 mg daily -Counseled on lifestyle modifications for blood pressure control including reduced dietary sodium, increased exercise, adequate sleep. -Encouraged patient to check BP at home and bring log of readings to next visit. Counseled on proper use of home BP cuff.   Results reviewed and written information provided.    Written patient instructions provided. Patient verbalized understanding of treatment plan.  Total time in face to face counseling 30 minutes.    Follow-up:  Pharmacist PRN. PCP clinic visit in September 2024.   Valeda Malm, Pharm.D. PGY-2 Ambulatory Care Pharmacy Resident

## 2022-08-18 ENCOUNTER — Other Ambulatory Visit: Payer: Self-pay | Admitting: Internal Medicine

## 2022-08-18 DIAGNOSIS — N529 Male erectile dysfunction, unspecified: Secondary | ICD-10-CM

## 2022-08-18 DIAGNOSIS — I663 Occlusion and stenosis of cerebellar arteries: Secondary | ICD-10-CM

## 2022-08-18 DIAGNOSIS — I10 Essential (primary) hypertension: Secondary | ICD-10-CM

## 2022-08-18 NOTE — Telephone Encounter (Signed)
Medication Refill - Medication: Rx #: [id number]  atorvastatin (LIPITOR) 80 MG tablet [[id number]]   Rx #: [id number]  hydrochlorothiazide (HYDRODIURIL) 25 MG tablet [[id number]]   Rx #: [id number]  sildenafil (VIAGRA) 50 MG tablet [[id number]]    Has the patient contacted their pharmacy? Yes.   (Agent: If no, request that the patient contact the pharmacy for the refill. If patient does not wish to contact the pharmacy document the reason why and proceed with request.) (Agent: If yes, when and what did the pharmacy advise?)  Preferred Pharmacy (with phone number or street name):  [facility] Phone: [phone]  Fax: [phone]     Has the patient been seen for an appointment in the last year OR does the patient have an upcoming appointment? Yes.    Agent: Please be advised that RX refills may take up to 3 business days. We ask that you follow-up with your pharmacy.

## 2022-08-21 ENCOUNTER — Other Ambulatory Visit: Payer: Self-pay

## 2022-08-21 ENCOUNTER — Other Ambulatory Visit: Payer: Self-pay | Admitting: Internal Medicine

## 2022-08-21 DIAGNOSIS — I10 Essential (primary) hypertension: Secondary | ICD-10-CM

## 2022-08-21 MED ORDER — HYDROCHLOROTHIAZIDE 25 MG PO TABS
25.0000 mg | ORAL_TABLET | Freq: Every day | ORAL | 0 refills | Status: DC
  Filled 2022-08-21 – 2022-09-22 (×2): qty 90, 90d supply, fill #0

## 2022-08-21 MED ORDER — ATORVASTATIN CALCIUM 80 MG PO TABS
80.0000 mg | ORAL_TABLET | Freq: Every day | ORAL | 0 refills | Status: DC
  Filled 2022-08-21 – 2022-09-22 (×2): qty 90, 90d supply, fill #0

## 2022-08-21 MED ORDER — SILDENAFIL CITRATE 50 MG PO TABS
ORAL_TABLET | ORAL | 0 refills | Status: AC
Start: 2022-08-21 — End: ?
  Filled 2022-08-21: qty 10, 30d supply, fill #0
  Filled 2022-09-22: qty 10, 10d supply, fill #0

## 2022-08-21 NOTE — Telephone Encounter (Signed)
Requested Prescriptions  Pending Prescriptions Disp Refills   atorvastatin (LIPITOR) 80 MG tablet 90 tablet 0    Sig: Take 1 tablet (80 mg total) by mouth daily at 6 PM.     Cardiovascular:  Antilipid - Statins Failed - 08/18/2022  1:15 PM      Failed - Lipid Panel in normal range within the last 12 months    Cholesterol, Total  Date Value Ref Range Status  05/29/2022 191 100 - 199 mg/dL Final   LDL Chol Calc (NIH)  Date Value Ref Range Status  05/29/2022 127 (H) 0 - 99 mg/dL Final   HDL  Date Value Ref Range Status  05/29/2022 30 (L) >39 mg/dL Final   Triglycerides  Date Value Ref Range Status  05/29/2022 192 (H) 0 - 149 mg/dL Final         Passed - Patient is not pregnant      Passed - Valid encounter within last 12 months    Recent Outpatient Visits           2 months ago Hypertension associated with type 2 diabetes mellitus [facility])   [place] [facility] [place], [place] L, RPH-CPP   2 months ago Type 2 diabetes mellitus with morbid obesity (HCC)   [place] [facility] Jonah Blue B, MD   1 year ago Type 2 diabetes mellitus with morbid obesity [facility])   [place] [facility] Jonah Blue B, MD   2 years ago Type 2 diabetes mellitus with morbid obesity [facility])   [place] [facility] Marcine Matar, MD   2 years ago Need for influenza vaccination   [facility] Drucilla Chalet, RPH-CPP       Future Appointments             In 1 month Marcine Matar, MD [facility]             hydrochlorothiazide (HYDRODIURIL) 25 MG tablet 90 tablet 0    Sig: Take 1 tablet (25 mg total) by mouth daily.     Cardiovascular: Diuretics - Thiazide Failed - 08/18/2022  1:15 PM      Failed - Cr in normal range and within 180 days    Creatinine, Ser  Date Value Ref Range Status  03/31/2022  1.44 (H) 0.61 - 1.24 mg/dL Final         Passed - K in normal range and within 180 days    Potassium  Date Value Ref Range Status  03/31/2022 4.0 3.5 - 5.1 mmol/L Final         Passed - Na in normal range and within 180 days    Sodium  Date Value Ref Range Status  03/31/2022 138 135 - 145 mmol/L Final  02/20/2022 137 134 - 144 mmol/L Final         Passed - Last BP in normal range    BP Readings from Last 1 Encounters:  06/19/22 132/80         Passed - Valid encounter within last 6 months    Recent Outpatient Visits           2 months ago Hypertension associated with type 2 diabetes mellitus [facility])   [facility] [place], Ke[place] L, RPH-CPP   2 months ago Type 2 diabetes mellitus with morbid  obesity [facility])   [place] [facility] Jonah Blue B, MD   1 year ago Type 2 diabetes mellitus with morbid obesity [facility])   [facility] Jonah Blue B, MD   2 years ago Type 2 diabetes mellitus with morbid obesity [facility])   [place] [facility] Marcine Matar, MD   2 years ago Need for influenza vaccination   [place] Drucilla Chalet, RPH-CPP       Future Appointments             In 1 month Marcine Matar, MD [place] [facility]             sildenafil (VIAGRA) 50 MG tablet 10 tablet 0    Sig: Take 1 tab by mouth half hour to 1 hour before sexual intercourse.  Limit to 1 tablet per 24-hour.     Urology: Erectile Dysfunction Agents Passed - 08/18/2022  1:15 PM      Passed - AST in normal range and within 360 days    AST  Date Value Ref Range Status  03/31/2022 29 15 - 41 U/L Final         Passed - ALT in normal range and within 360 days    ALT  Date Value Ref Range Status  03/31/2022 36 0 - 44 U/L Final         Passed - Last BP in normal range    BP Readings from  Last 1 Encounters:  06/19/22 132/80         Passed - Valid encounter within last 12 months    Recent Outpatient Visits           2 months ago Hypertension associated with type 2 diabetes mellitus [facility])   [place] [facility] [place], [place] L, RPH-CPP   2 months ago Type 2 diabetes mellitus with morbid obesity [facility])   [place] [facility] Jonah Blue B, MD   1 year ago Type 2 diabetes mellitus with morbid obesity [facility])   [place] [facility] Jonah Blue B, MD   2 years ago Type 2 diabetes mellitus with morbid obesity [facility])    [facility] Marcine Matar, MD   2 years ago Need for influenza vaccination   [facility] [place] [facility] Drucilla Chalet, RPH-CPP       Future Appointments             In 1 month Laural Benes, Binnie Rail, MD [facility]

## 2022-08-22 ENCOUNTER — Other Ambulatory Visit: Payer: Self-pay

## 2022-08-22 MED ORDER — AMLODIPINE BESYLATE 10 MG PO TABS
10.0000 mg | ORAL_TABLET | Freq: Every day | ORAL | 1 refills | Status: DC
Start: 2022-08-22 — End: 2023-07-24
  Filled 2022-08-22 – 2022-09-22 (×2): qty 90, 90d supply, fill #0
  Filled 2023-06-19: qty 90, 90d supply, fill #1

## 2022-08-24 ENCOUNTER — Other Ambulatory Visit: Payer: Self-pay

## 2022-09-01 ENCOUNTER — Other Ambulatory Visit: Payer: Self-pay

## 2022-09-22 ENCOUNTER — Other Ambulatory Visit: Payer: Self-pay

## 2022-09-29 ENCOUNTER — Encounter: Payer: Self-pay | Admitting: Internal Medicine

## 2022-09-29 ENCOUNTER — Ambulatory Visit: Payer: Self-pay | Attending: Internal Medicine | Admitting: Internal Medicine

## 2022-09-29 DIAGNOSIS — E785 Hyperlipidemia, unspecified: Secondary | ICD-10-CM | POA: Insufficient documentation

## 2022-09-29 DIAGNOSIS — R600 Localized edema: Secondary | ICD-10-CM | POA: Insufficient documentation

## 2022-09-29 DIAGNOSIS — I129 Hypertensive chronic kidney disease with stage 1 through stage 4 chronic kidney disease, or unspecified chronic kidney disease: Secondary | ICD-10-CM | POA: Insufficient documentation

## 2022-09-29 DIAGNOSIS — E041 Nontoxic single thyroid nodule: Secondary | ICD-10-CM | POA: Insufficient documentation

## 2022-09-29 DIAGNOSIS — I152 Hypertension secondary to endocrine disorders: Secondary | ICD-10-CM

## 2022-09-29 DIAGNOSIS — Z8673 Personal history of transient ischemic attack (TIA), and cerebral infarction without residual deficits: Secondary | ICD-10-CM | POA: Insufficient documentation

## 2022-09-29 DIAGNOSIS — Z91199 Patient's noncompliance with other medical treatment and regimen due to unspecified reason: Secondary | ICD-10-CM

## 2022-09-29 DIAGNOSIS — Z8249 Family history of ischemic heart disease and other diseases of the circulatory system: Secondary | ICD-10-CM | POA: Insufficient documentation

## 2022-09-29 DIAGNOSIS — Z7984 Long term (current) use of oral hypoglycemic drugs: Secondary | ICD-10-CM

## 2022-09-29 DIAGNOSIS — Z833 Family history of diabetes mellitus: Secondary | ICD-10-CM | POA: Insufficient documentation

## 2022-09-29 DIAGNOSIS — E1122 Type 2 diabetes mellitus with diabetic chronic kidney disease: Secondary | ICD-10-CM | POA: Insufficient documentation

## 2022-09-29 DIAGNOSIS — Z6841 Body Mass Index (BMI) 40.0 and over, adult: Secondary | ICD-10-CM | POA: Insufficient documentation

## 2022-09-29 DIAGNOSIS — Z23 Encounter for immunization: Secondary | ICD-10-CM

## 2022-09-29 DIAGNOSIS — N183 Chronic kidney disease, stage 3 unspecified: Secondary | ICD-10-CM | POA: Insufficient documentation

## 2022-09-29 DIAGNOSIS — E1159 Type 2 diabetes mellitus with other circulatory complications: Secondary | ICD-10-CM

## 2022-09-29 DIAGNOSIS — Z91198 Patient's noncompliance with other medical treatment and regimen for other reason: Secondary | ICD-10-CM | POA: Insufficient documentation

## 2022-09-29 DIAGNOSIS — E1169 Type 2 diabetes mellitus with other specified complication: Secondary | ICD-10-CM

## 2022-09-29 DIAGNOSIS — Z1211 Encounter for screening for malignant neoplasm of colon: Secondary | ICD-10-CM

## 2022-09-29 LAB — POCT GLYCOSYLATED HEMOGLOBIN (HGB A1C): HbA1c, POC (controlled diabetic range): 7.8 % — AB (ref 0.0–7.0)

## 2022-09-29 LAB — GLUCOSE, POCT (MANUAL RESULT ENTRY): POC Glucose: 142 mg/dl — AB (ref 70–99)

## 2022-09-29 NOTE — Progress Notes (Signed)
Patient ID: Lonnie Brown, male    DOB: 1960-07-20  MRN: [id number]  CC: Diabetes (DM f/u. /No questions/ concerns/Flu vax adminsitered 09/29/22 - C.A./ Instructed to sign ROI for colonoscopy.)   Subjective: Lonnie Brown is a 62 y.o. male who presents for chronic ds management. His concerns today include:  History of HTN, CKD stage 3, obesity, HL, DM, ED, TIA, Cerebellar artery occlusion, thyroid nodule   DM: Results for orders placed or performed in visit on 09/29/22  POCT glycosylated hemoglobin (Hb A1C)  Result Value Ref Range   Hemoglobin A1C     HbA1c POC (<> result, manual entry)     HbA1c, POC (prediabetic range)     HbA1c, POC (controlled diabetic range) 7.8 (A) 0.0 - 7.0 %  POCT glucose (manual entry)  Result Value Ref Range   POC Glucose 142 (A) 70 - 99 mg/dl  Should be on Metformin 1 gram BID but taking once a day.  No significant S.E Admits eating habits not the best but eating less fried foods. Not much exercise Last LDL was 127.  Admits not taking Lipitor consistently.  Denies any side effects from the medication.  Patient with history of CVA in the past.  He tells me that he takes the aspirin daily. HTN: should be on HCTZ 25 mg , Spironolactone 25 mg  and Norvasc 10 mg daily. Admits that he miss taking the medicines several times a week.  He took them today.  Prior to today he had not taken them in 3 days.  Denies any side effects from the medications.  States that he has no excuse for why he does not take his medicines consistently. Has a pill box but not using consistently Denies any chest pains, SOB.  Some LE edema when he takes off socks.  On last visit he reported loud snoring and witnessed apnea by his wife.  Plan was to refer him for sleep study once he gets insurance.  Had advised him to go to the [facility] office on the fourth floor of this building to see if he qualifies.  He did not go.  He has family-planning Medicaid which is useless.    HM:  given  FIT on last visit.  Did not use Patient Active Problem List   Diagnosis Date Noted   Need for immunization against influenza 01/04/2022   TIA (transient ischemic attack) 02/06/2019   Cerebellar artery occlusion or stenosis 02/06/2019   Thyroid enlarged 02/06/2019   CVA (cerebral vascular accident) (HCC) 01/20/2019   Hyperlipidemia associated with type 2 diabetes mellitus (HCC) 12/02/2018   Chronic midline low back pain without sciatica 09/02/2018   Controlled type 2 diabetes mellitus without complication, without long-term current use of insulin (HCC) 03/01/2018   Erectile dysfunction 03/01/2018   Noncompliance with medications 03/01/2018   CKD (chronic kidney disease) stage 3, GFR 30-59 ml/min (HCC) 08/27/2017   Hyperlipidemia 09/25/2016   Morbid obesity (HCC) 07/20/2015   Achilles tendinitis 04/09/2015   Hypertension 03/04/2015     Current Outpatient Medications on File Prior to Visit  Medication Sig Dispense Refill   amLODipine (NORVASC) 10 MG tablet Take 1 tablet (10 mg total) by mouth daily. 90 tablet 1   aspirin 81 MG EC tablet Take 1 tablet (81 mg total) by mouth daily. 90 tablet 1   atorvastatin (LIPITOR) 80 MG tablet Take 1 tablet (80 mg total) by mouth daily at 6 PM. 90 tablet 0   hydrochlorothiazide (HYDRODIURIL) 25 MG tablet Take 1  tablet (25 mg total) by mouth daily. 90 tablet 0   metFORMIN (GLUCOPHAGE) 1000 MG tablet Take 1 tablet (1,000 mg total) by mouth 2 (two) times daily with a meal. 180 tablet 1   sildenafil (VIAGRA) 50 MG tablet Take 1 tab by mouth half hour to 1 hour before sexual intercourse.  Limit to 1 tablet per 24-hour. 10 tablet 0   spironolactone (ALDACTONE) 25 MG tablet Take 1 tablet (25 mg total) by mouth daily. 90 tablet 0   famotidine (PEPCID) 20 MG tablet Take 1 tablet (20 mg total) by mouth daily. 30 tablet 0   No current facility-administered medications on file prior to visit.    Allergies  Allergen Reactions   Lisinopril Other (See Comments)     Angioedema   Banana Nausea And Vomiting    Social History   Socioeconomic History   Marital status: Married    Spouse name: Not on file   Number of children: Not on file   Years of education: Not on file   Highest education level: Not on file  Occupational History   Not on file  Tobacco Use   Smoking status: Never   Smokeless tobacco: Never  Vaping Use   Vaping status: Never Used  Substance and Sexual Activity   Alcohol use: No   Drug use: No   Sexual activity: Yes    Birth control/protection: None  Other Topics Concern   Not on file  Social History Narrative   Not on file   Social Determinants of Health   Financial Resource Strain: Not on file  Food Insecurity: Not on file  Transportation Needs: Not on file  Physical Activity: Not on file  Stress: Not on file  Social Connections: Not on file  Intimate Partner Violence: Not on file    Family History  Problem Relation Age of Onset   Diabetes Mother    Hypertension Mother    Alcoholism Father     Past Surgical History:  Procedure Laterality Date   EYE SURGERY     TONSILLECTOMY      ROS: Review of Systems Negative except as stated above  PHYSICAL EXAM: BP (!) 156/93 (BP Location: Left Arm, Patient Position: Sitting, Cuff Size: Large)   Pulse 79   Temp 98.2 F (36.8 C) (Oral)   Ht 5\' 11"  (1.803 m)   Wt (!) 314 lb (142.4 kg)   SpO2 96%   BMI 43.79 kg/m   Physical Exam   General appearance - alert, well appearing, and in no distress Mental status - normal mood, behavior, speech, dress, motor activity, and thought processes Chest - clear to auscultation, no wheezes, rales or rhonchi, symmetric air entry Heart - normal rate, regular rhythm, normal S1, S2, no murmurs, rubs, clicks or gallops Extremities -trace to 1+ bilateral lower extremity edema.     Latest Ref Rng & Units 03/31/2022    3:41 PM 02/23/2022    1:41 AM 02/20/2022   10:55 AM  CMP  Glucose 70 - 99 mg/dL 403  474  259   BUN 8 -  23 mg/dL 21  20  15    Creatinine 0.61 - 1.24 mg/dL 5.63  8.75  6.43   Sodium 135 - 145 mmol/L 138  129  137   Potassium 3.5 - 5.1 mmol/L 4.0  4.2  4.4   Chloride 98 - 111 mmol/L 96  99  102   CO2 22 - 32 mmol/L 26  20  24    Calcium 8.9 -  10.3 mg/dL 9.9  8.6  9.2   Total Protein 6.5 - 8.1 g/dL 8.1  7.9    Total Bilirubin 0.3 - 1.2 mg/dL 1.4  1.2    Alkaline Phos 38 - 126 U/L 82  75    AST 15 - 41 U/L 29  31    ALT 0 - 44 U/L 36  45     Lipid Panel     Component Value Date/Time   CHOL 191 05/29/2022 1340   TRIG 192 (H) 05/29/2022 1340   HDL 30 (L) 05/29/2022 1340   CHOLHDL 6.4 (H) 05/29/2022 1340   CHOLHDL 4.3 01/20/2019 1330   VLDL 14 01/20/2019 1330   LDLCALC 127 (H) 05/29/2022 1340    CBC    Component Value Date/Time   WBC 9.0 03/31/2022 1541   RBC 5.25 03/31/2022 1541   HGB 15.4 03/31/2022 1541   HGB 14.3 08/10/2020 1559   HCT 46.1 03/31/2022 1541   HCT 43.6 08/10/2020 1559   PLT 305 03/31/2022 1541   PLT 283 08/10/2020 1559   MCV 87.8 03/31/2022 1541   MCV 88 08/10/2020 1559   MCH 29.3 03/31/2022 1541   MCHC 33.4 03/31/2022 1541   RDW 13.0 03/31/2022 1541   RDW 14.1 08/10/2020 1559   LYMPHSABS 2.6 03/31/2022 1541   MONOABS 0.8 03/31/2022 1541   EOSABS 0.1 03/31/2022 1541   BASOSABS 0.1 03/31/2022 1541    ASSESSMENT AND PLAN: 1. Type 2 diabetes mellitus with morbid obesity (HCC) A1c not at goal and has increased since last visit.  Strongly encourage patient to take the metformin twice a day as prescribed and not once a day.  He denies any GI side effects from the medication.  Discussed and encouraged healthy eating habits.  Discussed the importance of good diabetes control to prevent complications in the future. -Recommend getting diabetic eye exam when he can afford. - POCT glycosylated hemoglobin (Hb A1C) - POCT glucose (manual entry)  2. Hypertension associated with type 2 diabetes mellitus (HCC) Not at goal due to nonadherence with medications.  Discussed  risks of cardiovascular events due to uncontrolled blood pressure.  Encouraged him to take his medications as listed above.  Will have him follow-up with the clinical pharmacist in a few weeks. - Basic Metabolic Panel  3. Hyperlipidemia associated with type 2 diabetes mellitus (HCC) LDL not at goal.  Strongly encouraged him to take the atorvastatin.  4. Nonadherence to medical treatment He does not identify any financial difficulties with getting his medications.  He has a medication box.  Have encouraged him to use it to remind himself to take his medicines every day.  5. Encounter for immunization - Flu vaccine trivalent PF, 6mos and older(Flulaval,Afluria,Fluarix,Fluzone)  6. Screening for colon cancer - Fecal occult blood, imunochemical([facility])  Advised patient again to go upstairs to the Medicaid office in this building on the fourth floor to try to get approved for regular Medicaid.  Patient was given the opportunity to ask questions.  Patient verbalized understanding of the plan and was able to repeat key elements of the plan.   This documentation was completed using Paediatric nurse.  Any transcriptional errors are unintentional.  Orders Placed This Encounter  Procedures   Fecal occult blood, imunochemical([facility])   Flu vaccine trivalent PF, 6mos and older(Flulaval,Afluria,Fluarix,Fluzone)   Basic Metabolic Panel   POCT glycosylated hemoglobin (Hb A1C)   POCT glucose (manual entry)     Requested Prescriptions    No prescriptions requested or ordered  in this encounter    Return in about 4 months (around 01/29/2023) for Appt with [facility] in 2 wks for BP check.  Jonah Blue, MD, FACP

## 2022-09-30 LAB — BASIC METABOLIC PANEL
BUN/Creatinine Ratio: 13 (ref 10–24)
BUN: 18 mg/dL (ref 8–27)
CO2: 22 mmol/L (ref 20–29)
Calcium: 9.7 mg/dL (ref 8.6–10.2)
Chloride: 100 mmol/L (ref 96–106)
Creatinine, Ser: 1.43 mg/dL — ABNORMAL HIGH (ref 0.76–1.27)
Glucose: 125 mg/dL — ABNORMAL HIGH (ref 70–99)
Potassium: 4.6 mmol/L (ref 3.5–5.2)
Sodium: 138 mmol/L (ref 134–144)
eGFR: 55 mL/min/{1.73_m2} — ABNORMAL LOW (ref >59)

## 2022-10-01 LAB — FECAL OCCULT BLOOD, IMMUNOCHEMICAL: Fecal Occult Bld: NEGATIVE

## 2022-10-20 ENCOUNTER — Ambulatory Visit: Payer: Medicaid Other | Admitting: Pharmacist

## 2023-01-27 ENCOUNTER — Other Ambulatory Visit (HOSPITAL_BASED_OUTPATIENT_CLINIC_OR_DEPARTMENT_OTHER): Payer: Self-pay

## 2023-02-02 ENCOUNTER — Ambulatory Visit: Payer: Medicaid Other | Admitting: Internal Medicine

## 2023-02-17 ENCOUNTER — Other Ambulatory Visit: Payer: Self-pay

## 2023-02-17 ENCOUNTER — Encounter (HOSPITAL_COMMUNITY): Payer: Self-pay | Admitting: *Deleted

## 2023-02-17 ENCOUNTER — Emergency Department (HOSPITAL_COMMUNITY): Payer: Self-pay

## 2023-02-17 ENCOUNTER — Emergency Department (HOSPITAL_COMMUNITY)
Admission: EM | Admit: 2023-02-17 | Discharge: 2023-02-17 | Disposition: A | Discharge status: Home / Self Care | Payer: Self-pay | Attending: Emergency Medicine | Admitting: Emergency Medicine

## 2023-02-17 DIAGNOSIS — E1122 Type 2 diabetes mellitus with diabetic chronic kidney disease: Secondary | ICD-10-CM | POA: Insufficient documentation

## 2023-02-17 DIAGNOSIS — I10 Essential (primary) hypertension: Secondary | ICD-10-CM

## 2023-02-17 DIAGNOSIS — Z79899 Other long term (current) drug therapy: Secondary | ICD-10-CM | POA: Insufficient documentation

## 2023-02-17 DIAGNOSIS — N183 Chronic kidney disease, stage 3 unspecified: Secondary | ICD-10-CM | POA: Insufficient documentation

## 2023-02-17 DIAGNOSIS — R0789 Other chest pain: Secondary | ICD-10-CM | POA: Insufficient documentation

## 2023-02-17 DIAGNOSIS — E785 Hyperlipidemia, unspecified: Secondary | ICD-10-CM | POA: Insufficient documentation

## 2023-02-17 DIAGNOSIS — Z7984 Long term (current) use of oral hypoglycemic drugs: Secondary | ICD-10-CM | POA: Insufficient documentation

## 2023-02-17 DIAGNOSIS — E119 Type 2 diabetes mellitus without complications: Secondary | ICD-10-CM

## 2023-02-17 DIAGNOSIS — Z7982 Long term (current) use of aspirin: Secondary | ICD-10-CM | POA: Insufficient documentation

## 2023-02-17 DIAGNOSIS — I129 Hypertensive chronic kidney disease with stage 1 through stage 4 chronic kidney disease, or unspecified chronic kidney disease: Secondary | ICD-10-CM | POA: Insufficient documentation

## 2023-02-17 DIAGNOSIS — Z794 Long term (current) use of insulin: Secondary | ICD-10-CM | POA: Insufficient documentation

## 2023-02-17 DIAGNOSIS — Z8673 Personal history of transient ischemic attack (TIA), and cerebral infarction without residual deficits: Secondary | ICD-10-CM | POA: Insufficient documentation

## 2023-02-17 LAB — CBC
HCT: 42.5 % (ref 39.0–52.0)
Hemoglobin: 14.4 g/dL (ref 13.0–17.0)
MCH: 29.3 pg (ref 26.0–34.0)
MCHC: 33.9 g/dL (ref 30.0–36.0)
MCV: 86.6 fL (ref 80.0–100.0)
Platelets: 256 10*3/uL (ref 150–400)
RBC: 4.91 MIL/uL (ref 4.22–5.81)
RDW: 12.6 % (ref 11.5–15.5)
WBC: 8.3 10*3/uL (ref 4.0–10.5)
nRBC: 0 % (ref 0.0–0.2)

## 2023-02-17 LAB — TROPONIN I (HIGH SENSITIVITY)
Troponin I (High Sensitivity): 4 ng/L (ref <18)
Troponin I (High Sensitivity): 4 ng/L (ref <18)

## 2023-02-17 LAB — BASIC METABOLIC PANEL
Anion gap: 11 (ref 5–15)
BUN: 21 mg/dL (ref 8–23)
CO2: 25 mmol/L (ref 22–32)
Calcium: 9.4 mg/dL (ref 8.9–10.3)
Chloride: 100 mmol/L (ref 98–111)
Creatinine, Ser: 1.46 mg/dL — ABNORMAL HIGH (ref 0.61–1.24)
GFR, Estimated: 53 mL/min — ABNORMAL LOW (ref >60)
Glucose, Bld: 155 mg/dL — ABNORMAL HIGH (ref 70–99)
Potassium: 4.1 mmol/L (ref 3.5–5.1)
Sodium: 136 mmol/L (ref 135–145)

## 2023-02-17 NOTE — ED Provider Triage Note (Signed)
 Emergency Medicine Provider Triage Evaluation Note  Lonnie Brown , a 63 y.o. male  was evaluated in triage.  Pt complains of chest pain that awakened him from sleep.  CP resolved shortly thereafter.  Called the nursing hotline and was advised to take an aspirin  and come to the ER.  Review of Systems  Positive: CP Negative: vomiting  Physical Exam  Ht 5' 11 (1.803 m)   Wt (!) 142.4 kg   BMI 43.79 kg/m  Gen:   Awake, no distress   Resp:  Normal effort  MSK:   Moves extremities without difficulty  Other:    Medical Decision Making  Medically screening exam initiated at 1:30 AM.  Appropriate orders placed.  Lonnie Brown was informed that the remainder of the evaluation will be completed by another provider, this initial triage assessment does not replace that evaluation, and the importance of remaining in the ED until their evaluation is complete.     Lonnie Charleston, PA-C 02/17/23 [id number]

## 2023-02-17 NOTE — ED Provider Notes (Signed)
 [place] EMERGENCY DEPARTMENT AT [facility] Provider Note  CSN: [id number] Arrival date & time: 02/17/23 0107  Chief Complaint(s) Chest Pain  HPI Lonnie Brown is a 63 y.o. male with a past medical history listed below who presents to the emergency department for chest tightness that awoke him from sleep around 11 PM.  Lasted approximately 2 minutes and resolve spontaneously.  Improved after sitting up.  Denied any exertional chest pain.  No associated nausea or vomiting.  No shortness of breath.  No recent fevers or infections.  No cough or congestion.  Patient has not felt the pain since.  The history is provided by the patient.    Past Medical History Past Medical History:  Diagnosis Date   Diabetes (HCC)    Hypercholesterolemia    Hypertension    Patient Active Problem List   Diagnosis Date Noted   Need for immunization against influenza 01/04/2022   TIA (transient ischemic attack) 02/06/2019   Cerebellar artery occlusion or stenosis 02/06/2019   Thyroid enlarged 02/06/2019   CVA (cerebral vascular accident) (HCC) 01/20/2019   Hyperlipidemia associated with type 2 diabetes mellitus (HCC) 12/02/2018   Chronic midline low back pain without sciatica 09/02/2018   Controlled type 2 diabetes mellitus without complication, without long-term current use of insulin  (HCC) 03/01/2018   Erectile dysfunction 03/01/2018   Noncompliance with medications 03/01/2018   CKD (chronic kidney disease) stage 3, GFR 30-59 ml/min (HCC) 08/27/2017   Hyperlipidemia 09/25/2016   Morbid obesity (HCC) 07/20/2015   Achilles tendinitis 04/09/2015   Hypertension 03/04/2015   Home Medication(s) Prior to Admission medications   Medication Sig Start Date End Date Taking? Authorizing Provider  amLODipine  (NORVASC ) 10 MG tablet Take 1 tablet (10 mg total) by mouth daily. 08/22/22   Vicci Barnie NOVAK, MD  aspirin  81 MG EC tablet Take 1 tablet (81 mg total) by mouth daily. 11/20/19   Vicci Barnie NOVAK, MD  atorvastatin  (LIPITOR ) 80 MG tablet Take 1 tablet (80 mg total) by mouth daily at 6 PM. 08/21/22   Vicci Barnie NOVAK, MD  famotidine  (PEPCID ) 20 MG tablet Take 1 tablet (20 mg total) by mouth daily. 03/31/22 07/08/22  Dreama, [place]  N, FNP  hydrochlorothiazide  (HYDRODIURIL ) 25 MG tablet Take 1 tablet (25 mg total) by mouth daily. 08/21/22   Vicci Barnie NOVAK, MD  metFORMIN  (GLUCOPHAGE ) 1000 MG tablet Take 1 tablet (1,000 mg total) by mouth 2 (two) times daily with a meal. 01/06/22   Paseda, Folashade R, FNP  sildenafil  (VIAGRA ) 50 MG tablet Take 1 tab by mouth half hour to 1 hour before sexual intercourse.  Limit to 1 tablet per 24-hour. 08/21/22   Vicci Barnie NOVAK, MD  spironolactone  (ALDACTONE ) 25 MG tablet Take 1 tablet (25 mg total) by mouth daily. 02/21/22   Paseda, Folashade R, FNP  Allergies Lisinopril  and Banana  Review of Systems Review of Systems As noted in HPI  Physical Exam Vital Signs  I have reviewed the triage vital signs BP (!) 152/95   Pulse 84   Temp 98.6 F (37 C) (Oral)   Resp 16   Ht 5' 11 (1.803 m)   Wt (!) 142.4 kg   SpO2 97%   BMI 43.79 kg/m   Physical Exam Vitals reviewed.  Constitutional:      General: He is not in acute distress.    Appearance: He is well-developed. He is not diaphoretic.  HENT:     Head: Normocephalic and atraumatic.     Right Ear: External ear normal.     Left Ear: External ear normal.     Nose: Nose normal.     Mouth/Throat:     Mouth: Mucous membranes are moist.  Eyes:     General: No scleral icterus.    Conjunctiva/sclera: Conjunctivae normal.  Neck:     Trachea: Phonation normal.  Cardiovascular:     Rate and Rhythm: Normal rate. Rhythm regularly irregular.  Pulmonary:     Effort: Pulmonary effort is normal. No respiratory distress.     Breath sounds: No stridor.   Abdominal:     General: There is no distension.  Musculoskeletal:        General: Normal range of motion.     Cervical back: Normal range of motion.  Neurological:     Mental Status: He is alert and oriented to person, place, and time.  Psychiatric:        Behavior: Behavior normal.     ED Results and Treatments Labs (all labs ordered are listed, but only abnormal results are displayed) Labs Reviewed  BASIC METABOLIC PANEL - Abnormal; Notable for the following components:      Result Value   Glucose, Bld 155 (*)    Creatinine, Ser 1.46 (*)    GFR, Estimated 53 (*)    All other components within normal limits  CBC  TROPONIN I (HIGH SENSITIVITY)  TROPONIN I (HIGH SENSITIVITY)                                                                                                                         EKG  EKG Interpretation Date/Time:  Saturday February 17 2023 01:32:09 EST Ventricular Rate:  83 PR Interval:  158 QRS Duration:  100 QT Interval:  384 QTC Calculation: 451 R Axis:   -62  Text Interpretation: Sinus rhythm with occasional Premature ventricular complexes Left axis deviation Incomplete right bundle branch block Inferior infarct , age undetermined Anterior infarct , age undetermined Abnormal ECG When compared with ECG of 31-Mar-2022 14:57, PREVIOUS ECG IS PRESENT Confirmed by Trine Likes [id number]) on 02/17/2023 5:20:27 AM       Radiology DG Chest 2 View Result Date: 02/17/2023 CLINICAL DATA:  Chest pain. EXAM: CHEST - 2 VIEW COMPARISON:  January 06, 2018 FINDINGS: The heart size and mediastinal contours are  within normal limits. Mild atelectasis versus breast attenuation artifact is seen overlying the right lung base. There is no evidence of acute infiltrate, pleural effusion or pneumothorax. The visualized skeletal structures are unremarkable. IMPRESSION: No active cardiopulmonary disease. Electronically Signed   By: Suzen Dials M.D.   On: 02/17/2023 02:07     Medications Ordered in ED Medications - No data to display Procedures Procedures  (including critical care time) Medical Decision Making / ED Course   Medical Decision Making Amount and/or Complexity of Data Reviewed Labs: ordered. Decision-making details documented in ED Course. Radiology: ordered and independent interpretation performed. Decision-making details documented in ED Course. ECG/medicine tests: ordered and independent interpretation performed. Decision-making details documented in ED Course.    Atypical chest pain.  Favoring GI related process but given patient's past medical history, cardiac workup obtained.  EKG without acute ischemic changes.  Serial troponins negative x 2 unlikely ACS in this clinical picture.  Presentation not classic for dissection or esophageal perforation.  Low suspicion for pulmonary embolism.  CBC without leukocytosis or anemia.  Metabolic panel without significant electrolyte derangements.  Renal sufficiency without evidence of AKI.  Chest x-ray without evidence of pneumonia, pneumothorax, pulmonary edema pleural effusion.  Feel patient is stable for discharge home but recommend establishing care with a cardiologist for stress testing.    Final Clinical Impression(s) / ED Diagnoses Final diagnoses:  Atypical chest pain  Primary hypertension  Morbid obesity (HCC)  Hyperlipidemia, unspecified hyperlipidemia type  Controlled type 2 diabetes mellitus without complication, without long-term current use of insulin  (HCC)   The patient appears reasonably screened and/or stabilized for discharge and I doubt any other medical condition or other [facility] requiring further screening, evaluation, or treatment in the ED at this time. I have discussed the findings, Dx and Tx plan with the patient/family who expressed understanding and agree(s) with the plan. Discharge instructions discussed at length. The patient/family was given strict return precautions who  verbalized understanding of the instructions. No further questions at time of discharge.  Disposition: Discharge  Condition: Good  ED Discharge Orders          Ordered    Ambulatory referral to Cardiology       Comments: If you have not heard from the Cardiology office within the next 72 hours please call [phone].   02/17/23 [id number]             Follow Up: Vicci Barnie NOVAK, MD [address] [place] [address] [place] [address] [phone]  Call  to schedule an appointment for close follow up    This chart was dictated using voice recognition software.  Despite best efforts to proofread,  errors can occur which can change the documentation meaning.    Trine Raynell Moder, MD 02/17/23 [phone]

## 2023-02-27 ENCOUNTER — Other Ambulatory Visit: Payer: Self-pay

## 2023-04-14 ENCOUNTER — Emergency Department (HOSPITAL_COMMUNITY)
Admission: EM | Admit: 2023-04-14 | Discharge: 2023-04-14 | Disposition: A | Discharge status: Home / Self Care | Payer: Self-pay | Attending: Emergency Medicine | Admitting: Emergency Medicine

## 2023-04-14 ENCOUNTER — Emergency Department (HOSPITAL_COMMUNITY): Payer: Self-pay

## 2023-04-14 ENCOUNTER — Other Ambulatory Visit: Payer: Self-pay

## 2023-04-14 ENCOUNTER — Encounter (HOSPITAL_COMMUNITY): Payer: Self-pay | Admitting: Emergency Medicine

## 2023-04-14 DIAGNOSIS — R0602 Shortness of breath: Secondary | ICD-10-CM | POA: Insufficient documentation

## 2023-04-14 DIAGNOSIS — R079 Chest pain, unspecified: Secondary | ICD-10-CM | POA: Insufficient documentation

## 2023-04-14 DIAGNOSIS — Z7982 Long term (current) use of aspirin: Secondary | ICD-10-CM | POA: Insufficient documentation

## 2023-04-14 LAB — CBC WITH DIFFERENTIAL/PLATELET
Abs Immature Granulocytes: 0.01 10*3/uL (ref 0.00–0.07)
Basophils Absolute: 0 10*3/uL (ref 0.0–0.1)
Basophils Relative: 1 %
Eosinophils Absolute: 0.1 10*3/uL (ref 0.0–0.5)
Eosinophils Relative: 2 %
HCT: 41.4 % (ref 39.0–52.0)
Hemoglobin: 13.5 g/dL (ref 13.0–17.0)
Immature Granulocytes: 0 %
Lymphocytes Relative: 35 %
Lymphs Abs: 2.4 10*3/uL (ref 0.7–4.0)
MCH: 29.2 pg (ref 26.0–34.0)
MCHC: 32.6 g/dL (ref 30.0–36.0)
MCV: 89.4 fL (ref 80.0–100.0)
Monocytes Absolute: 0.7 10*3/uL (ref 0.1–1.0)
Monocytes Relative: 10 %
Neutro Abs: 3.6 10*3/uL (ref 1.7–7.7)
Neutrophils Relative %: 52 %
Platelets: 234 10*3/uL (ref 150–400)
RBC: 4.63 MIL/uL (ref 4.22–5.81)
RDW: 12.9 % (ref 11.5–15.5)
WBC: 6.8 10*3/uL (ref 4.0–10.5)
nRBC: 0 % (ref 0.0–0.2)

## 2023-04-14 LAB — BASIC METABOLIC PANEL WITH GFR
Anion gap: 8 (ref 5–15)
BUN: 13 mg/dL (ref 8–23)
CO2: 24 mmol/L (ref 22–32)
Calcium: 8.7 mg/dL — ABNORMAL LOW (ref 8.9–10.3)
Chloride: 105 mmol/L (ref 98–111)
Creatinine, Ser: 1.41 mg/dL — ABNORMAL HIGH (ref 0.61–1.24)
GFR, Estimated: 56 mL/min — ABNORMAL LOW (ref >60)
Glucose, Bld: 204 mg/dL — ABNORMAL HIGH (ref 70–99)
Potassium: 4.1 mmol/L (ref 3.5–5.1)
Sodium: 137 mmol/L (ref 135–145)

## 2023-04-14 LAB — HEPATIC FUNCTION PANEL
ALT: 25 U/L (ref 0–44)
AST: 21 U/L (ref 15–41)
Albumin: 3 g/dL — ABNORMAL LOW (ref 3.5–5.0)
Alkaline Phosphatase: 66 U/L (ref 38–126)
Bilirubin, Direct: 0.1 mg/dL (ref 0.0–0.2)
Indirect Bilirubin: 0.5 mg/dL (ref 0.3–0.9)
Total Bilirubin: 0.6 mg/dL (ref 0.0–1.2)
Total Protein: 6.9 g/dL (ref 6.5–8.1)

## 2023-04-14 LAB — TROPONIN I (HIGH SENSITIVITY)
Troponin I (High Sensitivity): 4 ng/L (ref <18)
Troponin I (High Sensitivity): 3 ng/L (ref <18)

## 2023-04-14 LAB — D-DIMER, QUANTITATIVE: D-Dimer, Quant: 0.38 ug{FEU}/mL (ref 0.00–0.50)

## 2023-04-14 LAB — LIPASE, BLOOD: Lipase: 41 U/L (ref 11–51)

## 2023-04-14 LAB — CBG MONITORING, ED: Glucose-Capillary: 199 mg/dL — ABNORMAL HIGH (ref 70–99)

## 2023-04-14 MED ORDER — ALUM & MAG HYDROXIDE-SIMETH 200-200-20 MG/5ML PO SUSP
30.0000 mL | Freq: Once | ORAL | Status: AC
  Administered 2023-04-14: 30 mL via ORAL
  Filled 2023-04-14: qty 30

## 2023-04-14 MED ORDER — ALBUTEROL SULFATE HFA 108 (90 BASE) MCG/ACT IN AERS: 2.0000 | INHALATION_SPRAY | RESPIRATORY_TRACT | Status: DC | PRN

## 2023-04-14 NOTE — ED Triage Notes (Signed)
 Pt reports eating at buffet and then noticed SOB with exertion, light headedness and diaphoresis. Denies any CP, n/v. Reports he has not drinking much fluids today and been in the heat.

## 2023-04-14 NOTE — ED Notes (Signed)
Pt coming from xray 

## 2023-04-14 NOTE — ED Provider Notes (Signed)
 [place] EMERGENCY DEPARTMENT AT [facility] Provider Note   CSN: [id number] Arrival date & time: 04/14/23  1442     History  Chief Complaint  Patient presents with   Shortness of Breath    Lonnie Brown is a 63 y.o. male.  63 yo M with a chief complaints of suddenly feeling not well while he was eating at a buffet.  He said he became very short of breath and was having trouble getting up and risk.  He denies cough congestion or fever.  Denies trauma.  He denies nausea vomiting or diarrhea.  Yesterday was fine.  Denies any new medications.   Shortness of Breath      Home Medications Prior to Admission medications   Medication Sig Start Date End Date Taking? Authorizing Provider  amLODipine (NORVASC) 10 MG tablet Take 1 tablet (10 mg total) by mouth daily. 08/22/22   Marcine Matar, MD  aspirin 81 MG EC tablet Take 1 tablet (81 mg total) by mouth daily. 11/20/19   Marcine Matar, MD  atorvastatin (LIPITOR) 80 MG tablet Take 1 tablet (80 mg total) by mouth daily at 6 PM. 08/21/22   Marcine Matar, MD  famotidine (PEPCID) 20 MG tablet Take 1 tablet (20 mg total) by mouth daily. 03/31/22 07/08/22  Garrison, [place] N, FNP  hydrochlorothiazide (HYDRODIURIL) 25 MG tablet Take 1 tablet (25 mg total) by mouth daily. 08/21/22   Marcine Matar, MD  metFORMIN (GLUCOPHAGE) 1000 MG tablet Take 1 tablet (1,000 mg total) by mouth 2 (two) times daily with a meal. 01/06/22   Paseda, Baird Kay, FNP  sildenafil (VIAGRA) 50 MG tablet Take 1 tab by mouth half hour to 1 hour before sexual intercourse.  Limit to 1 tablet per 24-hour. 08/21/22   Marcine Matar, MD  spironolactone (ALDACTONE) 25 MG tablet Take 1 tablet (25 mg total) by mouth daily. 02/21/22   Donell Beers, FNP      Allergies    Lisinopril and Banana    Review of Systems   Review of Systems  Respiratory:  Positive for shortness of breath.     Physical Exam Updated Vital Signs BP (!) 160/95    Pulse 73   Temp (!) 97.5 F (36.4 C) (Oral)   Resp (!) 26   Ht 5\' 11"  (1.803 m)   Wt (!) 142 kg   SpO2 100%   BMI 43.66 kg/m  Physical Exam Vitals and nursing note reviewed.  Constitutional:      Appearance: He is well-developed.     Comments: BMI 44  HENT:     Head: Normocephalic and atraumatic.  Eyes:     Pupils: Pupils are equal, round, and reactive to light.  Neck:     Vascular: No JVD.  Cardiovascular:     Rate and Rhythm: Normal rate and regular rhythm.     Heart sounds: No murmur heard.    No friction rub. No gallop.  Pulmonary:     Effort: No respiratory distress.     Breath sounds: No wheezing.  Abdominal:     General: There is no distension.     Tenderness: There is no abdominal tenderness. There is no guarding or rebound.  Musculoskeletal:        General: Normal range of motion.     Cervical back: Normal range of motion and neck supple.  Skin:    Coloration: Skin is not pale.     Findings: No rash.  Neurological:  Mental Status: He is alert and oriented to person, place, and time.  Psychiatric:        Behavior: Behavior normal.     ED Results / Procedures / Treatments   Labs (all labs ordered are listed, but only abnormal results are displayed) Labs Reviewed  BASIC METABOLIC PANEL WITH GFR - Abnormal; Notable for the following components:      Result Value   Glucose, Bld 204 (*)    Creatinine, Ser 1.41 (*)    Calcium 8.7 (*)    GFR, Estimated 56 (*)    All other components within normal limits  HEPATIC FUNCTION PANEL - Abnormal; Notable for the following components:   Albumin 3.0 (*)    All other components within normal limits  CBG MONITORING, ED - Abnormal; Notable for the following components:   Glucose-Capillary 199 (*)    All other components within normal limits  CBC WITH DIFFERENTIAL/PLATELET  LIPASE, BLOOD  D-DIMER, QUANTITATIVE  TROPONIN I (HIGH SENSITIVITY)  TROPONIN I (HIGH SENSITIVITY)    EKG EKG  Interpretation Date/Time:  Saturday April 14 2023 14:49:45 EDT Ventricular Rate:  81 PR Interval:  170 QRS Duration:  110 QT Interval:  400 QTC Calculation: 464 R Axis:   -13  Text Interpretation: Sinus rhythm with occasional Premature ventricular complexes Inferior infarct , age undetermined Anterior infarct , age undetermined Abnormal ECG No significant change since last tracing Confirmed by Melene Plan [phone]) on 04/14/2023 3:28:20 PM  Radiology DG Chest 2 View Result Date: 04/14/2023 CLINICAL DATA:  Shortness of breath. EXAM: CHEST - 2 VIEW COMPARISON:  Chest radiograph dated 02/17/2023. FINDINGS: Mild central vascular congestion. No focal consolidation, pleural effusion or pneumothorax. The cardiac silhouette is within limits. No acute osseous pathology. IMPRESSION: Mild central vascular congestion. No focal consolidation. Electronically Signed   By: Elgie Collard M.D.   On: 04/14/2023 15:44    Procedures Procedures    Medications Ordered in ED Medications  albuterol (VENTOLIN HFA) 108 (90 Base) MCG/ACT inhaler 2 puff (has no administration in time range)  alum & mag hydroxide-simeth (MAALOX/MYLANTA) 200-200-20 MG/5ML suspension 30 mL (30 mLs Oral Given 04/14/23 1611)    ED Course/ Medical Decision Making/ A&P                                 Medical Decision Making Amount and/or Complexity of Data Reviewed Labs: ordered. Radiology: ordered.  Risk OTC drugs. Prescription drug management.   63 yo M with a chief complaints of sudden onset difficulty breathing.  This occurred while he was eating at a buffet.  He said he got very diaphoretic with this.  Is a bit diaphoretic on my initial exam.  Will obtain blood work to assess for possible acs, ddimer, LFT, lipase.  GI cocktail. Reassess.  LFTs and lipase are unremarkable.  D-dimer negative.  2 troponins are negative.  Chest x-ray independently interpreted by me without focal infiltrate or pneumothorax.  Patient feeling much  better on repeat assessment.  His wife is concerned about the amount of diaphoresis that he has normally.  I had a short discussion with her about this.  Encouraged her to follow-up with his PCP.  8:02 PM:  I have discussed the diagnosis/risks/treatment options with the patient and family.  Evaluation and diagnostic testing in the emergency department does not suggest an emergent condition requiring admission or immediate intervention beyond what has been performed at this time.  They  will follow up with PCP. We also discussed returning to the ED immediately if new or worsening sx occur. We discussed the sx which are most concerning (e.g., sudden worsening pain, fever, inability to tolerate by mouth) that necessitate immediate return. Medications administered to the patient during their visit and any new prescriptions provided to the patient are listed below.  Medications given during this visit Medications  albuterol (VENTOLIN HFA) 108 (90 Base) MCG/ACT inhaler 2 puff (has no administration in time range)  alum & mag hydroxide-simeth (MAALOX/MYLANTA) 200-200-20 MG/5ML suspension 30 mL (30 mLs Oral Given 04/14/23 1611)     The patient appears reasonably screen and/or stabilized for discharge and I doubt any other medical condition or other [facility] requiring further screening, evaluation, or treatment in the ED at this time prior to discharge.          Final Clinical Impression(s) / ED Diagnoses Final diagnoses:  Nonspecific chest pain    Rx / DC Orders ED Discharge Orders     None         Melene Plan, DO 04/14/23 2002

## 2023-04-14 NOTE — Discharge Instructions (Signed)
 I think this likely based on your timeline that you experienced indigestion.  Try pepcid or tagamet up to twice a day.  Try to avoid things that may make this worse, most commonly these are spicy foods tomato based products fatty foods chocolate and peppermint.  Alcohol and tobacco can also make this worse.  Return to the emergency department for sudden worsening pain fever or inability to eat or drink.  Please also return the emergency department for pain worse upon exertion or if you start coughing up blood or if you pass out.

## 2023-05-16 ENCOUNTER — Ambulatory Visit: Payer: Medicaid Other | Attending: Cardiology | Admitting: Cardiology

## 2023-05-17 ENCOUNTER — Encounter: Payer: Self-pay | Admitting: Cardiology

## 2023-05-25 ENCOUNTER — Emergency Department (HOSPITAL_COMMUNITY)
Admission: EM | Admit: 2023-05-25 | Discharge: 2023-05-25 | Disposition: A | Discharge status: Home / Self Care | Payer: Self-pay | Attending: Emergency Medicine | Admitting: Emergency Medicine

## 2023-05-25 ENCOUNTER — Other Ambulatory Visit: Payer: Self-pay

## 2023-05-25 ENCOUNTER — Encounter (HOSPITAL_COMMUNITY): Payer: Self-pay | Admitting: Emergency Medicine

## 2023-05-25 DIAGNOSIS — Z79899 Other long term (current) drug therapy: Secondary | ICD-10-CM | POA: Insufficient documentation

## 2023-05-25 DIAGNOSIS — Z7982 Long term (current) use of aspirin: Secondary | ICD-10-CM | POA: Insufficient documentation

## 2023-05-25 DIAGNOSIS — H6123 Impacted cerumen, bilateral: Secondary | ICD-10-CM | POA: Insufficient documentation

## 2023-05-25 DIAGNOSIS — Z7984 Long term (current) use of oral hypoglycemic drugs: Secondary | ICD-10-CM | POA: Insufficient documentation

## 2023-05-25 DIAGNOSIS — I10 Essential (primary) hypertension: Secondary | ICD-10-CM | POA: Insufficient documentation

## 2023-05-25 NOTE — ED Triage Notes (Signed)
 Pt in with bilateral ear pain x 1 wk. Pt denies any fevers, chills or congestion

## 2023-05-25 NOTE — ED Provider Notes (Signed)
  EMERGENCY DEPARTMENT AT [facility] Provider Note   CSN: [id number] Arrival date & time: 05/25/23  0419     History  Chief Complaint  Patient presents with   Otalgia    Lonnie Brown is a 63 y.o. male.  Patient is a 63 year old male presenting today due to complaints of decreased hearing in bilateral ears.  He denies any otalgia or drainage.  He reports occasionally this happens and he has to have his ears cleaned out.  The history is provided by the patient.  Otalgia      Home Medications Prior to Admission medications   Medication Sig Start Date End Date Taking? Authorizing Provider  amLODipine  (NORVASC ) 10 MG tablet Take 1 tablet (10 mg total) by mouth daily. 08/22/22   Lawrance Presume, MD  aspirin  81 MG EC tablet Take 1 tablet (81 mg total) by mouth daily. 11/20/19   Lawrance Presume, MD  atorvastatin  (LIPITOR ) 80 MG tablet Take 1 tablet (80 mg total) by mouth daily at 6 PM. 08/21/22   Lawrance Presume, MD  famotidine  (PEPCID ) 20 MG tablet Take 1 tablet (20 mg total) by mouth daily. 03/31/22 07/08/22  Harlow Lighter, [place]  N, FNP  hydrochlorothiazide  (HYDRODIURIL ) 25 MG tablet Take 1 tablet (25 mg total) by mouth daily. 08/21/22   Lawrance Presume, MD  metFORMIN  (GLUCOPHAGE ) 1000 MG tablet Take 1 tablet (1,000 mg total) by mouth 2 (two) times daily with a meal. 01/06/22   Paseda, Folashade R, FNP  sildenafil  (VIAGRA ) 50 MG tablet Take 1 tab by mouth half hour to 1 hour before sexual intercourse.  Limit to 1 tablet per 24-hour. 08/21/22   Lawrance Presume, MD  spironolactone  (ALDACTONE ) 25 MG tablet Take 1 tablet (25 mg total) by mouth daily. 02/21/22   Paseda, Folashade R, FNP      Allergies    Lisinopril  and Banana    Review of Systems   Review of Systems  HENT:  Positive for ear pain.     Physical Exam Updated Vital Signs BP (!) 182/104 (BP Location: Right Arm)   Pulse 81   Temp 98.1 F (36.7 C)   Resp 14   Wt (!) 142 kg   SpO2 99%    BMI 43.66 kg/m  Physical Exam Vitals and nursing note reviewed.  HENT:     Head: Normocephalic.     Right Ear: There is impacted cerumen.     Left Ear: There is impacted cerumen.  Cardiovascular:     Rate and Rhythm: Normal rate.  Musculoskeletal:     Cervical back: Normal range of motion.  Skin:    General: Skin is warm.  Neurological:     Mental Status: He is alert. Mental status is at baseline.  Psychiatric:        Mood and Affect: Mood normal.     ED Results / Procedures / Treatments   Labs (all labs ordered are listed, but only abnormal results are displayed) Labs Reviewed - No data to display  EKG None  Radiology No results found.  Procedures Procedures    Medications Ordered in ED Medications - No data to display  ED Course/ Medical Decision Making/ A&P                                 Medical Decision Making  Patient presenting today due to complaint of bilateral cerumen impaction and decreased hearing.  And is noted to be hypertensive today he will need to take his prescribed meds        Final Clinical Impression(s) / ED Diagnoses Final diagnoses:  Bilateral impacted cerumen    Rx / DC Orders ED Discharge Orders     None         Almond Army, MD 05/25/23 [phone]

## 2023-05-25 NOTE — Discharge Instructions (Signed)
 Use 1/2 peroxide and 1/2 water and put about 3-89mL in each ear and cotton ball to let it sit.  Do that 2 times a day till the rest of the wax comes out.  Then you can do it once a week to ensure the wax doesn't build back up

## 2023-06-19 ENCOUNTER — Other Ambulatory Visit: Payer: Self-pay | Admitting: Internal Medicine

## 2023-06-19 ENCOUNTER — Other Ambulatory Visit: Payer: Self-pay

## 2023-06-19 DIAGNOSIS — I10 Essential (primary) hypertension: Secondary | ICD-10-CM

## 2023-06-19 DIAGNOSIS — I663 Occlusion and stenosis of cerebellar arteries: Secondary | ICD-10-CM

## 2023-06-20 ENCOUNTER — Other Ambulatory Visit: Payer: Self-pay

## 2023-06-20 MED ORDER — ATORVASTATIN CALCIUM 80 MG PO TABS
80.0000 mg | ORAL_TABLET | Freq: Every day | ORAL | 0 refills | Status: DC
  Filled 2023-06-20: qty 30, 30d supply, fill #0

## 2023-06-20 MED ORDER — HYDROCHLOROTHIAZIDE 25 MG PO TABS
25.0000 mg | ORAL_TABLET | Freq: Every day | ORAL | 0 refills | Status: DC
  Filled 2023-06-20: qty 30, 30d supply, fill #0

## 2023-06-26 ENCOUNTER — Other Ambulatory Visit: Payer: Self-pay | Admitting: Nurse Practitioner

## 2023-06-26 ENCOUNTER — Other Ambulatory Visit: Payer: Self-pay

## 2023-06-26 DIAGNOSIS — E1169 Type 2 diabetes mellitus with other specified complication: Secondary | ICD-10-CM

## 2023-06-26 MED ORDER — METFORMIN HCL 1000 MG PO TABS
1000.0000 mg | ORAL_TABLET | Freq: Two times a day (BID) | ORAL | 1 refills | Status: DC
  Filled 2023-06-26 – 2023-07-06 (×2): qty 60, 30d supply, fill #0

## 2023-06-26 NOTE — Telephone Encounter (Signed)
 Please advise [facility]

## 2023-06-27 ENCOUNTER — Other Ambulatory Visit: Payer: Self-pay

## 2023-07-06 ENCOUNTER — Other Ambulatory Visit: Payer: Self-pay

## 2023-07-18 ENCOUNTER — Other Ambulatory Visit: Payer: Self-pay

## 2023-07-24 ENCOUNTER — Encounter: Payer: Self-pay | Admitting: Internal Medicine

## 2023-07-24 ENCOUNTER — Other Ambulatory Visit: Payer: Self-pay

## 2023-07-24 ENCOUNTER — Ambulatory Visit: Payer: Self-pay | Attending: Internal Medicine | Admitting: Internal Medicine

## 2023-07-24 DIAGNOSIS — I10 Essential (primary) hypertension: Secondary | ICD-10-CM

## 2023-07-24 DIAGNOSIS — Z23 Encounter for immunization: Secondary | ICD-10-CM

## 2023-07-24 DIAGNOSIS — E119 Type 2 diabetes mellitus without complications: Secondary | ICD-10-CM

## 2023-07-24 DIAGNOSIS — E1159 Type 2 diabetes mellitus with other circulatory complications: Secondary | ICD-10-CM

## 2023-07-24 DIAGNOSIS — L602 Onychogryphosis: Secondary | ICD-10-CM

## 2023-07-24 DIAGNOSIS — E1169 Type 2 diabetes mellitus with other specified complication: Secondary | ICD-10-CM

## 2023-07-24 DIAGNOSIS — Z6841 Body Mass Index (BMI) 40.0 and over, adult: Secondary | ICD-10-CM

## 2023-07-24 DIAGNOSIS — Z7984 Long term (current) use of oral hypoglycemic drugs: Secondary | ICD-10-CM

## 2023-07-24 DIAGNOSIS — Z91199 Patient's noncompliance with other medical treatment and regimen due to unspecified reason: Secondary | ICD-10-CM

## 2023-07-24 DIAGNOSIS — E785 Hyperlipidemia, unspecified: Secondary | ICD-10-CM

## 2023-07-24 DIAGNOSIS — N1831 Chronic kidney disease, stage 3a: Secondary | ICD-10-CM

## 2023-07-24 LAB — POCT GLYCOSYLATED HEMOGLOBIN (HGB A1C): HbA1c, POC (controlled diabetic range): 8 % — AB (ref 0.0–7.0)

## 2023-07-24 LAB — GLUCOSE, POCT (MANUAL RESULT ENTRY): POC Glucose: 169 mg/dL — AB (ref 70–99)

## 2023-07-24 MED ORDER — AMLODIPINE BESYLATE 10 MG PO TABS
10.0000 mg | ORAL_TABLET | Freq: Every day | ORAL | 1 refills | Status: AC
  Filled 2023-07-24: qty 90, 90d supply, fill #0

## 2023-07-24 MED ORDER — HYDROCHLOROTHIAZIDE 25 MG PO TABS
25.0000 mg | ORAL_TABLET | Freq: Every day | ORAL | 1 refills | Status: AC
  Filled 2023-07-24 – 2023-12-11 (×2): qty 90, 90d supply, fill #0

## 2023-07-24 MED ORDER — ATORVASTATIN CALCIUM 80 MG PO TABS
80.0000 mg | ORAL_TABLET | Freq: Every day | ORAL | 1 refills | Status: AC
  Filled 2023-07-24 – 2023-12-11 (×2): qty 90, 90d supply, fill #0

## 2023-07-24 MED ORDER — SPIRONOLACTONE 25 MG PO TABS
25.0000 mg | ORAL_TABLET | Freq: Every day | ORAL | 0 refills | Status: AC
  Filled 2023-07-24: qty 90, 90d supply, fill #0

## 2023-07-24 MED ORDER — METFORMIN HCL 1000 MG PO TABS
1000.0000 mg | ORAL_TABLET | Freq: Two times a day (BID) | ORAL | 1 refills | Status: AC
  Filled 2023-07-24: qty 180, 90d supply, fill #0

## 2023-07-24 NOTE — Progress Notes (Signed)
 Patient ID: Brodey Bonn, male    DOB: 1960/05/16  MRN: [id number]  CC: Diabetes (DM f/u. Med refill. /No questions / concerns/Yes to pneumonia vax )   Subjective: Arran Fessel is a 63 y.o. male who presents for chronic ds management. His concerns today include:  History of HTN, CKD stage 3, obesity, HL, DM, ED, TIA, Cerebellar artery occlusion, thyroid nodule   Discussed the use of AI scribe software for clinical note transcription with the patient, who gave verbal consent to proceed.  History of Present Illness Simuel Stebner is a 63 year old male with diabetes, hypertension, and hyperlipidemia who presents for follow-up care.  DM: Results for orders placed or performed in visit on 07/24/23  POCT glucose (manual entry)   Collection Time: 07/24/23 10:30 AM  Result Value Ref Range   POC Glucose 169 (A) 70 - 99 mg/dl  POCT glycosylated hemoglobin (Hb A1C)   Collection Time: 07/24/23 10:45 AM  Result Value Ref Range   Hemoglobin A1C     HbA1c POC (<> result, manual entry)     HbA1c, POC (prediabetic range)     HbA1c, POC (controlled diabetic range) 8.0 (A) 0.0 - 7.0 %  His current A1c is 8, up from 7.8. He has been taking metformin  1000 mg once daily instead of the prescribed twice daily despite no side effects.  States that he just forgets to take it.  I had recommended getting a med box in the past but he has not done so.  He has not been monitoring his blood glucose levels, although he has the necessary supplies. His diet includes pasta and sugar as weaknesses, with pasta consumed once a week in large quantities, and occasional fried foods like eggplant parmesan and fried chicken.  HTN/CKD: He has not been taking his antihypertensive medications consistently. He is prescribed hydrochlorothiazide  25 mg daily, spironolactone  25 mg daily, and amlodipine  10 mg daily. He has a history of a mini-stroke. No headaches, dizziness, or chest pain, but he notes some swelling in his  legs. He does not add salt to his food, and neither does his wife. - He has CKD 3A with GFR stable in the 50s.  Not on NSAIDs.  HL: Regarding hyperlipidemia, he is on atorvastatin  but has not been taking it consistently.   HM: He is due for a diabetic eye exam and a pneumonia vaccine.  He is still uninsured and never did apply for Medicaid as I suggested.  He will turn 63 this year so hopefully will get on Medicare at that time.     Patient Active Problem List   Diagnosis Date Noted   Need for immunization against influenza 01/04/2022   TIA (transient ischemic attack) 02/06/2019   Cerebellar artery occlusion or stenosis 02/06/2019   Thyroid enlarged 02/06/2019   CVA (cerebral vascular accident) (HCC) 01/20/2019   Hyperlipidemia associated with type 2 diabetes mellitus (HCC) 12/02/2018   Chronic midline low back pain without sciatica 09/02/2018   Controlled type 2 diabetes mellitus without complication, without long-term current use of insulin  (HCC) 03/01/2018   Erectile dysfunction 03/01/2018   Noncompliance with medications 03/01/2018   CKD (chronic kidney disease) stage 3, GFR 30-59 ml/min (HCC) 08/27/2017   Hyperlipidemia 09/25/2016   Morbid obesity (HCC) 07/20/2015   Achilles tendinitis 04/09/2015   Hypertension 03/04/2015     Current Outpatient Medications on File Prior to Visit  Medication Sig Dispense Refill   amLODipine  (NORVASC ) 10 MG tablet Take 1 tablet (10 mg total)  by mouth daily. 90 tablet 1   aspirin  81 MG EC tablet Take 1 tablet (81 mg total) by mouth daily. 90 tablet 1   atorvastatin  (LIPITOR ) 80 MG tablet Take 1 tablet (80 mg total) by mouth daily at 6 PM.Must have office visit for refills 30 tablet 0   hydrochlorothiazide  (HYDRODIURIL ) 25 MG tablet Take 1 tablet (25 mg total) by mouth daily.Must have office visit for refills 30 tablet 0   metFORMIN  (GLUCOPHAGE ) 1000 MG tablet Take 1 tablet (1,000 mg total) by mouth 2 (two) times daily with a meal. Needs  appointment prior to next refill request. 60 tablet 1   sildenafil  (VIAGRA ) 50 MG tablet Take 1 tab by mouth half hour to 1 hour before sexual intercourse.  Limit to 1 tablet per 24-hour. 10 tablet 0   spironolactone  (ALDACTONE ) 25 MG tablet Take 1 tablet (25 mg total) by mouth daily. 90 tablet 0   famotidine  (PEPCID ) 20 MG tablet Take 1 tablet (20 mg total) by mouth daily. (Patient not taking: Reported on 07/24/2023) 30 tablet 0   No current facility-administered medications on file prior to visit.    Allergies  Allergen Reactions   Lisinopril  Other (See Comments)    Angioedema   Banana Nausea And Vomiting    Social History   Socioeconomic History   Marital status: Married    Spouse name: Not on file   Number of children: Not on file   Years of education: Not on file   Highest education level: Not on file  Occupational History   Not on file  Tobacco Use   Smoking status: Never   Smokeless tobacco: Never  Vaping Use   Vaping status: Never Used  Substance and Sexual Activity   Alcohol use: No   Drug use: No   Sexual activity: Yes    Birth control/protection: None  Other Topics Concern   Not on file  Social History Narrative   Not on file   Social Drivers of Health   Financial Resource Strain: Low Risk  (07/24/2023)   Overall Financial Resource Strain (CARDIA)    Difficulty of Paying Living Expenses: Not hard at all  Food Insecurity: No Food Insecurity (07/24/2023)   Hunger Vital Sign    Worried About Running Out of Food in the Last Year: Never true    Ran Out of Food in the Last Year: Never true  Transportation Needs: No Transportation Needs (07/24/2023)   PRAPARE - Administrator, Civil Service (Medical): No    Lack of Transportation (Non-Medical): No  Physical Activity: Inactive (07/24/2023)   Exercise Vital Sign    Days of Exercise per Week: 0 days    Minutes of Exercise per Session: 0 min  Stress: No Stress Concern Present (07/24/2023)   [employer] of Occupational Health - Occupational Stress Questionnaire    Feeling of Stress: Not at all  Social Connections: Socially Integrated (07/24/2023)   Social Connection and Isolation Panel    Frequency of Communication with Friends and Family: Twice a week    Frequency of Social Gatherings with Friends and Family: Twice a week    Attends Religious Services: More than 4 times per year    Active Member of [employer] or Organizations: Yes    Attends Banker Meetings: More than 4 times per year    Marital Status: Married  Catering manager Violence: Not At Risk (07/24/2023)   Humiliation, Afraid, Rape, and Kick questionnaire    Fear of  Current or Ex-Partner: No    Emotionally Abused: No    Physically Abused: No    Sexually Abused: No    Family History  Problem Relation Age of Onset   Diabetes Mother    Hypertension Mother    Alcoholism Father     Past Surgical History:  Procedure Laterality Date   EYE SURGERY     TONSILLECTOMY      ROS: Review of Systems Negative except as stated above  PHYSICAL EXAM: BP (!) 167/107 (BP Location: Left Arm, Patient Position: Sitting, Cuff Size: Large)   Pulse 70   Temp 98.1 F (36.7 C) (Oral)   Ht 5' 11 (1.803 m)   Wt (!) 315 lb (142.9 kg)   SpO2 96%   BMI 43.93 kg/m   Wt Readings from Last 3 Encounters:  07/24/23 (!) 315 lb (142.9 kg)  05/25/23 (!) 313 lb 0.9 oz (142 kg)  04/14/23 (!) 313 lb 0.9 oz (142 kg)    Physical Exam General appearance - alert, well appearing, morbidly obese older African-American male and in no distress Mental status - normal mood, behavior, speech, dress, motor activity, and thought processes Neck - supple, no significant adenopathy Chest - clear to auscultation, no wheezes, rales or rhonchi, symmetric air entry Heart - normal rate, regular rhythm, normal S1, S2, no murmurs, rubs, clicks or gallops Extremities - trace LE edema Diabetic Foot Exam - Simple   Simple Foot Form Diabetic Foot  exam was performed with the following findings: Yes 07/24/2023 11:17 AM  Visual Inspection See comments: Yes Sensation Testing See comments: Yes Pulse Check Posterior Tibialis and Dorsalis pulse intact bilaterally: Yes Comments Skin on the plantar surface extremely dry.  He has noninflamed bunions on both big toes.  Toenails are significantly overgrown, thickened discolored.         Latest Ref Rng & Units 04/14/2023    3:47 PM 04/14/2023    2:54 PM 02/17/2023    1:38 AM  CMP  Glucose 70 - 99 mg/dL  795  844   BUN 8 - 23 mg/dL  13  21   Creatinine 9.38 - 1.24 mg/dL  8.58  8.53   Sodium 864 - 145 mmol/L  137  136   Potassium 3.5 - 5.1 mmol/L  4.1  4.1   Chloride 98 - 111 mmol/L  105  100   CO2 22 - 32 mmol/L  24  25   Calcium  8.9 - 10.3 mg/dL  8.7  9.4   Total Protein 6.5 - 8.1 g/dL 6.9     Total Bilirubin 0.0 - 1.2 mg/dL 0.6     Alkaline Phos 38 - 126 U/L 66     AST 15 - 41 U/L 21     ALT 0 - 44 U/L 25      Lipid Panel     Component Value Date/Time   CHOL 191 05/29/2022 1340   TRIG 192 (H) 05/29/2022 1340   HDL 30 (L) 05/29/2022 1340   CHOLHDL 6.4 (H) 05/29/2022 1340   CHOLHDL 4.3 01/20/2019 1330   VLDL 14 01/20/2019 1330   LDLCALC 127 (H) 05/29/2022 1340    CBC    Component Value Date/Time   WBC 6.8 04/14/2023 1454   RBC 4.63 04/14/2023 1454   HGB 13.5 04/14/2023 1454   HGB 14.3 08/10/2020 1559   HCT 41.4 04/14/2023 1454   HCT 43.6 08/10/2020 1559   PLT 234 04/14/2023 1454   PLT 283 08/10/2020 1559   MCV 89.4  04/14/2023 1454   MCV 88 08/10/2020 1559   MCH 29.2 04/14/2023 1454   MCHC 32.6 04/14/2023 1454   RDW 12.9 04/14/2023 1454   RDW 14.1 08/10/2020 1559   LYMPHSABS 2.4 04/14/2023 1454   MONOABS 0.7 04/14/2023 1454   EOSABS 0.1 04/14/2023 1454   BASOSABS 0.0 04/14/2023 1454    ASSESSMENT AND PLAN: 1. Type 2 diabetes mellitus with morbid obesity (HCC) (Primary) Not at goal due to nonadherence to medication. Discussed trying him with GLP-1 agonist to  help control diabetes and bring about some weight loss.  I went over how GLP-1 agonist work and potential side effects.  Patient states he will think about it.  For now he will continue metformin .  Encouraged him to take consistently and to purchase a med box. Dietary counseling given  - POCT glycosylated hemoglobin (Hb A1C) - POCT glucose (manual entry) - metFORMIN  (GLUCOPHAGE ) 1000 MG tablet; Take 1 tablet (1,000 mg total) by mouth 2 (two) times daily with a meal.  Dispense: 180 tablet; Refill: 1 - Microalbumin / creatinine urine ratio  2. Diabetes mellitus treated with oral medication (HCC) See #1 above  3. Hypertension associated with diabetes (HCC) Not at goal due to medication noncompliance.  Strongly encourage compliance with medications to help prevent cardiovascular events. - hydrochlorothiazide  (HYDRODIURIL ) 25 MG tablet; Take 1 tablet (25 mg total) by mouth daily.  Dispense: 90 tablet; Refill: 1 - amLODipine  (NORVASC ) 10 MG tablet; Take 1 tablet (10 mg total) by mouth daily.  Dispense: 90 tablet; Refill: 1 - spironolactone  (ALDACTONE ) 25 MG tablet; Take 1 tablet (25 mg total) by mouth daily.  Dispense: 90 tablet; Refill: 0  4. Hyperlipidemia associated with type 2 diabetes mellitus (HCC) Encourage compliance. - atorvastatin  (LIPITOR ) 80 MG tablet; Take 1 tablet (80 mg total) by mouth daily.  Dispense: 90 tablet; Refill: 1 - Lipid panel  5. Stage 3a chronic kidney disease (HCC) GFR has remained stable in the 50s.  Advised to avoid NSAIDs.  6. Overgrown toenails Recommend seeing podiatrist to have toenails clipped but he is currently uninsured.  7. Nonadherence to medical treatment Strongly encouraging adherence to medications to help prevent cardiovascular events.  8. Need for vaccination against Streptococcus pneumoniae PCV 20 given today     Patient was given the opportunity to ask questions.  Patient verbalized understanding of the plan and was able to repeat key  elements of the plan.   This documentation was completed using Paediatric nurse.  Any transcriptional errors are unintentional.  Orders Placed This Encounter  Procedures   POCT glycosylated hemoglobin (Hb A1C)   POCT glucose (manual entry)     Requested Prescriptions   Pending Prescriptions Disp Refills   atorvastatin  (LIPITOR ) 80 MG tablet 90 tablet 1    Sig: Take 1 tablet (80 mg total) by mouth daily at 6 PM.Must have office visit for refills   hydrochlorothiazide  (HYDRODIURIL ) 25 MG tablet 90 tablet 1    Sig: Take 1 tablet (25 mg total) by mouth daily.Must have office visit for refills   metFORMIN  (GLUCOPHAGE ) 1000 MG tablet 180 tablet 1    Sig: Take 1 tablet (1,000 mg total) by mouth 2 (two) times daily with a meal. Needs appointment prior to next refill request.   amLODipine  (NORVASC ) 10 MG tablet 90 tablet 1    Sig: Take 1 tablet (10 mg total) by mouth daily.    No follow-ups on file.  Barnie Louder, MD, FACP

## 2023-07-24 NOTE — Patient Instructions (Signed)
 VISIT SUMMARY:  Today, you came in for a follow-up visit to manage your diabetes, high blood pressure, and high cholesterol. We discussed your current medications, diet, and some new symptoms you have been experiencing. We also talked about some important health maintenance tasks that are due.  YOUR PLAN:  -TYPE 2 DIABETES MELLITUS: Your A1c level has increased to 8, which means your blood sugar is not as well controlled as it should be. We discussed the importance of taking your metformin  1000 mg twice daily as prescribed. You are also considering starting Trulicity, which can help with weight loss. Please try to reduce your intake of pasta and sugar. We will also check your urine for any signs of kidney issues and run some cholesterol tests.  -HYPERTENSION: Your blood pressure is elevated, and you have not been taking your blood pressure medications consistently. This increases your risk of another stroke. Please take your hydrochlorothiazide  25 mg and spironolactone  25 mg daily. Using a medication box may help you remember to take your medications. We will check your blood pressure again in 4 weeks with the clinical pharmacist.  -HYPERLIPIDEMIA: Your cholesterol levels are not well controlled because you have not been taking your atorvastatin  consistently. Please take your atorvastatin  as prescribed. We will also run some cholesterol tests.  -CHRONIC KIDNEY DISEASE: Your kidney function is not optimal but is being managed well. To prevent further damage, avoid using NSAIDs like ibuprofen  and use acetaminophen  for pain instead.  -OBESITY: You are currently at 315 pounds, which is considered obese. We discussed the possibility of starting Trulicity to help with weight loss. Please also try to make dietary changes to help manage your weight.  -GENERAL HEALTH MAINTENANCE: You are due for a diabetic eye exam and agreed to receive the Prevnar 20 vaccine today. Please schedule your eye exam at [facility].  INSTRUCTIONS:  Please schedule a follow-up appointment in 4 months and a blood pressure check with the clinical pharmacist in 1 month.

## 2023-07-26 ENCOUNTER — Ambulatory Visit: Payer: Self-pay | Admitting: Internal Medicine

## 2023-07-26 LAB — LIPID PANEL
Chol/HDL Ratio: 3.9 ratio (ref 0.0–5.0)
Cholesterol, Total: 138 mg/dL (ref 100–199)
HDL: 35 mg/dL — ABNORMAL LOW (ref >39)
LDL Chol Calc (NIH): 85 mg/dL (ref 0–99)
Triglycerides: 97 mg/dL (ref 0–149)
VLDL Cholesterol Cal: 18 mg/dL (ref 5–40)

## 2023-07-26 LAB — MICROALBUMIN / CREATININE URINE RATIO
Creatinine, Urine: 71.4 mg/dL
Microalb/Creat Ratio: 50 mg/g{creat} — ABNORMAL HIGH (ref 0–29)
Microalbumin, Urine: 35.9 ug/mL

## 2023-07-26 LAB — BASIC METABOLIC PANEL WITH GFR
BUN/Creatinine Ratio: 10 (ref 10–24)
BUN: 15 mg/dL (ref 8–27)
CO2: 22 mmol/L (ref 20–29)
Calcium: 10.1 mg/dL (ref 8.6–10.2)
Chloride: 101 mmol/L (ref 96–106)
Creatinine, Ser: 1.49 mg/dL — ABNORMAL HIGH (ref 0.76–1.27)
Glucose: 148 mg/dL — ABNORMAL HIGH (ref 70–99)
Potassium: 4.8 mmol/L (ref 3.5–5.2)
Sodium: 140 mmol/L (ref 134–144)
eGFR: 52 mL/min/1.73 — ABNORMAL LOW (ref >59)

## 2023-08-01 ENCOUNTER — Other Ambulatory Visit: Payer: Self-pay

## 2023-08-03 ENCOUNTER — Other Ambulatory Visit: Payer: Self-pay

## 2023-08-06 NOTE — Progress Notes (Deleted)
 Cardiology CONSULT Note    Date:  08/06/2023   ID:  Montario Zilka, DOB 1960-05-01, MRN [id number]  PCP:  Vicci Barnie NOVAK, MD  Cardiologist:  Wilbert Bihari, MD   No chief complaint on file.   Patient Profile: Lonnie Brown is a 63 y.o. male who is being seen today for the evaluation of chest pain and HTN at the request of Cardama, Raynell Moder,*.  History of Present Illness:  Bekim Werntz is a 63 y.o. male who is being seen today for the evaluation of chest pain and HTN at the request of Cardama, Raynell Moder,*.  This is a 63yo male with a hx of DM, HLD and HTN who was seen in the ER 02/17/2023 with CP that he described as a tightness that woke him up from sleep lasting 2 minutes and resolved after sitting up.  He had not had any exertional sx.  There were no associated sx of SOB, N/V or diaphoresis. hsTrop was normal x 2 and EKG without ischemia.  Felt to be GI in etiology.  He is now referred to Cardiology for further workup.    Past Medical History:  Diagnosis Date   Diabetes (HCC)    Hypercholesterolemia    Hypertension     Past Surgical History:  Procedure Laterality Date   EYE SURGERY     TONSILLECTOMY      Current Medications: No outpatient medications have been marked as taking for the 08/07/23 encounter (Appointment) with Bihari Wilbert SAUNDERS, MD.    Allergies:   Lisinopril  and Banana   Social History   Socioeconomic History   Marital status: Married    Spouse name: Not on file   Number of children: Not on file   Years of education: Not on file   Highest education level: Not on file  Occupational History   Not on file  Tobacco Use   Smoking status: Never   Smokeless tobacco: Never  Vaping Use   Vaping status: Never Used  Substance and Sexual Activity   Alcohol use: No   Drug use: No   Sexual activity: Yes    Birth control/protection: None  Other Topics Concern   Not on file  Social History Narrative   Not on file   Social Drivers of  Health   Financial Resource Strain: Low Risk  (07/24/2023)   Overall Financial Resource Strain (CARDIA)    Difficulty of Paying Living Expenses: Not hard at all  Food Insecurity: No Food Insecurity (07/24/2023)   Hunger Vital Sign    Worried About Running Out of Food in the Last Year: Never true    Ran Out of Food in the Last Year: Never true  Transportation Needs: No Transportation Needs (07/24/2023)   PRAPARE - Administrator, Civil Service (Medical): No    Lack of Transportation (Non-Medical): No  Physical Activity: Inactive (07/24/2023)   Exercise Vital Sign    Days of Exercise per Week: 0 days    Minutes of Exercise per Session: 0 min  Stress: No Stress Concern Present (07/24/2023)   [employer] of Occupational Health - Occupational Stress Questionnaire    Feeling of Stress: Not at all  Social Connections: Socially Integrated (07/24/2023)   Social Connection and Isolation Panel    Frequency of Communication with Friends and Family: Twice a week    Frequency of Social Gatherings with Friends and Family: Twice a week    Attends Religious Services: More than 4 times per year  Active Member of Clubs or Organizations: Yes    Attends Engineer, structural: More than 4 times per year    Marital Status: Married     Family History:  The patient's family history includes Alcoholism in his father; Diabetes in his mother; Hypertension in his mother.   ROS:   Please see the history of present illness.    ROS All other systems reviewed and are negative.      No data to display             PHYSICAL EXAM:   VS:  There were no vitals taken for this visit.   GEN: Well nourished, well developed, in no acute distress  HEENT: normal  Neck: no JVD, carotid bruits, or masses Cardiac: RRR; no murmurs, rubs, or gallops,no edema.  Intact distal pulses bilaterally.  Respiratory:  clear to auscultation bilaterally, normal work of breathing GI: soft, nontender,  nondistended, + BS MS: no deformity or atrophy  Skin: warm and dry, no rash Neuro:  Alert and Oriented x 3, Strength and sensation are intact Psych: euthymic mood, full affect  Wt Readings from Last 3 Encounters:  07/24/23 (!) 315 lb (142.9 kg)  05/25/23 (!) 313 lb 0.9 oz (142 kg)  04/14/23 (!) 313 lb 0.9 oz (142 kg)      Studies/Labs Reviewed:          Recent Labs: 04/14/2023: ALT 25; Hemoglobin 13.5; Platelets 234 07/24/2023: BUN 15; Creatinine, Ser 1.49; Potassium 4.8; Sodium 140   Lipid Panel    Component Value Date/Time   CHOL 138 07/24/2023 1133   TRIG 97 07/24/2023 1133   HDL 35 (L) 07/24/2023 1133   CHOLHDL 3.9 07/24/2023 1133   CHOLHDL 4.3 01/20/2019 1330   VLDL 14 01/20/2019 1330   LDLCALC 85 07/24/2023 1133   Additional studies/ records that were reviewed today include:  OV notes from PCP    ASSESSMENT:    1. Chest pain of uncertain etiology   2. Primary hypertension   3. Pure hypercholesterolemia      PLAN:  In order of problems listed above:  Chest pain -this occurred 5 months ago with negative workup in the ER -pain occurred while sleeping and only lasted 2 minutes and resolved spontaneously with no associated sx or radiation -felt to be GI -EKG and trop were normal -He has multiple CRFs including HTN, HLD, DM and therefore will set up for coronary CTA to define coronary anatomy. -check 2D echo to assess LVF  2.  HTN -BP controlled -continue Amlodipine  10mg  daily, hydrochlorothiazide  25mg  daily and spiro 25mg  daily with PRN refills  3.  HLD -LDL goal < 70 given DM -I have personally reviewed and interpreted outside labs performed by patient's PCP which showed LDL 85, HDL 35 on 07/24/2023 -will see what coronary CTA shows and if + for CAD will need more aggressive treatment of his HLD  Time Spent: 20 minutes total time of encounter, including 15 minutes spent in face-to-face patient care on the date of this encounter. This time includes  coordination of care and counseling regarding above mentioned problem list. Remainder of non-face-to-face time involved reviewing chart documents/testing relevant to the patient encounter and documentation in the medical record. I have independently reviewed documentation from referring provider  Followup:  ***  Medication Adjustments/Labs and Tests Ordered: Current medicines are reviewed at length with the patient today.  Concerns regarding medicines are outlined above.  Medication changes, Labs and Tests ordered today are listed in  the Patient Instructions below.  There are no Patient Instructions on file for this visit.   Signed, Wilbert Bihari, MD  08/06/2023 6:38 PM    [facility] [address] [place], [place], [place]  [address] Phone: [phone]; Fax: [phone]

## 2023-08-07 ENCOUNTER — Ambulatory Visit: Payer: Self-pay | Attending: Internal Medicine | Admitting: Cardiology

## 2023-08-07 DIAGNOSIS — E7800 Pure hypercholesterolemia, unspecified: Secondary | ICD-10-CM

## 2023-08-07 DIAGNOSIS — I10 Essential (primary) hypertension: Secondary | ICD-10-CM

## 2023-08-07 DIAGNOSIS — R079 Chest pain, unspecified: Secondary | ICD-10-CM

## 2023-08-09 ENCOUNTER — Other Ambulatory Visit: Payer: Self-pay

## 2023-08-21 ENCOUNTER — Ambulatory Visit: Payer: Self-pay | Admitting: Pharmacist

## 2023-09-05 NOTE — Progress Notes (Unsigned)
 Pt was recommended to follow up with his primary Dr. BP was 183/106.

## 2023-10-22 ENCOUNTER — Other Ambulatory Visit: Payer: Self-pay

## 2023-10-22 NOTE — Progress Notes (Signed)
 Pt attended 09/05/23 screening event where his bp was 183/106. Pt was recommended to follow up with his pcp. Chart review indicates that pt has a PCP Vicci Sober MD and has an appt coming up on 11/26/2023. Pt does not insurance listed and does not have any SDOH needs at this time. This CHW made first attempted call to pt to follow up on his potential insurance need.  Pt answered and stated that he knows he has an appt with his PCP on 11/26/2023 and confirmed that he is uninsured at this time. Pt will be 63 on 10/26/[birth year]23 and will be eligible for Medicare.  Letter sent to pt with medicare application resources and blood pressure educational material.   Future follow up to be scheduled per [facility] protocol.

## 2023-11-26 ENCOUNTER — Ambulatory Visit: Payer: Self-pay | Attending: Internal Medicine | Admitting: Internal Medicine

## 2023-11-26 ENCOUNTER — Telehealth: Payer: Self-pay | Admitting: Internal Medicine

## 2023-11-26 NOTE — Telephone Encounter (Signed)
 Called but no answer. LVM to call back.

## 2023-12-11 ENCOUNTER — Other Ambulatory Visit: Payer: Self-pay

## 2023-12-21 NOTE — Progress Notes (Signed)
 The patient attended a screening event on 09/05/2023 where his screening results were a BP of 183/106. At the event the patient did not list a PCP, did not list any insurance, and did not note if he is a smoker. Patient did not indicate any SDOH needs at the time of the event.   Per chart review patient has Lonnie Brown as his PCP, does not have any insurance listed, and does not have any SDOH needs. The patients last appt with his PCP was on 07/24/2023 and no showed his 11/26/2023 appt.  CHW attempted to reach out to patient but was unable to reach him, left a vm for patient to call back. Letter along with insurance and bp management resources mailed to patient. An additional follow up will be done in according to the health equity team's protocol.
